# Patient Record
Sex: Female | Born: 1985 | Race: White | Hispanic: No | Marital: Married | Smoking: Former smoker
Health system: Southern US, Community
[De-identification: ages and names within clinical notes are randomized; demographics above are authoritative.]

## PROBLEM LIST (undated history)

## (undated) DIAGNOSIS — Z332 Encounter for elective termination of pregnancy: Secondary | ICD-10-CM

## (undated) DIAGNOSIS — D649 Anemia, unspecified: Secondary | ICD-10-CM

## (undated) DIAGNOSIS — R7303 Prediabetes: Secondary | ICD-10-CM

## (undated) DIAGNOSIS — Z9889 Other specified postprocedural states: Secondary | ICD-10-CM

## (undated) DIAGNOSIS — D6852 Prothrombin gene mutation: Secondary | ICD-10-CM

## (undated) DIAGNOSIS — K589 Irritable bowel syndrome without diarrhea: Secondary | ICD-10-CM

## (undated) DIAGNOSIS — I8290 Acute embolism and thrombosis of unspecified vein: Secondary | ICD-10-CM

## (undated) DIAGNOSIS — R112 Nausea with vomiting, unspecified: Secondary | ICD-10-CM

## (undated) DIAGNOSIS — K760 Fatty (change of) liver, not elsewhere classified: Secondary | ICD-10-CM

## (undated) HISTORY — PX: TONSILLECTOMY AND ADENOIDECTOMY: SUR1326

## (undated) HISTORY — PX: UPPER GI ENDOSCOPY: SHX6162

## (undated) HISTORY — PX: OTHER SURGICAL HISTORY: SHX169

## (undated) HISTORY — PX: COLONOSCOPY: SHX174

## (undated) HISTORY — PX: KNEE ARTHROSCOPY: SUR90

## (undated) HISTORY — PX: HAND SURGERY: SHX662

---

## 2015-03-07 ENCOUNTER — Other Ambulatory Visit: Payer: Self-pay | Admitting: Family Medicine

## 2015-03-07 DIAGNOSIS — R7989 Other specified abnormal findings of blood chemistry: Secondary | ICD-10-CM

## 2015-03-07 DIAGNOSIS — R945 Abnormal results of liver function studies: Principal | ICD-10-CM

## 2015-03-14 ENCOUNTER — Ambulatory Visit
Admission: RE | Admit: 2015-03-14 | Discharge: 2015-03-14 | Disposition: A | Discharge status: Home / Self Care | Payer: 59 | Source: Ambulatory Visit | Attending: Family Medicine | Admitting: Family Medicine

## 2015-03-14 DIAGNOSIS — R7989 Other specified abnormal findings of blood chemistry: Secondary | ICD-10-CM

## 2015-03-14 DIAGNOSIS — R945 Abnormal results of liver function studies: Principal | ICD-10-CM

## 2016-07-02 ENCOUNTER — Other Ambulatory Visit: Payer: Self-pay | Admitting: Family Medicine

## 2016-07-02 DIAGNOSIS — Z8742 Personal history of other diseases of the female genital tract: Secondary | ICD-10-CM

## 2016-07-10 ENCOUNTER — Ambulatory Visit
Admission: RE | Admit: 2016-07-10 | Discharge: 2016-07-10 | Disposition: A | Discharge status: Home / Self Care | Payer: 59 | Source: Ambulatory Visit | Attending: Family Medicine | Admitting: Family Medicine

## 2016-07-10 DIAGNOSIS — Z8742 Personal history of other diseases of the female genital tract: Secondary | ICD-10-CM

## 2016-07-16 ENCOUNTER — Encounter: Payer: Self-pay | Admitting: Hematology

## 2016-07-16 ENCOUNTER — Telehealth: Payer: Self-pay | Admitting: Hematology

## 2016-07-16 NOTE — Telephone Encounter (Signed)
Pt confirmed appt, verified demo and insurance, mailed pt letter, faxed referring provider appt date/time. °

## 2016-07-17 ENCOUNTER — Other Ambulatory Visit: Payer: Self-pay | Admitting: Family Medicine

## 2016-07-17 DIAGNOSIS — N83201 Unspecified ovarian cyst, right side: Secondary | ICD-10-CM

## 2016-08-21 ENCOUNTER — Ambulatory Visit (HOSPITAL_BASED_OUTPATIENT_CLINIC_OR_DEPARTMENT_OTHER): Payer: 59 | Admitting: Hematology

## 2016-08-21 ENCOUNTER — Telehealth: Payer: Self-pay | Admitting: Hematology

## 2016-08-21 ENCOUNTER — Ambulatory Visit (HOSPITAL_BASED_OUTPATIENT_CLINIC_OR_DEPARTMENT_OTHER): Payer: 59

## 2016-08-21 VITALS — BP 136/69 | HR 76 | Temp 98.4°F | Resp 18 | Ht 69.0 in | Wt 290.9 lb

## 2016-08-21 DIAGNOSIS — N920 Excessive and frequent menstruation with regular cycle: Secondary | ICD-10-CM | POA: Diagnosis not present

## 2016-08-21 DIAGNOSIS — D500 Iron deficiency anemia secondary to blood loss (chronic): Secondary | ICD-10-CM | POA: Diagnosis not present

## 2016-08-21 DIAGNOSIS — R58 Hemorrhage, not elsewhere classified: Secondary | ICD-10-CM | POA: Insufficient documentation

## 2016-08-21 DIAGNOSIS — Z862 Personal history of diseases of the blood and blood-forming organs and certain disorders involving the immune mechanism: Secondary | ICD-10-CM | POA: Diagnosis not present

## 2016-08-21 DIAGNOSIS — R5383 Other fatigue: Secondary | ICD-10-CM

## 2016-08-21 DIAGNOSIS — D6852 Prothrombin gene mutation: Secondary | ICD-10-CM

## 2016-08-21 LAB — COMPREHENSIVE METABOLIC PANEL
ALBUMIN: 3.6 g/dL (ref 3.5–5.0)
ALK PHOS: 70 U/L (ref 40–150)
ALT: 87 U/L — AB (ref 0–55)
ANION GAP: 9 meq/L (ref 3–11)
AST: 60 U/L — ABNORMAL HIGH (ref 5–34)
BILIRUBIN TOTAL: 0.54 mg/dL (ref 0.20–1.20)
BUN: 11.7 mg/dL (ref 7.0–26.0)
CALCIUM: 8.7 mg/dL (ref 8.4–10.4)
CO2: 23 meq/L (ref 22–29)
CREATININE: 0.7 mg/dL (ref 0.6–1.1)
Chloride: 108 mEq/L (ref 98–109)
Glucose: 92 mg/dl (ref 70–140)
Potassium: 4 mEq/L (ref 3.5–5.1)
Sodium: 140 mEq/L (ref 136–145)
TOTAL PROTEIN: 6.7 g/dL (ref 6.4–8.3)

## 2016-08-21 LAB — IRON AND TIBC
%SAT: 3 % — ABNORMAL LOW (ref 21–57)
Iron: 14 ug/dL — ABNORMAL LOW (ref 41–142)
TIBC: 459 ug/dL — AB (ref 236–444)
UIBC: 444 ug/dL — AB (ref 120–384)

## 2016-08-21 LAB — CBC & DIFF AND RETIC
BASO%: 0.9 % (ref 0.0–2.0)
Basophils Absolute: 0.1 10*3/uL (ref 0.0–0.1)
EOS ABS: 0.3 10*3/uL (ref 0.0–0.5)
EOS%: 3.7 % (ref 0.0–7.0)
HEMATOCRIT: 22.5 % — AB (ref 34.8–46.6)
HEMOGLOBIN: 7 g/dL — AB (ref 11.6–15.9)
IMMATURE RETIC FRACT: 19.7 % — AB (ref 1.60–10.00)
LYMPH%: 34.9 % (ref 14.0–49.7)
MCH: 23.3 pg — ABNORMAL LOW (ref 25.1–34.0)
MCHC: 31.1 g/dL — ABNORMAL LOW (ref 31.5–36.0)
MCV: 74.8 fL — AB (ref 79.5–101.0)
MONO#: 0.6 10*3/uL (ref 0.1–0.9)
MONO%: 7 % (ref 0.0–14.0)
NEUT%: 53.5 % (ref 38.4–76.8)
NEUTROS ABS: 4.6 10*3/uL (ref 1.5–6.5)
PLATELETS: 285 10*3/uL (ref 145–400)
RBC: 3.01 10*6/uL — ABNORMAL LOW (ref 3.70–5.45)
RDW: 14.2 % (ref 11.2–14.5)
RETIC CT ABS: 85.18 10*3/uL (ref 33.70–90.70)
Retic %: 2.83 % — ABNORMAL HIGH (ref 0.70–2.10)
WBC: 8.6 10*3/uL (ref 3.9–10.3)
lymph#: 3 10*3/uL (ref 0.9–3.3)
nRBC: 0 % (ref 0–0)

## 2016-08-21 LAB — FERRITIN: FERRITIN: 10 ng/mL (ref 9–269)

## 2016-08-21 LAB — TSH: TSH: 1.067 m(IU)/L (ref 0.308–3.960)

## 2016-08-21 NOTE — Telephone Encounter (Signed)
Appointments scheduled per 12/7 LOS. Patient given AVS report and calendars with future scheduled appointments.  °

## 2016-08-22 LAB — VON WILLEBRAND PANEL
FACTOR VIII ACTIVITY: 196 % — AB (ref 57–163)
vWF Activity: 85 % (ref 50–200)
von Willebrand Factor (vWF) Ag: 108 % (ref 50–200)

## 2016-08-22 LAB — APTT: aPTT: 25 s (ref 24–33)

## 2016-08-22 LAB — PROTHROMBIN TIME (PT)
INR: 1.1 (ref 0.8–1.2)
PROTHROMBIN TIME: 12.1 s — AB (ref 9.1–12.0)

## 2016-08-25 ENCOUNTER — Other Ambulatory Visit: Payer: Self-pay | Admitting: *Deleted

## 2016-08-25 DIAGNOSIS — D500 Iron deficiency anemia secondary to blood loss (chronic): Secondary | ICD-10-CM | POA: Insufficient documentation

## 2016-08-25 DIAGNOSIS — D6852 Prothrombin gene mutation: Secondary | ICD-10-CM | POA: Insufficient documentation
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
## 2016-08-29 ENCOUNTER — Ambulatory Visit (HOSPITAL_BASED_OUTPATIENT_CLINIC_OR_DEPARTMENT_OTHER): Payer: 59

## 2016-08-29 VITALS — BP 121/61 | HR 88 | Temp 98.6°F | Resp 18

## 2016-08-29 DIAGNOSIS — D500 Iron deficiency anemia secondary to blood loss (chronic): Secondary | ICD-10-CM

## 2016-08-29 DIAGNOSIS — N920 Excessive and frequent menstruation with regular cycle: Secondary | ICD-10-CM | POA: Diagnosis not present

## 2016-08-29 MED ORDER — SODIUM CHLORIDE 0.9 % IV SOLN
750.0000 mg | Freq: Once | INTRAVENOUS | Status: AC
  Administered 2016-08-29: 750 mg via INTRAVENOUS
  Filled 2016-08-29: qty 15

## 2016-08-29 MED ORDER — SODIUM CHLORIDE 0.9 % IV SOLN
Freq: Once | INTRAVENOUS | Status: AC
  Administered 2016-08-29: 09:00:00 via INTRAVENOUS

## 2016-08-29 NOTE — Patient Instructions (Signed)
Ferric carboxymaltose injection What is this medicine? FERRIC CARBOXYMALTOSE (ferr-ik car-box-ee-mol-toes) is an iron complex. Iron is used to make healthy red blood cells, which carry oxygen and nutrients throughout the body. This medicine is used to treat anemia in people with chronic kidney disease or people who cannot take iron by mouth. COMMON BRAND NAME(S): Injectafer What should I tell my health care provider before I take this medicine? They need to know if you have any of these conditions: -anemia not caused by low iron levels -high levels of iron in the blood -liver disease -an unusual or allergic reaction to iron, other medicines, foods, dyes, or preservatives -pregnant or trying to get pregnant -breast-feeding How should I use this medicine? This medicine is for infusion into a vein. It is given by a health care professional in a hospital or clinic setting. Talk to your pediatrician regarding the use of this medicine in children. Special care may be needed. What if I miss a dose? It is important not to miss your dose. Call your doctor or health care professional if you are unable to keep an appointment. What may interact with this medicine? Do not take this medicine with any of the following medications: -deferoxamine -dimercaprol -other iron products This medicine may also interact with the following medications: -chloramphenicol -deferasirox What should I watch for while using this medicine? Visit your doctor or health care professional regularly. Tell your doctor if your symptoms do not start to get better or if they get worse. You may need blood work done while you are taking this medicine. You may need to follow a special diet. Talk to your doctor. Foods that contain iron include: whole grains/cereals, dried fruits, beans, or peas, leafy green vegetables, and organ meats (liver, kidney). What side effects may I notice from receiving this medicine? Side effects that you  should report to your doctor or health care professional as soon as possible: -allergic reactions like skin rash, itching or hives, swelling of the face, lips, or tongue -breathing problems -changes in blood pressure -feeling faint or lightheaded, falls -flushing, sweating, or hot feelings Side effects that usually do not require medical attention (report to your doctor or health care professional if they continue or are bothersome): -changes in taste -constipation -dizziness -headache -nausea -pain, redness, or irritation at site where injected -vomiting Where should I keep my medicine? This drug is given in a hospital or clinic and will not be stored at home.  2017 Elsevier/Gold Standard (2015-10-04 11:20:47)  

## 2016-08-29 NOTE — Progress Notes (Signed)
Pt tolerated infusion well. Pt monitored for 30 minutes. Pt and VS stable at discharge.

## 2016-09-05 ENCOUNTER — Ambulatory Visit (HOSPITAL_BASED_OUTPATIENT_CLINIC_OR_DEPARTMENT_OTHER): Payer: 59

## 2016-09-05 VITALS — BP 123/64 | HR 79 | Temp 98.9°F | Resp 18

## 2016-09-05 DIAGNOSIS — N920 Excessive and frequent menstruation with regular cycle: Secondary | ICD-10-CM | POA: Diagnosis not present

## 2016-09-05 DIAGNOSIS — D500 Iron deficiency anemia secondary to blood loss (chronic): Secondary | ICD-10-CM

## 2016-09-05 MED ORDER — SODIUM CHLORIDE 0.9 % IV SOLN
Freq: Once | INTRAVENOUS | Status: AC
  Administered 2016-09-05: 09:00:00 via INTRAVENOUS

## 2016-09-05 MED ORDER — SODIUM CHLORIDE 0.9 % IV SOLN
750.0000 mg | Freq: Once | INTRAVENOUS | Status: AC
  Administered 2016-09-05: 750 mg via INTRAVENOUS
  Filled 2016-09-05: qty 15

## 2016-09-05 NOTE — Patient Instructions (Signed)
Ferric carboxymaltose injection What is this medicine? FERRIC CARBOXYMALTOSE (ferr-ik car-box-ee-mol-toes) is an iron complex. Iron is used to make healthy red blood cells, which carry oxygen and nutrients throughout the body. This medicine is used to treat anemia in people with chronic kidney disease or people who cannot take iron by mouth. COMMON BRAND NAME(S): Injectafer What should I tell my health care provider before I take this medicine? They need to know if you have any of these conditions: -anemia not caused by low iron levels -high levels of iron in the blood -liver disease -an unusual or allergic reaction to iron, other medicines, foods, dyes, or preservatives -pregnant or trying to get pregnant -breast-feeding How should I use this medicine? This medicine is for infusion into a vein. It is given by a health care professional in a hospital or clinic setting. Talk to your pediatrician regarding the use of this medicine in children. Special care may be needed. What if I miss a dose? It is important not to miss your dose. Call your doctor or health care professional if you are unable to keep an appointment. What may interact with this medicine? Do not take this medicine with any of the following medications: -deferoxamine -dimercaprol -other iron products This medicine may also interact with the following medications: -chloramphenicol -deferasirox What should I watch for while using this medicine? Visit your doctor or health care professional regularly. Tell your doctor if your symptoms do not start to get better or if they get worse. You may need blood work done while you are taking this medicine. You may need to follow a special diet. Talk to your doctor. Foods that contain iron include: whole grains/cereals, dried fruits, beans, or peas, leafy green vegetables, and organ meats (liver, kidney). What side effects may I notice from receiving this medicine? Side effects that you  should report to your doctor or health care professional as soon as possible: -allergic reactions like skin rash, itching or hives, swelling of the face, lips, or tongue -breathing problems -changes in blood pressure -feeling faint or lightheaded, falls -flushing, sweating, or hot feelings Side effects that usually do not require medical attention (report to your doctor or health care professional if they continue or are bothersome): -changes in taste -constipation -dizziness -headache -nausea -pain, redness, or irritation at site where injected -vomiting Where should I keep my medicine? This drug is given in a hospital or clinic and will not be stored at home.  2017 Elsevier/Gold Standard (2015-10-04 11:20:47)  

## 2016-09-05 NOTE — Progress Notes (Signed)
Pt tolerated infusion well. Pt monitored 30 minutes post infusion. Pt and VS stable at discharge.  

## 2016-09-18 ENCOUNTER — Other Ambulatory Visit: Payer: 59

## 2016-09-23 ENCOUNTER — Other Ambulatory Visit: Payer: 59

## 2016-09-23 ENCOUNTER — Ambulatory Visit: Payer: 59 | Admitting: Hematology

## 2016-11-24 ENCOUNTER — Other Ambulatory Visit: Payer: 59

## 2016-11-26 ENCOUNTER — Other Ambulatory Visit: Payer: 59

## 2016-11-27 ENCOUNTER — Ambulatory Visit
Admission: RE | Admit: 2016-11-27 | Discharge: 2016-11-27 | Disposition: A | Discharge status: Home / Self Care | Payer: Managed Care, Other (non HMO) | Source: Ambulatory Visit | Attending: Family Medicine | Admitting: Family Medicine

## 2016-11-27 DIAGNOSIS — N83201 Unspecified ovarian cyst, right side: Secondary | ICD-10-CM

## 2017-07-16 ENCOUNTER — Encounter (HOSPITAL_COMMUNITY): Payer: Self-pay | Admitting: *Deleted

## 2017-07-20 NOTE — H&P (Addendum)
Leslie Branch is an 31 y.o. female. Here with a MAB (fetus prior had FHT not absent). She has a h/o Prothrombin gene mutation and has been on lovenox. Lovenox was discontinued >5 days ago.   Pertinent Gynecological History: Menses: flow is moderate Bleeding: n/a Contraception: none DES exposure: denies Blood transfusions: none Sexually transmitted diseases: no past history Previous GYN Procedures: n/A  Last pap: normal Date: 2016 OB History: G4, P0   Menstrual History: Menarche age: n/a Patient's last menstrual period was 05/18/2017 (approximate).    Past Medical History:  Diagnosis Date  . Anemia   . Blood clot in vein    right hand - thumb  . IBS (irritable bowel syndrome)   . 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 Termination of pregnancy (fetus)    x 3 in clinics    Past Surgical History:  Procedure Laterality Date  . COLONOSCOPY    . HAND SURGERY Left    pinky - reconstructive  . KNEE ARTHROSCOPY Left   . TONSILLECTOMY AND ADENOIDECTOMY     as a child  . tubes in ears     as a child  . UPPER GI ENDOSCOPY      History reviewed. No pertinent family history.  Social History:  reports that she quit smoking about 6 years ago. Her smoking use included cigarettes. She has a 10.00 pack-year smoking history. she has never used smokeless tobacco. She reports that she does not drink alcohol or use drugs.  Allergies:  Allergies  Allergen Reactions  . Chloraseptic Sore Throat [Acetaminophen] Anaphylaxis    Closed throat when used spray    No medications prior to admission.    ROS  Height 5\' 9"  (1.753 m), weight 122.5 kg (270 lb), last menstrual period 05/18/2017. Physical Exam (from office) Gen: well appearing, NAD CV: Reg rate Pulm: NWOB Abd: soft, nondistended, nontender, no mass GYN: uterus 9 week size, no adnexa ttp/CMT Ext: No edema b/l  No results found for this or any previous visit (from the past 24 hour(s)).  No results  found.  Assessment/Plan: 31 yo G4P0 presenting for MAB @ ~7.2wga. She has a h/o Prothrombin gene mutation with DVT in the past. Had been on lovenox this pregnancy but that was discontinued on 07/16/17. She is RH+. All options were discussed for MAB and pt opted D&E with genetics. Risks discussed including infection, bleeding, damage to surrounding structures, need for additional procedures, and failure of procedure. All questions answered. Consent signed. Proceed with above surgery. Doxycycline 100mg  BID x 3 days. EL   Belva AgeeElise Tahlor Berenguer MD   Madelaine EtienneElise Jennifer Nohelani Benning 07/20/2017, 9:56 AM   No updates to above H&P. Patient arrived NPO and was consented in PACU. Risks discussed including infection, bleeding, damage to surrounding structures, need for additional procedures, and failure of procedure. All questions answered. Consent signed. Proceed with above surgery.   Belva AgeeElise Aubriana Ravelo MD

## 2017-07-22 ENCOUNTER — Ambulatory Visit (HOSPITAL_COMMUNITY): Payer: Managed Care, Other (non HMO) | Admitting: Anesthesiology

## 2017-07-22 ENCOUNTER — Ambulatory Visit (HOSPITAL_COMMUNITY)
Admission: RE | Admit: 2017-07-22 | Discharge: 2017-07-22 | Disposition: A | Discharge status: Home / Self Care | Payer: Managed Care, Other (non HMO) | Source: Ambulatory Visit | Attending: Obstetrics and Gynecology | Admitting: Obstetrics and Gynecology

## 2017-07-22 ENCOUNTER — Encounter (HOSPITAL_COMMUNITY)
Admission: RE | Disposition: A | Discharge status: Home / Self Care | Payer: Self-pay | Source: Ambulatory Visit | Attending: Obstetrics and Gynecology

## 2017-07-22 ENCOUNTER — Encounter (HOSPITAL_COMMUNITY): Payer: Self-pay | Admitting: Emergency Medicine

## 2017-07-22 DIAGNOSIS — O021 Missed abortion: Secondary | ICD-10-CM | POA: Insufficient documentation

## 2017-07-22 DIAGNOSIS — Z7901 Long term (current) use of anticoagulants: Secondary | ICD-10-CM | POA: Diagnosis not present

## 2017-07-22 DIAGNOSIS — Z86718 Personal history of other venous thrombosis and embolism: Secondary | ICD-10-CM | POA: Insufficient documentation

## 2017-07-22 DIAGNOSIS — Z87891 Personal history of nicotine dependence: Secondary | ICD-10-CM | POA: Insufficient documentation

## 2017-07-22 DIAGNOSIS — K589 Irritable bowel syndrome without diarrhea: Secondary | ICD-10-CM | POA: Insufficient documentation

## 2017-07-22 HISTORY — PX: DILATION AND EVACUATION: SHX1459

## 2017-07-22 HISTORY — DX: Encounter for elective termination of pregnancy: Z33.2

## 2017-07-22 HISTORY — DX: Acute embolism and thrombosis of unspecified vein: I82.90

## 2017-07-22 HISTORY — DX: Prothrombin gene mutation: D68.52

## 2017-07-22 HISTORY — DX: Irritable bowel syndrome, unspecified: K58.9

## 2017-07-22 HISTORY — DX: Anemia, unspecified: D64.9

## 2017-07-22 LAB — CBC
HCT: 39.3 % (ref 36.0–46.0)
HEMOGLOBIN: 13.5 g/dL (ref 12.0–15.0)
MCH: 29.3 pg (ref 26.0–34.0)
MCHC: 34.4 g/dL (ref 30.0–36.0)
MCV: 85.4 fL (ref 78.0–100.0)
Platelets: 307 10*3/uL (ref 150–400)
RBC: 4.6 MIL/uL (ref 3.87–5.11)
RDW: 13.2 % (ref 11.5–15.5)
WBC: 9.6 10*3/uL (ref 4.0–10.5)

## 2017-07-22 LAB — TYPE AND SCREEN
ABO/RH(D): O POS
Antibody Screen: NEGATIVE

## 2017-07-22 LAB — ABO/RH: ABO/RH(D): O POS

## 2017-07-22 SURGERY — DILATION AND EVACUATION, UTERUS
Anesthesia: Monitor Anesthesia Care | Site: Vagina

## 2017-07-22 MED ORDER — PROPOFOL 500 MG/50ML IV EMUL
INTRAVENOUS | Status: DC | PRN
  Administered 2017-07-22: 150 ug/kg/min via INTRAVENOUS

## 2017-07-22 MED ORDER — MIDAZOLAM HCL 2 MG/2ML IJ SOLN
INTRAMUSCULAR | Status: AC
  Filled 2017-07-22: qty 2

## 2017-07-22 MED ORDER — MIDAZOLAM HCL 2 MG/2ML IJ SOLN
INTRAMUSCULAR | Status: DC | PRN
  Administered 2017-07-22: 2 mg via INTRAVENOUS

## 2017-07-22 MED ORDER — FENTANYL CITRATE (PF) 100 MCG/2ML IJ SOLN
INTRAMUSCULAR | Status: AC
  Filled 2017-07-22: qty 2

## 2017-07-22 MED ORDER — DOXYCYCLINE HYCLATE 100 MG PO CAPS: 100.0000 mg | ORAL_CAPSULE | Freq: Two times a day (BID) | ORAL | 0 refills | Status: DC

## 2017-07-22 MED ORDER — SCOPOLAMINE 1 MG/3DAYS TD PT72
MEDICATED_PATCH | TRANSDERMAL | Status: AC
  Administered 2017-07-22: 1.5 mg via TRANSDERMAL
  Filled 2017-07-22: qty 1

## 2017-07-22 MED ORDER — SCOPOLAMINE 1 MG/3DAYS TD PT72
1.0000 | MEDICATED_PATCH | Freq: Once | TRANSDERMAL | Status: DC
  Administered 2017-07-22: 1.5 mg via TRANSDERMAL

## 2017-07-22 MED ORDER — PROPOFOL 10 MG/ML IV BOLUS
INTRAVENOUS | Status: AC
  Filled 2017-07-22: qty 40

## 2017-07-22 MED ORDER — ONDANSETRON HCL 4 MG/2ML IJ SOLN
INTRAMUSCULAR | Status: DC | PRN
  Administered 2017-07-22: 4 mg via INTRAVENOUS

## 2017-07-22 MED ORDER — CHLOROPROCAINE HCL 1 % IJ SOLN
INTRAMUSCULAR | Status: AC
  Filled 2017-07-22: qty 30

## 2017-07-22 MED ORDER — LIDOCAINE HCL (CARDIAC) 20 MG/ML IV SOLN
INTRAVENOUS | Status: AC
  Filled 2017-07-22: qty 5

## 2017-07-22 MED ORDER — LIDOCAINE HCL (CARDIAC) 20 MG/ML IV SOLN
INTRAVENOUS | Status: DC | PRN
  Administered 2017-07-22: 60 mg via INTRAVENOUS

## 2017-07-22 MED ORDER — FENTANYL CITRATE (PF) 100 MCG/2ML IJ SOLN
INTRAMUSCULAR | Status: DC | PRN
  Administered 2017-07-22: 100 ug via INTRAVENOUS

## 2017-07-22 MED ORDER — CHLOROPROCAINE HCL 1 % IJ SOLN
INTRAMUSCULAR | Status: DC | PRN
  Administered 2017-07-22: 10 mL

## 2017-07-22 MED ORDER — LACTATED RINGERS IV SOLN
INTRAVENOUS | Status: DC
  Administered 2017-07-22: 07:00:00 via INTRAVENOUS

## 2017-07-22 MED ORDER — DEXAMETHASONE SODIUM PHOSPHATE 4 MG/ML IJ SOLN
INTRAMUSCULAR | Status: AC
  Filled 2017-07-22: qty 1

## 2017-07-22 MED ORDER — OXYCODONE HCL 5 MG PO TABS: 5.0000 mg | ORAL_TABLET | Freq: Four times a day (QID) | ORAL | 0 refills | Status: DC | PRN

## 2017-07-22 MED ORDER — ONDANSETRON HCL 4 MG/2ML IJ SOLN
INTRAMUSCULAR | Status: AC
  Filled 2017-07-22: qty 2

## 2017-07-22 MED ORDER — SCOPOLAMINE 1 MG/3DAYS TD PT72
MEDICATED_PATCH | TRANSDERMAL | Status: AC
  Filled 2017-07-22: qty 1

## 2017-07-22 MED ORDER — DEXAMETHASONE SODIUM PHOSPHATE 10 MG/ML IJ SOLN
INTRAMUSCULAR | Status: DC | PRN
  Administered 2017-07-22: 4 mg via INTRAVENOUS

## 2017-07-22 SURGICAL SUPPLY — 18 items
CATH ROBINSON RED A/P 16FR (CATHETERS) ×3 IMPLANT
DECANTER SPIKE VIAL GLASS SM (MISCELLANEOUS) ×3 IMPLANT
GLOVE BIO SURGEON STRL SZ 6.5 (GLOVE) ×2 IMPLANT
GLOVE BIO SURGEONS STRL SZ 6.5 (GLOVE) ×1
GLOVE BIOGEL PI IND STRL 7.0 (GLOVE) ×2 IMPLANT
GLOVE BIOGEL PI INDICATOR 7.0 (GLOVE) ×4
GOWN STRL REUS W/TWL LRG LVL3 (GOWN DISPOSABLE) ×6 IMPLANT
KIT BERKELEY 1ST TRIMESTER 3/8 (MISCELLANEOUS) ×3 IMPLANT
NS IRRIG 1000ML POUR BTL (IV SOLUTION) ×3 IMPLANT
PACK VAGINAL MINOR WOMEN LF (CUSTOM PROCEDURE TRAY) ×3 IMPLANT
PAD OB MATERNITY 4.3X12.25 (PERSONAL CARE ITEMS) ×3 IMPLANT
PAD PREP 24X48 CUFFED NSTRL (MISCELLANEOUS) ×3 IMPLANT
SET BERKELEY SUCTION TUBING (SUCTIONS) ×3 IMPLANT
TOWEL OR 17X24 6PK STRL BLUE (TOWEL DISPOSABLE) ×6 IMPLANT
VACURETTE 10 RIGID CVD (CANNULA) IMPLANT
VACURETTE 7MM CVD STRL WRAP (CANNULA) ×3 IMPLANT
VACURETTE 8 RIGID CVD (CANNULA) IMPLANT
VACURETTE 9 RIGID CVD (CANNULA) IMPLANT

## 2017-07-22 NOTE — Anesthesia Postprocedure Evaluation (Signed)
Anesthesia Post Note  Patient: Leslie Branch  Procedure(s) Performed: DILATATION AND EVACUATION WITH GENETICS (N/A Vagina )     Patient location during evaluation: PACU Anesthesia Type: MAC Level of consciousness: awake and alert Pain management: pain level controlled Vital Signs Assessment: post-procedure vital signs reviewed and stable Respiratory status: spontaneous breathing, nonlabored ventilation and respiratory function stable Cardiovascular status: stable and blood pressure returned to baseline Postop Assessment: no apparent nausea or vomiting Anesthetic complications: no    Last Vitals:  Vitals:   07/22/17 0820 07/22/17 0900  BP: 139/70 (!) 147/92  Pulse: 86 81  Resp: 19 18  Temp:  36.7 C  SpO2: 97% 100%    Last Pain:  Vitals:   07/22/17 0820  TempSrc:   PainSc: 2    Pain Goal: Patients Stated Pain Goal: 3 (07/22/17 0644)               Catalina Gravel

## 2017-07-22 NOTE — Transfer of Care (Signed)
Immediate Anesthesia Transfer of Care Note  Patient: Leslie Branch  Procedure(s) Performed: DILATATION AND EVACUATION WITH GENETICS (N/A Vagina )  Patient Location: PACU  Anesthesia Type:MAC  Level of Consciousness: awake, alert  and oriented  Airway & Oxygen Therapy: Patient Spontanous Breathing  Post-op Assessment: Report given to RN and Post -op Vital signs reviewed and stable  Post vital signs: Reviewed and stable  Last Vitals:  Vitals:   07/22/17 0644  BP: 137/79  Pulse: 81  Resp: 16  Temp: 36.7 C  SpO2: 100%    Last Pain:  Vitals:   07/22/17 0644  TempSrc: Oral      Patients Stated Pain Goal: 3 (09/81/19 1478)  Complications: No apparent anesthesia complications

## 2017-07-22 NOTE — Op Note (Signed)
PREOPERATIVE DIAGNOSES: 1. Missed Abortion  POSTOPERATIVE DIAGNOSES: Same  PROCEDURE PERFORMED: Dilation, suction, sharp curretage  SURGEON: Dr. Belva AgeeElise Anastacio Bua  ANESTHESIA: Paracervical block and IV sedation  ESTIMATED BLOOD LOSS: 20cc.  COMPLICATIONS: None  TUBES: None.  DRAINS: None  PATHOLOGY: Endometrial curettings for chromosomal analysis  FINDINGS: On exam, under anesthesia, normal appearing vulva and vagina, 8 week sized uterus  Operative findings demonstrated plethora of POCs  Procedure: The patient was taken to the operating room where she was properly prepped and draped in sterile manner under general anesthesia. After bimanual examination, the cervix was exposed with a speculum and the anterior lip of the cervix grasped with a tenaculum. Paracervical block performed. The endocervical canal was then progressively dilated to 7mm. Suction catheter was introduced into the uterus and to the uterine fundus. The uterus was evacuated and good tissue return was noted. A sharp curettage was then performed until gritty texture noted. All instruments were removed from vagina. The sponge and lap counts were correct times 2 at this time. The patient's procedure was terminated. We then awakened her. She was sent to the Recovery Room in good condition.    Belva AgeeElise Blasa Raisch MD

## 2017-07-22 NOTE — Brief Op Note (Signed)
07/22/2017  7:56 AM  PATIENT:  Leslie Branch  31 y.o. female  PRE-OPERATIVE DIAGNOSIS:  MISSED AB  POST-OPERATIVE DIAGNOSIS:  Missed AB  PROCEDURE:  Procedure(s): DILATATION AND EVACUATION WITH GENETICS (N/A)  SURGEON:  Surgeon(s) and Role:    * Ranae PilaLeger, Keiyana Stehr Jennifer, MD - Primary  PHYSICIAN ASSISTANT:   ASSISTANTS: none   ANESTHESIA:   IV sedation and paracervical block  EBL:  30 mL   BLOOD ADMINISTERED:none  DRAINS: none   LOCAL MEDICATIONS USED:  LIDOCAINE  and Amount: 10 ml  SPECIMEN:  Source of Specimen:  POC  DISPOSITION OF SPECIMEN:  PATHOLOGY  COUNTS:  YES  TOURNIQUET:  * No tourniquets in log *  DICTATION: .Note written in EPIC  PLAN OF CARE: Discharge to home after PACU  PATIENT DISPOSITION:  PACU - hemodynamically stable.   Delay start of Pharmacological VTE agent (>24hrs) due to surgical blood loss or risk of bleeding: not applicable

## 2017-07-22 NOTE — Discharge Instructions (Signed)

## 2017-07-22 NOTE — Anesthesia Preprocedure Evaluation (Signed)
Anesthesia Evaluation  Patient identified by MRN, date of birth, ID band Patient awake    Reviewed: Allergy & Precautions, NPO status , Patient's Chart, lab work & pertinent test results  Airway Mallampati: II  TM Distance: >3 FB Neck ROM: Full    Dental  (+) Teeth Intact, Dental Advisory Given   Pulmonary former smoker,    Pulmonary exam normal breath sounds clear to auscultation       Cardiovascular Normal cardiovascular exam Rhythm:Regular Rate:Normal     Neuro/Psych negative neurological ROS  negative psych ROS   GI/Hepatic negative GI ROS, Neg liver ROS,   Endo/Other  Obesity   Renal/GU negative Renal ROS     Musculoskeletal Blood clot in vein right thumb   Abdominal   Peds  Hematology Prothrombin gene mutation   Anesthesia Other Findings Day of surgery medications reviewed with the patient.  Reproductive/Obstetrics 31 yo G4P0 presenting for MAB @ ~7.2wga                             Anesthesia Physical Anesthesia Plan  ASA: II  Anesthesia Plan: MAC   Post-op Pain Management:    Induction: Intravenous  PONV Risk Score and Plan: 2  Airway Management Planned: Simple Face Mask  Additional Equipment:   Intra-op Plan:   Post-operative Plan:   Informed Consent: I have reviewed the patients History and Physical, chart, labs and discussed the procedure including the risks, benefits and alternatives for the proposed anesthesia with the patient or authorized representative who has indicated his/her understanding and acceptance.   Dental advisory given  Plan Discussed with: CRNA  Anesthesia Plan Comments: (Discussed risks/benefits/alternatives to MAC sedation including need for ventilatory support, hypotension, need for conversion to general anesthesia.  All patient questions answered.  Patient/guardian wishes to proceed.)        Anesthesia Quick Evaluation

## 2017-07-23 ENCOUNTER — Encounter (HOSPITAL_COMMUNITY): Payer: Self-pay | Admitting: Obstetrics and Gynecology

## 2017-08-04 LAB — CHROMOSOME STD, POC(TISSUE)-NCBH

## 2017-12-02 ENCOUNTER — Ambulatory Visit: Payer: Self-pay

## 2017-12-02 ENCOUNTER — Ambulatory Visit (INDEPENDENT_AMBULATORY_CARE_PROVIDER_SITE_OTHER): Payer: Managed Care, Other (non HMO) | Admitting: Sports Medicine

## 2017-12-02 ENCOUNTER — Encounter: Payer: Self-pay | Admitting: Sports Medicine

## 2017-12-02 VITALS — BP 120/84 | HR 80 | Ht 69.0 in | Wt 288.4 lb

## 2017-12-02 DIAGNOSIS — M722 Plantar fascial fibromatosis: Secondary | ICD-10-CM | POA: Insufficient documentation

## 2017-12-02 DIAGNOSIS — M79672 Pain in left foot: Secondary | ICD-10-CM

## 2017-12-02 MED ORDER — IBUPROFEN-FAMOTIDINE 800-26.6 MG PO TABS: 1.0000 | ORAL_TABLET | Freq: Three times a day (TID) | ORAL | 2 refills | Status: DC | PRN

## 2017-12-02 MED ORDER — IBUPROFEN-FAMOTIDINE 800-26.6 MG PO TABS: 1.0000 | ORAL_TABLET | Freq: Three times a day (TID) | ORAL | 0 refills | Status: AC | PRN

## 2017-12-02 NOTE — Progress Notes (Signed)
  Leslie ShySarah Holsonback - 32 y.o. female MRN [id number]  Date of birth: 12/20/1985  Scribe for today's visit: Stevenson ClinchBrandy Coleman, CMA     SUBJECTIVE:  Leslie Branch is here for No chief complaint on file.  Her L heel pain symptoms INITIALLY: Began a couple of weeks ago and started after a long run.  Described as moderate (6-7/10) stabbing, feels like a bruise, nonradiating Worsened with weightbearing. Initially the pain worse only after a long run, then is started hurting after any run, and now the pain is always there.  Improved with rest, as long as nothing is touching her heel.  Additional associated symptoms include: pain is causing her to limp. She did notice some pain at the L great toe. She does notice that her shoes are tighter after a run.     At this time symptoms are worsening compared to onset, increased frequency and severity.  She has been taking IBU with some relief. She has tried icing the heel with some relief. She does have pain with icing if she puts too much pressure on the heel.    ROS Reports night time disturbances. Denies fevers, chills, or night sweats. Denies unexplained weight loss. Denies personal history of cancer. Denies changes in bowel or bladder habits. Denies recent unreported falls. Denies new or worsening dyspnea or wheezing. Denies headaches or dizziness.  Denies numbness, tingling or weakness  In the extremities.  Denies dizziness or presyncopal episodes Reports lower extremity edema      Please see additional documentation for Objective, Assessment and Plan sections. Pertinent additional documentation may be included in corresponding procedure notes, imaging studies, problem based documentation and patient instructions. Please see these sections of the encounter for additional information regarding this visit.  CMA/ATC served as Neurosurgeonscribe during this visit. History, Physical, and Plan performed by medical provider. Documentation and orders reviewed and  attested to.      Andrena MewsMichael D Brysin Towery, DO    [place] [facility] Physician

## 2017-12-02 NOTE — Progress Notes (Signed)
Leslie Branch. [facility] at [facility] [phone]  Leslie Branch - 32 y.o. female MRN [id number]  Date of birth: 1986/02/11  Visit Date:   PCP: Lewis Moccasin, MD   Referred by: Lewis Moccasin, MD  Please see additional documentation for HPI, review of systems.  HISTORY & PERTINENT PRIOR DATA:  Prior History reviewed and updated per electronic medical record.  Significant history, findings, studies and interim changes include:  reports that she quit smoking about 7 years ago. Her smoking use included cigarettes. She has a 10.00 pack-year smoking history. she has never used smokeless tobacco. No results for input(s): HGBA1C, LABURIC, CREATINE in the last 8760 hours. No specialty comments available. Problem  Pain of Left Heel    OBJECTIVE:  VS:  HT:5\' 9"  (175.3 cm)   WT:288 lb 6.4 oz (130.8 kg)  BMI:42.57    BP:120/84  HR:80bpm  TEMP: ( )  RESP:98 %   PHYSICAL EXAM: WDWN, Non-toxic appearing. Psychiatric: Alert & appropriately interactive.  Not depressed or anxious appearing. Respiratory: No increased work of breathing.  Trachea Midline Eyes: Pupils are equal.  EOM intact without nystagmus.  No scleral icterus  NEUROVASCULAR exam: No clubbing or cyanosis appreciated No significant venous stasis changes normal, less than 2 seconds  SPECIALITY TESTING:  Pain with calcaneal squeeze and over the plantar surface of the calcaneous  equinus contracture to 85degrees calf is otherwise supple Ankle has good intrinsic plantarflexion, dorsiflexion, inversion and eversion strength. DP PT pulses are 2+/4.     ASSESSMENT & PLAN:   1. Pain of left heel    Orders & Meds:  Orders Placed This Encounter  Procedures  . [place] MSK POCT ULTRASOUND     Meds ordered this encounter  Medications  . DISCONTD: Ibuprofen-Famotidine (DUEXIS) 800-26.6 MG TABS    Sig: Take 1 tablet by mouth 3 (three) times daily as needed. 1 tab po  tid X 14 days then 1 tab po tid as needed    Dispense:  90 tablet    Refill:  2    Home Phone      [phone] Mobile          [phone]   . Ibuprofen-Famotidine (DUEXIS) 800-26.6 MG TABS    Sig: Take 1 tablet by mouth 3 (three) times daily as needed for up to 1 day.    Dispense:  9 tablet    Refill:  0  . Ibuprofen-Famotidine (DUEXIS) 800-26.6 MG TABS    Sig: Take 1 tablet by mouth 3 (three) times daily as needed. 1 tab po tid X 14 days then 1 tab po tid as needed    Dispense:  90 tablet    Refill:  2    Home Phone      [phone] Mobile          [phone]     PLAN: I am concerned for potential calcaneal stress reaction but doubt regressed into a fracture at this point given the moderate nature of the pain however we did discuss that continued running at the level that she is likely to significantly worsen the pain and potentially result in a full fracture versus displaced fracture.  If any lack of improvement with 5 days of rest and slow return to activity further diagnostic evaluation with MRI will plan to obtain x-rays at follow-up if any persistent pain especially with calcaneal squeeze test.   Therapeutic exercises including Alfredson's per AVS.   No  problem-specific Assessment & Plan notes found for this encounter.   Follow-up: Return in about 2 weeks (around 12/16/2017).

## 2017-12-02 NOTE — Procedures (Signed)
LIMITED MSK ULTRASOUND OF Left foot Images were obtained and interpreted by myself, Gaspar BiddingMichael Rigby, DO  Images have been saved and stored to PACS system. Images obtained on: GE S7 Ultrasound machine  FINDINGS:   minimal edema and swelling of the insertion of the Achilles tendon.  Achilles tendon itself pathic.  Plantar fascia has a small amount of increased swelling in the long arch with no true plantar fasciitis.  Longitudinal and transverse scans of the calcaneus were performed that did not show any obvious periosteal reaction or increased neovascularity  IMPRESSION:  1. possible cortical irregularity of calcaneous without obvious signs of stress fracture but cannot fully rule out

## 2017-12-16 ENCOUNTER — Ambulatory Visit (INDEPENDENT_AMBULATORY_CARE_PROVIDER_SITE_OTHER): Payer: Managed Care, Other (non HMO)

## 2017-12-16 ENCOUNTER — Ambulatory Visit (INDEPENDENT_AMBULATORY_CARE_PROVIDER_SITE_OTHER): Payer: Managed Care, Other (non HMO) | Admitting: Sports Medicine

## 2017-12-16 ENCOUNTER — Encounter: Payer: Self-pay | Admitting: Sports Medicine

## 2017-12-16 VITALS — BP 140/92 | HR 72 | Ht 69.0 in | Wt 290.6 lb

## 2017-12-16 DIAGNOSIS — M79672 Pain in left foot: Secondary | ICD-10-CM

## 2017-12-16 NOTE — Procedures (Signed)
[facility] Interpreted by myself [name](Pablo Mathurin D Afomia Blackley, DO) during office visit.  Results were reviewed with the patient at the time of the visit.   2 VIEW X-RAY of: Left calcaneus  FINDINGS:  Overall well aligned hindfoot with normal-appearing cortical margins.  No significant degenerative changes of the ankle or hindfoot appreciated on these limited views. IMPRESSION:  Nondiagnostic calcaneal x-rays

## 2017-12-16 NOTE — Progress Notes (Signed)
Leslie FellsMichael D. Delorise [name], DO  [place] [facility] [phone]  Leslie Branch Leslie Branch - 32 y.o. female MRN [id number]  Date of birth: 1985-12-17  Visit Date: 12/16/2017  PCP: Lewis Moccasinewey, Elizabeth R, MD   Referred by: Lewis Moccasinewey, Elizabeth R, MD  Scribe for today's visit: Fabio PierceMolly A Weber PT, LAT, ATC  SUBJECTIVE:  Leslie Branch [place] is here for Follow-up (L heel pain)  Notes from initial visit on 12/02/17: Her L heel pain symptoms INITIALLY: Began a couple of weeks ago and started after a long run.  Described as moderate (6-7/10) stabbing, feels like a bruise, nonradiating Worsened with weightbearing. Initially the pain worse only after a long run, then is started hurting after any run, and now the pain is always there.  Improved with rest, as long as nothing is touching her heel.  Additional associated symptoms include: pain is causing her to limp. She did notice some pain at the L great toe. She does notice that her shoes are tighter after a run.     At this time symptoms are worsening compared to onset, increased frequency and severity.  She has been taking IBU with some relief. She has tried icing the heel with some relief. She does have pain with icing if she puts too much pressure on the heel.   12/16/2017: Compared to the last office visit on 12/02/17, her previously described L heel pain symptoms are improving w/ slightly less pain.  She states that she has stopped running since her last visit and is walking now for exercise. Current symptoms are moderate & are nonradiating She has been taking Duexis and icing.  ROS Denies night time disturbances. Denies fevers, chills, or night sweats. Denies unexplained weight loss. Denies personal history of cancer. Denies changes in bowel or bladder habits. Denies recent unreported falls. Denies new or worsening dyspnea or wheezing. Denies headaches or dizziness.  Denies numbness, tingling or weakness  In the  extremities.  Denies dizziness or presyncopal episodes Denies lower extremity edema    HISTORY & PERTINENT PRIOR DATA:  Prior History reviewed and updated per electronic medical record.  Significant/pertinent history, findings, studies include:  reports that she quit smoking about 7 years ago. Her smoking use included cigarettes. She has a 10.00 pack-year smoking history. She has never used smokeless tobacco. No results for input(s): HGBA1C, LABURIC, CREATINE in the last 8760 hours. No specialty comments available. No problems updated.  OBJECTIVE:  VS:  HT:5\' 9"  (175.3 cm)   WT:290 lb 9.6 oz (131.8 kg)  BMI:42.89    BP:(Abnormal) 140/92  HR:72bpm  TEMP: ( )  RESP:97 %   PHYSICAL EXAM: Constitutional: WDWN, Non-toxic appearing. Psychiatric: Alert & appropriately interactive.  Not depressed or anxious appearing. Respiratory: No increased work of breathing.  Trachea Midline Eyes: Pupils are equal.  EOM intact without nystagmus.  No scleral icterus  Vascular Exam: warm to touch no edema  lower extremity neuro exam: unremarkable  MSK Exam: Normal-appearing foot and ankle with significant pain with calcaneal squeeze.  No significant pain over the Achilles tendon itself but most focally with weightbearing and with calcaneal squeeze.  Mild pain over the plantar fascia.  Mild pain over the navicular.   ASSESSMENT & PLAN:   1. Pain of left heel     PLAN:  She is continue to have persistent pain within the left heel including after relative activity modification.  X-rays obtained today do not show any obvious stress fracture but given the persistent symptoms  further diagnostic evaluation with MRI indicated.  Boot immobilization discussed.   Follow-up: Return for MRI results review.     Please see additional documentation for Objective, Assessment and Plan sections. Pertinent additional documentation may be included in corresponding procedure notes, imaging studies, problem based  documentation and patient instructions. Please see these sections of the encounter for additional information regarding this visit.  CMA/ATC served as Neurosurgeon during this visit. History, Physical, and Plan performed by medical provider. Documentation and orders reviewed and attested to.      Leslie Mews, DO    [place] [facility] Physician

## 2017-12-18 ENCOUNTER — Other Ambulatory Visit: Payer: Self-pay | Admitting: Sports Medicine

## 2017-12-19 ENCOUNTER — Other Ambulatory Visit: Payer: Managed Care, Other (non HMO)

## 2017-12-20 ENCOUNTER — Inpatient Hospital Stay: Admission: RE | Admit: 2017-12-20 | Payer: Managed Care, Other (non HMO) | Source: Ambulatory Visit

## 2017-12-22 ENCOUNTER — Telehealth: Payer: Self-pay | Admitting: Sports Medicine

## 2017-12-22 NOTE — Telephone Encounter (Signed)
Copied from CRM [phone]. Topic: Quick Communication - See Telephone Encounter >> Dec 22, 2017 11:27 AM Diana EvesHoyt, Maryann B wrote: CRM for notification. See Telephone encounter for: 12/22/17. Pt was unable to have MRI done on Sunday due to not being Pre Approved. [place] [facility] is needing the doctors office to contact them to get the MRI approved so pt can be rescheduled.

## 2017-12-22 NOTE — Telephone Encounter (Signed)
Spoke with patient and advised that MRI is under clinical review and we should know something by the end of the business day.

## 2017-12-23 NOTE — Telephone Encounter (Signed)
MRI coverage has been denied by insurance. Please advise of next steps.

## 2017-12-25 ENCOUNTER — Ambulatory Visit: Payer: Managed Care, Other (non HMO) | Admitting: Sports Medicine

## 2017-12-28 NOTE — Telephone Encounter (Signed)
Pt is following up on what she should do next. (since MRI denied) Pt is wearing the boot, and does not know when to return or what the next steps for her will be, or how long she should wear this boot.

## 2018-01-06 NOTE — Telephone Encounter (Signed)
Called pt and left VM to call the office.  

## 2018-01-06 NOTE — Telephone Encounter (Signed)
Should continue boot until I can see her back next week

## 2018-01-06 NOTE — Telephone Encounter (Signed)
Talked to pt and scheduled her for a f/u w/ Dr. Berline Choughigby next week.

## 2018-01-15 ENCOUNTER — Ambulatory Visit: Payer: Managed Care, Other (non HMO) | Admitting: Sports Medicine

## 2018-01-18 ENCOUNTER — Ambulatory Visit: Payer: Managed Care, Other (non HMO) | Admitting: Sports Medicine

## 2018-01-18 ENCOUNTER — Encounter: Payer: Self-pay | Admitting: Family Medicine

## 2018-01-18 ENCOUNTER — Ambulatory Visit (INDEPENDENT_AMBULATORY_CARE_PROVIDER_SITE_OTHER): Payer: Managed Care, Other (non HMO) | Admitting: Family Medicine

## 2018-01-18 ENCOUNTER — Ambulatory Visit: Payer: Self-pay

## 2018-01-18 VITALS — BP 124/66 | HR 86 | Ht 69.0 in | Wt 280.0 lb

## 2018-01-18 DIAGNOSIS — M79672 Pain in left foot: Secondary | ICD-10-CM | POA: Diagnosis not present

## 2018-01-18 NOTE — Progress Notes (Signed)
Leslie Branch - 32 y.o. female MRN [id number]  Date of birth: 1986-08-12  SUBJECTIVE:  Including CC & ROS.  Chief Complaint  Patient presents with  . Left heel pain    Leslie Branch is a 32 y.o. female that is presenting with left heel pain. Pain started in January when she was increasing her mileage with running. She noticed the pain when she runs.  She had been running 10 miles a week training for a half marathon. She has been in an CAM walker for one month, she was diagnosed with achilles tendonitis.  She wears the CAM walker daily other than taking it off to exercise. Pain is located on the plantar heel and medial midfoot. Pain has improved since wearing the CAM walker. Pain is constant, more severe when she taking a step. She has been keeping it elevated and applying ice. Denies surgery.  Review of xrays of her left heel from 4/3 show no fracture    Review of Systems  Constitutional: Negative for fever.  HENT: Negative for congestion.   Respiratory: Negative for cough.   Cardiovascular: Negative for chest pain.  Gastrointestinal: Negative for abdominal pain.  Musculoskeletal: Positive for gait problem.  Skin: Negative for color change.  Neurological: Negative for weakness.  Hematological: Negative for adenopathy.  Psychiatric/Behavioral: Negative for agitation.    HISTORY: Past Medical, Surgical, Social, and Family History Reviewed & Updated per EMR.   Pertinent Historical Findings include:  Past Medical History:  Diagnosis Date  . Anemia   . Blood clot in vein    right hand - thumb  . IBS (irritable bowel syndrome)   . 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 Termination of pregnancy (fetus)    x 3 in clinics    Past Surgical History:  Procedure Laterality Date  . COLONOSCOPY    . DILATION AND EVACUATION N/A 07/22/2017   Procedure: DILATATION AND EVACUATION WITH GENETICS;  Surgeon: Ranae Pila, MD;  Location: [facility];  Service: Gynecology;   Laterality: N/A;  . HAND SURGERY Left    pinky - reconstructive  . KNEE ARTHROSCOPY Left   . TONSILLECTOMY AND ADENOIDECTOMY     as a child  . tubes in ears     as a child  . UPPER GI ENDOSCOPY      Allergies  Allergen Reactions  . Chloraseptic Sore Throat [Acetaminophen] Anaphylaxis    Closed throat when used spray    No family history on file.   Social History   Socioeconomic History  . Marital status: Married    Spouse name: Not on file  . Number of children: Not on file  . Years of education: Not on file  . Highest education level: Not on file  Occupational History  . Not on file  Social Needs  . Financial resource strain: Not on file  . Food insecurity:    Worry: Not on file    Inability: Not on file  . Transportation needs:    Medical: Not on file    Non-medical: Not on file  Tobacco Use  . Smoking status: Former Smoker    Packs/day: 1.00    Years: 10.00    Pack years: 10.00    Types: Cigarettes    Last attempt to quit: 09/15/2010    Years since quitting: 7.3  . Smokeless tobacco: Never Used  Substance and Sexual Activity  . Alcohol use: No  . Drug use: No  . Sexual activity: Yes  Birth control/protection: None    Comment: approx [redacted] wks gestation per patient   Lifestyle  . Physical activity:    Days per week: Not on file    Minutes per session: Not on file  . Stress: Not on file  Relationships  . Social connections:    Talks on phone: Not on file    Gets together: Not on file    Attends religious service: Not on file    Active member of club or organization: Not on file    Attends meetings of clubs or organizations: Not on file    Relationship status: Not on file  . Intimate partner violence:    Fear of current or ex partner: Not on file    Emotionally abused: Not on file    Physically abused: Not on file    Forced sexual activity: Not on file  Other Topics Concern  . Not on file  Social History Narrative  . Not on file     PHYSICAL  EXAM:  VS: BP 124/66 (BP Location: Left Arm, Patient Position: Sitting)   Pulse 86   Ht  (1.753 m)   Wt 280 lb (127 kg)   SpO2 98%   BMI 41.35 kg/m  Physical Exam Gen: NAD, alert, cooperative with exam, well-appearing ENT: normal lips, normal nasal mucosa,  Eye: normal EOM, normal conjunctiva and lids CV:  no edema, +2 pedal pulses   Resp: no accessory muscle use, non-labored,  GI: no masses or tenderness, no hernia  Skin: no rashes, no areas of induration  Neuro: normal tone, normal sensation to touch Psych:  normal insight, alert and oriented MSK:  Left foot:  No abnormal callus or ulcer  TTP of the plantar calcaneus  Normal ankle ROM  Normal strength to resistance  Mild TTP at the navicular  Neurovascularly intact   Limited ultrasound: left foot:  Mild effusion of distal posterior tib just prior to insertion at navicular  Normal appearing achilles  Normal appearing plantar fascia   Summary: mild posterior tib tenonitis otherwise normal   Ultrasound and interpretation by Clare Gandy, MD      ASSESSMENT & PLAN:   Pain of left heel Less likely for plantar fasciitis as there is no swelling ultrasound.  Posterior tib with mild irritation.  Pain still occurring at the calcaneus despite being in a cam walker for 4 weeks. Suggest a stress fracture with ongoing pain  - MRI left foot  - try to wean out of the boot. Consoled on continued use  - counseled on supportive care

## 2018-01-18 NOTE — Patient Instructions (Signed)
Nice to meet you.  We will schedule the MRI  Try to come out of the boot. You can try running as long as three isn't excessive pain  Please use and elevation.

## 2018-01-18 NOTE — Assessment & Plan Note (Signed)
Less likely for plantar fasciitis as there is no swelling ultrasound.  Posterior tib with mild irritation.  Pain still occurring at the calcaneus despite being in a cam walker for 4 weeks. Suggest a stress fracture with ongoing pain  - MRI left foot  - try to wean out of the boot. Consoled on continued use  - counseled on supportive care

## 2018-01-21 ENCOUNTER — Ambulatory Visit: Payer: Managed Care, Other (non HMO) | Admitting: Sports Medicine

## 2018-01-21 ENCOUNTER — Encounter: Payer: Self-pay | Admitting: Sports Medicine

## 2018-02-06 ENCOUNTER — Ambulatory Visit
Admission: RE | Admit: 2018-02-06 | Discharge: 2018-02-06 | Disposition: A | Discharge status: Home / Self Care | Payer: Managed Care, Other (non HMO) | Source: Ambulatory Visit | Attending: Family Medicine | Admitting: Family Medicine

## 2018-02-06 DIAGNOSIS — M79672 Pain in left foot: Secondary | ICD-10-CM

## 2018-02-09 ENCOUNTER — Telehealth: Payer: Self-pay | Admitting: Family Medicine

## 2018-02-09 NOTE — Telephone Encounter (Signed)
Left VM for patient. 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 We could try some physical therapy to help with this. Could try nitro patches. May benefit from custom orthotics if wanting to continue to run. The [facility] can report results to patient.   If any questions then please take the best time and phone number to call and I will try to call her back.   Myra Rude, MD [facility] 02/09/2018, 8:07 AM

## 2018-02-23 NOTE — Progress Notes (Signed)
Leslie Branch - 32 y.o. female MRN [id number]  Date of birth: 1986/04/30  SUBJECTIVE:  Including CC & ROS.  Chief Complaint  Patient presents with  . Foot Orthotics    Leslie Branch is a 32 y.o. female that is here today follow up of her left heel pain.  She was treated with a cam walker for at least 4 weeks.  There was concern that she had a stress fracture.  Having pain in the plantar aspect of her heel.  Pain is worse after running.  The pain is localized to the bottom of her foot.  The pain is moderate to severe in nature.  She has not tried any medications thus far.  Denies any injury.  She is still planning on running a half marathon this fall.  She feels the pain is throbbing in nature.  Is planning on getting a new pair shoes.  Has tried over-the-counter orthotics with no improvement..  Independent review of the left foot MRI from 5/25 shows thickening of the plantar fascia to suggest a partial tear.    Review of Systems  Constitutional: Negative for fever.  HENT: Negative for congestion.   Respiratory: Negative for cough.   Cardiovascular: Negative for chest pain.  Gastrointestinal: Negative for abdominal pain.  Musculoskeletal: Negative for joint swelling.  Skin: Negative for color change.  Allergic/Immunologic: Negative for immunocompromised state.  Neurological: Negative for weakness.  Hematological: Negative for adenopathy.    HISTORY: Past Medical, Surgical, Social, and Family History Reviewed & Updated per EMR.   Pertinent Historical Findings include:  Past Medical History:  Diagnosis Date  . Anemia   . Blood clot in vein    right hand - thumb  . IBS (irritable bowel syndrome)   . 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 Termination of pregnancy (fetus)    x 3 in clinics    Past Surgical History:  Procedure Laterality Date  . COLONOSCOPY    . DILATION AND EVACUATION N/A 07/22/2017   Procedure: DILATATION AND EVACUATION WITH GENETICS;  Surgeon:  Ranae Pila, MD;  Location: [facility];  Service: Gynecology;  Laterality: N/A;  . HAND SURGERY Left    pinky - reconstructive  . KNEE ARTHROSCOPY Left   . TONSILLECTOMY AND ADENOIDECTOMY     as a child  . tubes in ears     as a child  . UPPER GI ENDOSCOPY      Allergies  Allergen Reactions  . Chloraseptic Sore Throat [Acetaminophen] Anaphylaxis    Closed throat when used spray    No family history on file.   Social History   Socioeconomic History  . Marital status: Married    Spouse name: Not on file  . Number of children: Not on file  . Years of education: Not on file  . Highest education level: Not on file  Occupational History  . Not on file  Social Needs  . Financial resource strain: Not on file  . Food insecurity:    Worry: Not on file    Inability: Not on file  . Transportation needs:    Medical: Not on file    Non-medical: Not on file  Tobacco Use  . Smoking status: Former Smoker    Packs/day: 1.00    Years: 10.00    Pack years: 10.00    Types: Cigarettes    Last attempt to quit: 09/15/2010    Years since quitting: 7.4  . Smokeless tobacco: Never Used  Substance and Sexual Activity  . Alcohol use: No  . Drug use: No  . Sexual activity: Yes    Birth control/protection: None    Comment: approx [redacted] wks gestation per patient   Lifestyle  . Physical activity:    Days per week: Not on file    Minutes per session: Not on file  . Stress: Not on file  Relationships  . Social connections:    Talks on phone: Not on file    Gets together: Not on file    Attends religious service: Not on file    Active member of club or organization: Not on file    Attends meetings of clubs or organizations: Not on file    Relationship status: Not on file  . Intimate partner violence:    Fear of current or ex partner: Not on file    Emotionally abused: Not on file    Physically abused: Not on file    Forced sexual activity: Not on file  Other Topics Concern  . Not  on file  Social History Narrative  . Not on file     PHYSICAL EXAM:  VS: There were no vitals taken for this visit. Physical Exam Gen: NAD, alert, cooperative with exam, well-appearing ENT: normal lips, normal nasal mucosa,  Eye: normal EOM, normal conjunctiva and lids CV:  no edema, +2 pedal pulses   Resp: no accessory muscle use, non-labored,  GI: no masses or tenderness, no hernia  Skin: no rashes, no areas of induration  Neuro: normal tone, normal sensation to touch Psych:  normal insight, alert and oriented MSK:  Left foot:  TTP of the plantar aspect of the left heel  Normal Ankle AROM  Normal to inspection  Mild stiffness of subtalar joint  Neurovascularly intact   Limited ultrasound: Left foot:  Normal-appearing posterior tibialis at the tarsal tunnel Significant thickening of the plantar fascia to suggest a partial tear versus plantar fasciitis.  Summary: Plantar fasciitis  Ultrasound and interpretation by Clare GandyJeremy Schmitz, MD     Aspiration/Injection Procedure Note Effie ShySarah Zieske 11-18-1985  Procedure: Injection Indications: Left foot pain  Procedure Details Consent: Risks of procedure as well as the alternatives and risks of each were explained to the (patient/caregiver).  Consent for procedure obtained. Time Out: Verified patient identification, verified procedure, site/side was marked, verified correct patient position, special equipment/implants available, medications/allergies/relevent history reviewed, required imaging and test results available.  Performed.  The area was cleaned with iodine and alcohol swabs.    The left plantar fascia was injected using 1 cc's of 40 mg Depomedrol and 4 cc's of 1% lidocaine with a 25 1 1/2" needle.  Ultrasound was used. Images were obtained in Transverse and Long views showing the injection.    A sterile dressing was applied.  Patient did tolerate procedure well.     ASSESSMENT & PLAN:   Plantar fasciitis of left  foot Still trying to complete a half this fall. Thickening or a partial tear of PF seen on MRI and US.  - injection today  - advised to obtain midfoot arch strap  - counseled on HEP  - would still make custom orthotics if she is still trying to run her race.

## 2018-02-24 ENCOUNTER — Ambulatory Visit (INDEPENDENT_AMBULATORY_CARE_PROVIDER_SITE_OTHER): Payer: Managed Care, Other (non HMO)

## 2018-02-24 ENCOUNTER — Ambulatory Visit (INDEPENDENT_AMBULATORY_CARE_PROVIDER_SITE_OTHER): Payer: Managed Care, Other (non HMO) | Admitting: Family Medicine

## 2018-02-24 DIAGNOSIS — M79672 Pain in left foot: Secondary | ICD-10-CM | POA: Diagnosis not present

## 2018-02-24 DIAGNOSIS — M722 Plantar fascial fibromatosis: Secondary | ICD-10-CM

## 2018-02-24 NOTE — Patient Instructions (Signed)
Good to see you  Please try the exercises  Please bring your running shoes so we can make the orthotics later.

## 2018-02-24 NOTE — Assessment & Plan Note (Signed)
Still trying to complete a half this fall. Thickening or a partial tear of PF seen on MRI and US.  - injection today  - advised to obtain midfoot arch strap  - counseled on HEP  - would still make custom orthotics if she is still trying to run her race.

## 2018-03-01 ENCOUNTER — Ambulatory Visit: Payer: Managed Care, Other (non HMO) | Admitting: Family Medicine

## 2018-03-08 ENCOUNTER — Ambulatory Visit (INDEPENDENT_AMBULATORY_CARE_PROVIDER_SITE_OTHER): Payer: Managed Care, Other (non HMO) | Admitting: Family Medicine

## 2018-03-08 DIAGNOSIS — M722 Plantar fascial fibromatosis: Secondary | ICD-10-CM

## 2018-03-08 NOTE — Assessment & Plan Note (Signed)
Thickened plantar fascia on MRI.   Patient was fitted for a standard, cushioned, semi-rigid orthotic. The orthotic was heated and afterward the patient stood on the orthotic blank positioned on the orthotic stand. The patient was positioned in subtalar neutral position and 10 degrees of ankle dorsiflexion in a weight bearing stance. After completion of molding, a stable base was applied to the orthotic blank. The blank was ground to a stable position for weight bearing. Size: 10 Base: Blue EVA Additional Posting and Padding: None The patient ambulated these, and they were very comfortable.

## 2018-03-08 NOTE — Progress Notes (Signed)
Leslie ShySarah Galas - 32 y.o. female MRN [id number]  Date of birth: 1986-05-22  SUBJECTIVE:  Including CC & ROS.  No chief complaint on file.   Leslie Branch is a 32 y.o. female that is here for custom orthotics. Has been getting treated for plantar fasciitis on the left foot.   Review of Systems  HISTORY: Past Medical, Surgical, Social, and Family History Reviewed & Updated per EMR.   Pertinent Historical Findings include:  Past Medical History:  Diagnosis Date  . Anemia   . Blood clot in vein    right hand - thumb  . IBS (irritable bowel syndrome)   . 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 Termination of pregnancy (fetus)    x 3 in clinics    Past Surgical History:  Procedure Laterality Date  . COLONOSCOPY    . DILATION AND EVACUATION N/A 07/22/2017   Procedure: DILATATION AND EVACUATION WITH GENETICS;  Surgeon: Ranae PilaLeger, Elise Jennifer, MD;  Location: [facility];  Service: Gynecology;  Laterality: N/A;  . HAND SURGERY Left    pinky - reconstructive  . KNEE ARTHROSCOPY Left   . TONSILLECTOMY AND ADENOIDECTOMY     as a child  . tubes in ears     as a child  . UPPER GI ENDOSCOPY      Allergies  Allergen Reactions  . Chloraseptic Sore Throat [Acetaminophen] Anaphylaxis    Closed throat when used spray    No family history on file.   Social History   Socioeconomic History  . Marital status: Married    Spouse name: Not on file  . Number of children: Not on file  . Years of education: Not on file  . Highest education level: Not on file  Occupational History  . Not on file  Social Needs  . Financial resource strain: Not on file  . Food insecurity:    Worry: Not on file    Inability: Not on file  . Transportation needs:    Medical: Not on file    Non-medical: Not on file  Tobacco Use  . Smoking status: Former Smoker    Packs/day: 1.00    Years: 10.00    Pack years: 10.00    Types: Cigarettes    Last attempt to quit: 09/15/2010    Years since quitting:  7.4  . Smokeless tobacco: Never Used  Substance and Sexual Activity  . Alcohol use: No  . Drug use: No  . Sexual activity: Yes    Birth control/protection: None    Comment: approx [redacted] wks gestation per patient   Lifestyle  . Physical activity:    Days per week: Not on file    Minutes per session: Not on file  . Stress: Not on file  Relationships  . Social connections:    Talks on phone: Not on file    Gets together: Not on file    Attends religious service: Not on file    Active member of club or organization: Not on file    Attends meetings of clubs or organizations: Not on file    Relationship status: Not on file  . Intimate partner violence:    Fear of current or ex partner: Not on file    Emotionally abused: Not on file    Physically abused: Not on file    Forced sexual activity: Not on file  Other Topics Concern  . Not on file  Social History Narrative  . Not on file  PHYSICAL EXAM:  VS: There were no vitals taken for this visit. Physical Exam Gen: NAD, alert, cooperative with exam, well-appearing       ASSESSMENT & PLAN:   Plantar fasciitis of left foot Thickened plantar fascia on MRI.   Patient was fitted for a standard, cushioned, semi-rigid orthotic. The orthotic was heated and afterward the patient stood on the orthotic blank positioned on the orthotic stand. The patient was positioned in subtalar neutral position and 10 degrees of ankle dorsiflexion in a weight bearing stance. After completion of molding, a stable base was applied to the orthotic blank. The blank was ground to a stable position for weight bearing. Size: 10 Base: Blue EVA Additional Posting and Padding: None The patient ambulated these, and they were very comfortable.

## 2018-05-05 LAB — OB RESULTS CONSOLE HEPATITIS B SURFACE ANTIGEN: HEP B S AG: NEGATIVE

## 2018-05-05 LAB — OB RESULTS CONSOLE RPR: RPR: NONREACTIVE

## 2018-05-05 LAB — OB RESULTS CONSOLE RUBELLA ANTIBODY, IGM: RUBELLA: NON-IMMUNE/NOT IMMUNE

## 2018-05-05 LAB — OB RESULTS CONSOLE HIV ANTIBODY (ROUTINE TESTING): HIV: NONREACTIVE

## 2018-05-10 ENCOUNTER — Telehealth: Payer: Self-pay | Admitting: Hematology

## 2018-05-10 NOTE — Telephone Encounter (Signed)
Scheduled appt per 8/23 sch message - left message for pt with appt date and time.

## 2018-05-12 LAB — OB RESULTS CONSOLE GC/CHLAMYDIA
Chlamydia: NEGATIVE
Gonorrhea: NEGATIVE

## 2018-05-27 ENCOUNTER — Inpatient Hospital Stay: Payer: Managed Care, Other (non HMO)

## 2018-05-27 ENCOUNTER — Telehealth: Payer: Self-pay | Admitting: Hematology

## 2018-05-27 ENCOUNTER — Inpatient Hospital Stay: Payer: Managed Care, Other (non HMO) | Attending: Hematology | Admitting: Hematology

## 2018-05-27 VITALS — BP 143/55 | HR 86 | Temp 98.2°F | Resp 18 | Ht 69.0 in | Wt 288.6 lb

## 2018-05-27 DIAGNOSIS — Z86718 Personal history of other venous thrombosis and embolism: Secondary | ICD-10-CM | POA: Diagnosis not present

## 2018-05-27 DIAGNOSIS — N96 Recurrent pregnancy loss: Secondary | ICD-10-CM | POA: Diagnosis not present

## 2018-05-27 DIAGNOSIS — Z87891 Personal history of nicotine dependence: Secondary | ICD-10-CM | POA: Insufficient documentation

## 2018-05-27 DIAGNOSIS — D500 Iron deficiency anemia secondary to blood loss (chronic): Secondary | ICD-10-CM

## 2018-05-27 DIAGNOSIS — D6852 Prothrombin gene mutation: Secondary | ICD-10-CM | POA: Diagnosis present

## 2018-05-27 LAB — IRON AND TIBC
IRON: 64 ug/dL (ref 41–142)
Saturation Ratios: 17 % — ABNORMAL LOW (ref 21–57)
TIBC: 380 ug/dL (ref 236–444)
UIBC: 316 ug/dL

## 2018-05-27 LAB — CBC WITH DIFFERENTIAL/PLATELET
BASOS ABS: 0 10*3/uL (ref 0.0–0.1)
BASOS PCT: 0 %
EOS PCT: 3 %
Eosinophils Absolute: 0.3 10*3/uL (ref 0.0–0.5)
HCT: 37.1 % (ref 34.8–46.6)
Hemoglobin: 12.7 g/dL (ref 11.6–15.9)
Lymphocytes Relative: 27 %
Lymphs Abs: 3 10*3/uL (ref 0.9–3.3)
MCH: 29.5 pg (ref 25.1–34.0)
MCHC: 34.2 g/dL (ref 31.5–36.0)
MCV: 86.1 fL (ref 79.5–101.0)
MONO ABS: 0.7 10*3/uL (ref 0.1–0.9)
Monocytes Relative: 6 %
NEUTROS ABS: 6.9 10*3/uL — AB (ref 1.5–6.5)
Neutrophils Relative %: 64 %
Platelets: 285 10*3/uL (ref 145–400)
RBC: 4.31 MIL/uL (ref 3.70–5.45)
RDW: 13.2 % (ref 11.2–14.5)
WBC: 11 10*3/uL — ABNORMAL HIGH (ref 3.9–10.3)

## 2018-05-27 LAB — CMP (CANCER CENTER ONLY)
ALBUMIN: 3.5 g/dL (ref 3.5–5.0)
ALT: 18 U/L (ref 0–44)
AST: 13 U/L — AB (ref 15–41)
Alkaline Phosphatase: 61 U/L (ref 38–126)
Anion gap: 10 (ref 5–15)
BUN: 11 mg/dL (ref 6–20)
CO2: 22 mmol/L (ref 22–32)
Calcium: 9.2 mg/dL (ref 8.9–10.3)
Chloride: 106 mmol/L (ref 98–111)
Creatinine: 0.67 mg/dL (ref 0.44–1.00)
GFR, Est AFR Am: >60 mL/min (ref >60)
GFR, Estimated: >60 mL/min (ref >60)
GLUCOSE: 78 mg/dL (ref 70–99)
POTASSIUM: 3.9 mmol/L (ref 3.5–5.1)
Sodium: 138 mmol/L (ref 135–145)
Total Bilirubin: 0.4 mg/dL (ref 0.3–1.2)
Total Protein: 7 g/dL (ref 6.5–8.1)

## 2018-05-27 LAB — FERRITIN: FERRITIN: 68 ng/mL (ref 11–307)

## 2018-05-27 MED ORDER — ENOXAPARIN SODIUM 60 MG/0.6ML ~~LOC~~ SOLN: 60.0000 mg | SUBCUTANEOUS | 3 refills | Status: DC

## 2018-05-27 NOTE — Telephone Encounter (Signed)
Scheduled appt per 9/12 los - pt aware of appts.

## 2018-05-27 NOTE — Patient Instructions (Signed)
Stop 81mg  aspirin when you begin 60mg  Lovenox
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
## 2018-06-03 ENCOUNTER — Encounter: Payer: Managed Care, Other (non HMO) | Admitting: Hematology

## 2018-06-23 ENCOUNTER — Other Ambulatory Visit: Payer: Self-pay

## 2018-06-30 ENCOUNTER — Other Ambulatory Visit (HOSPITAL_COMMUNITY): Payer: Self-pay | Admitting: Obstetrics & Gynecology

## 2018-06-30 DIAGNOSIS — O289 Unspecified abnormal findings on antenatal screening of mother: Secondary | ICD-10-CM

## 2018-07-01 ENCOUNTER — Other Ambulatory Visit: Payer: Self-pay

## 2018-07-02 ENCOUNTER — Other Ambulatory Visit (HOSPITAL_COMMUNITY): Payer: Self-pay | Admitting: *Deleted

## 2018-07-02 ENCOUNTER — Ambulatory Visit (HOSPITAL_COMMUNITY)
Admission: RE | Admit: 2018-07-02 | Discharge: 2018-07-02 | Disposition: A | Discharge status: Home / Self Care | Payer: Managed Care, Other (non HMO) | Source: Ambulatory Visit | Attending: Obstetrics & Gynecology | Admitting: Obstetrics & Gynecology

## 2018-07-02 ENCOUNTER — Other Ambulatory Visit (HOSPITAL_COMMUNITY): Payer: Self-pay | Admitting: Obstetrics & Gynecology

## 2018-07-02 ENCOUNTER — Encounter (HOSPITAL_COMMUNITY): Payer: Self-pay | Admitting: *Deleted

## 2018-07-02 DIAGNOSIS — Z362 Encounter for other antenatal screening follow-up: Secondary | ICD-10-CM

## 2018-07-02 DIAGNOSIS — O3510X0 Maternal care for (suspected) chromosomal abnormality in fetus, unspecified, not applicable or unspecified: Secondary | ICD-10-CM | POA: Diagnosis present

## 2018-07-02 DIAGNOSIS — O99112 Other diseases of the blood and blood-forming organs and certain disorders involving the immune mechanism complicating pregnancy, second trimester: Secondary | ICD-10-CM | POA: Diagnosis not present

## 2018-07-02 DIAGNOSIS — O289 Unspecified abnormal findings on antenatal screening of mother: Secondary | ICD-10-CM

## 2018-07-02 DIAGNOSIS — Z363 Encounter for antenatal screening for malformations: Secondary | ICD-10-CM | POA: Insufficient documentation

## 2018-07-02 DIAGNOSIS — Z3A17 17 weeks gestation of pregnancy: Secondary | ICD-10-CM | POA: Diagnosis not present

## 2018-07-02 DIAGNOSIS — O351XX0 Maternal care for (suspected) chromosomal abnormality in fetus, not applicable or unspecified: Secondary | ICD-10-CM

## 2018-07-02 DIAGNOSIS — D6852 Prothrombin gene mutation: Secondary | ICD-10-CM | POA: Diagnosis not present

## 2018-07-02 DIAGNOSIS — O99212 Obesity complicating pregnancy, second trimester: Secondary | ICD-10-CM | POA: Diagnosis not present

## 2018-07-02 DIAGNOSIS — O281 Abnormal biochemical finding on antenatal screening of mother: Secondary | ICD-10-CM

## 2018-07-02 NOTE — Consult Note (Signed)
Leslie Branch is a32 yo Caucasian female, G 5 P 0040 LMP 03/04/18 EDC 12/09/18 now @ 17 1/7 weeks seen in consultation as requested secondary to:   1) Insufficient fetal fraction   PREVIOUS OBSTETRICAL HISTORY:  1) Age 89 - First trimester ETOP without complications 2) Age 23 - - First trimester ETOP without complications 3) Age 85 - - First trimester ETOP without complications 4) 2018 - First trimester spontaneous abortion treated with D&C without complications     PREVIOUS GYN HISTORY:   Abnormal PAP - neg   GC - neg   Chlamydia - neg    Syphilis - neg   CAT - 12 x 28-30 x 7-9   Contraception - none    PREVIOUS MEDICAL HISTORY:  DM - neg   HTN - neg   Asthma - neg   Thyroid - neg   Rheumatic Fever - neg    Heart - neg   Lung - neg   Liver - neg   Kidney - neg   Epilepsy - neg    TB - neg   Herpes - neg   UTI - neg   2012 -Prothrombin gene mutation 2012 - DVT    PREVIOUS SURGICAL HISTORY:  1) As above 2) Age 48 - T&A and myringotomy tube placement  3) 2012 - Arthroscopic knee repair without complications 4) 2016 - Hand surgery without complications    MEDICATIONS:  Prenatal Vitamins, Lovenox 60 mg SQ QD, K+, Magnesium       ALLERGIES/REACTIONS:  Chloraseptic spray - anaphylaxis      HABITS:  Smoking - neg   Drinking - neg   Drugs - neg    PSYCHOSOCIAL:  Married              PROFESSION:  Accountant    FAMILY HISTORY:  DM - F, S   HTN - M   Twins - Patient   Stillborns -neg    Birth Defects - neg   Mental Retardation - neg   Blood Dyscrasias - neg   Anesthesia Complications - neg    Genetic - neg          General counseling was then performed for reduced fetal fraction found while attempting NIPT.  Although this has been associated with maternal obesity, it has also been associated with chromosomal abnormalities as high as 8-9%.  The risks and benefits of amniocentesis including a 1/350 risk of pregnancy loss per amnio as well as management options were  explained.   Results based on maternal serum screening are gestationally age dependent for accurate risk assessment.  80 % of fetuses with Trisomy 21 will have some marker for Trisomy 21 on U/S but 20 % will not.  Amniocentesis desired.   IMPRESSIONS:  1) Insufficient fetal fraction 2) Maternal Prothrombin mutation 3) Morbid obesity (not discussed) - BMI - 44.07    RECOMMENDATIONS:  1) Amniocentesis 2) F/U U./S in 4 weeks to complete fetal anatomy 3) Serial U/S every 4 weeks for fetal growth 4) Weekly BPP beginning @ 36 weeks              40 minutes spent in face-to-face consultation with greater than 50% of the time spent in counseling.    Thank you for utilizing our ultrasound and consultative services.  If I may be of any further service, please do not hesitate to contact me.  Sincerely,   Patsi Sears, MD Maternal-Fetal Medicine   Copy of report sent to practitioner/clinic.

## 2018-07-09 ENCOUNTER — Other Ambulatory Visit (HOSPITAL_COMMUNITY): Payer: Self-pay | Admitting: Obstetrics and Gynecology

## 2018-07-09 ENCOUNTER — Ambulatory Visit (HOSPITAL_COMMUNITY)
Admission: RE | Admit: 2018-07-09 | Discharge: 2018-07-09 | Disposition: A | Discharge status: Home / Self Care | Payer: Managed Care, Other (non HMO) | Source: Ambulatory Visit | Attending: Obstetrics & Gynecology | Admitting: Obstetrics & Gynecology

## 2018-07-09 ENCOUNTER — Encounter (HOSPITAL_COMMUNITY): Payer: Self-pay

## 2018-07-09 DIAGNOSIS — O289 Unspecified abnormal findings on antenatal screening of mother: Secondary | ICD-10-CM | POA: Diagnosis present

## 2018-07-09 DIAGNOSIS — O99212 Obesity complicating pregnancy, second trimester: Secondary | ICD-10-CM | POA: Diagnosis not present

## 2018-07-09 DIAGNOSIS — Z362 Encounter for other antenatal screening follow-up: Secondary | ICD-10-CM | POA: Diagnosis not present

## 2018-07-09 DIAGNOSIS — Z3A18 18 weeks gestation of pregnancy: Secondary | ICD-10-CM

## 2018-07-09 DIAGNOSIS — O2692 Pregnancy related conditions, unspecified, second trimester: Secondary | ICD-10-CM

## 2018-07-12 ENCOUNTER — Telehealth (HOSPITAL_COMMUNITY): Payer: Self-pay | Admitting: *Deleted

## 2018-07-12 NOTE — Telephone Encounter (Signed)
Name and DOB verified, results from amnio given.  Normal female by [facility].  Pt glad to hear.  Chromosome microarray pending.  Pt voiced understanding.

## 2018-07-14 ENCOUNTER — Other Ambulatory Visit (HOSPITAL_COMMUNITY): Payer: Self-pay

## 2018-07-21 ENCOUNTER — Other Ambulatory Visit (HOSPITAL_COMMUNITY): Payer: Self-pay

## 2018-07-23 ENCOUNTER — Ambulatory Visit: Payer: Managed Care, Other (non HMO) | Admitting: Hematology

## 2018-07-23 ENCOUNTER — Other Ambulatory Visit: Payer: Managed Care, Other (non HMO)

## 2018-07-28 ENCOUNTER — Other Ambulatory Visit (HOSPITAL_COMMUNITY): Payer: Self-pay | Admitting: Obstetrics and Gynecology

## 2018-07-30 ENCOUNTER — Other Ambulatory Visit (HOSPITAL_COMMUNITY): Payer: Self-pay | Admitting: *Deleted

## 2018-07-30 ENCOUNTER — Ambulatory Visit (HOSPITAL_COMMUNITY)
Admission: RE | Admit: 2018-07-30 | Discharge: 2018-07-30 | Disposition: A | Discharge status: Home / Self Care | Payer: Managed Care, Other (non HMO) | Source: Ambulatory Visit | Attending: Obstetrics & Gynecology | Admitting: Obstetrics & Gynecology

## 2018-07-30 ENCOUNTER — Encounter (HOSPITAL_COMMUNITY): Payer: Self-pay

## 2018-07-30 DIAGNOSIS — Z362 Encounter for other antenatal screening follow-up: Secondary | ICD-10-CM | POA: Insufficient documentation

## 2018-07-30 DIAGNOSIS — Z3A21 21 weeks gestation of pregnancy: Secondary | ICD-10-CM | POA: Diagnosis not present

## 2018-07-30 DIAGNOSIS — O99212 Obesity complicating pregnancy, second trimester: Secondary | ICD-10-CM

## 2018-07-30 DIAGNOSIS — Z86718 Personal history of other venous thrombosis and embolism: Secondary | ICD-10-CM | POA: Diagnosis not present

## 2018-07-30 DIAGNOSIS — O281 Abnormal biochemical finding on antenatal screening of mother: Secondary | ICD-10-CM | POA: Diagnosis not present

## 2018-08-09 ENCOUNTER — Encounter: Payer: Self-pay | Admitting: Hematology

## 2018-08-09 ENCOUNTER — Inpatient Hospital Stay: Payer: Managed Care, Other (non HMO) | Attending: Hematology

## 2018-08-09 ENCOUNTER — Telehealth: Payer: Self-pay | Admitting: *Deleted

## 2018-08-09 ENCOUNTER — Inpatient Hospital Stay (HOSPITAL_BASED_OUTPATIENT_CLINIC_OR_DEPARTMENT_OTHER): Payer: Managed Care, Other (non HMO) | Admitting: Hematology

## 2018-08-09 ENCOUNTER — Telehealth: Payer: Self-pay

## 2018-08-09 ENCOUNTER — Other Ambulatory Visit: Payer: Self-pay | Admitting: Hematology

## 2018-08-09 VITALS — BP 140/81 | HR 88 | Temp 98.3°F | Resp 20 | Ht 69.0 in | Wt 294.1 lb

## 2018-08-09 DIAGNOSIS — D500 Iron deficiency anemia secondary to blood loss (chronic): Secondary | ICD-10-CM | POA: Diagnosis present

## 2018-08-09 DIAGNOSIS — N96 Recurrent pregnancy loss: Secondary | ICD-10-CM

## 2018-08-09 DIAGNOSIS — Z87891 Personal history of nicotine dependence: Secondary | ICD-10-CM | POA: Insufficient documentation

## 2018-08-09 DIAGNOSIS — Z79899 Other long term (current) drug therapy: Secondary | ICD-10-CM | POA: Insufficient documentation

## 2018-08-09 DIAGNOSIS — N920 Excessive and frequent menstruation with regular cycle: Secondary | ICD-10-CM | POA: Insufficient documentation

## 2018-08-09 DIAGNOSIS — D6852 Prothrombin gene mutation: Secondary | ICD-10-CM | POA: Insufficient documentation

## 2018-08-09 DIAGNOSIS — Z7901 Long term (current) use of anticoagulants: Secondary | ICD-10-CM | POA: Diagnosis not present

## 2018-08-09 DIAGNOSIS — Z8759 Personal history of other complications of pregnancy, childbirth and the puerperium: Secondary | ICD-10-CM

## 2018-08-09 LAB — CMP (CANCER CENTER ONLY)
ALK PHOS: 54 U/L (ref 38–126)
ALT: 12 U/L (ref 0–44)
ANION GAP: 11 (ref 5–15)
AST: 10 U/L — ABNORMAL LOW (ref 15–41)
Albumin: 3.1 g/dL — ABNORMAL LOW (ref 3.5–5.0)
BUN: 7 mg/dL (ref 6–20)
CALCIUM: 9 mg/dL (ref 8.9–10.3)
CO2: 22 mmol/L (ref 22–32)
CREATININE: 0.67 mg/dL (ref 0.44–1.00)
Chloride: 106 mmol/L (ref 98–111)
Glucose, Bld: 118 mg/dL — ABNORMAL HIGH (ref 70–99)
Potassium: 3.9 mmol/L (ref 3.5–5.1)
Sodium: 139 mmol/L (ref 135–145)
TOTAL PROTEIN: 6.5 g/dL (ref 6.5–8.1)
Total Bilirubin: 0.3 mg/dL (ref 0.3–1.2)

## 2018-08-09 LAB — IRON AND TIBC
IRON: 72 ug/dL (ref 41–142)
Saturation Ratios: 17 % — ABNORMAL LOW (ref 21–57)
TIBC: 428 ug/dL (ref 236–444)
UIBC: 355 ug/dL (ref 120–384)

## 2018-08-09 LAB — CBC WITH DIFFERENTIAL/PLATELET
ABS IMMATURE GRANULOCYTES: 0.06 10*3/uL (ref 0.00–0.07)
BASOS PCT: 0 %
Basophils Absolute: 0.1 10*3/uL (ref 0.0–0.1)
EOS PCT: 3 %
Eosinophils Absolute: 0.4 10*3/uL (ref 0.0–0.5)
HCT: 35.5 % — ABNORMAL LOW (ref 36.0–46.0)
HEMOGLOBIN: 12.3 g/dL (ref 12.0–15.0)
Immature Granulocytes: 0 %
LYMPHS PCT: 21 %
Lymphs Abs: 2.8 10*3/uL (ref 0.7–4.0)
MCH: 29.7 pg (ref 26.0–34.0)
MCHC: 34.6 g/dL (ref 30.0–36.0)
MCV: 85.7 fL (ref 80.0–100.0)
MONO ABS: 0.8 10*3/uL (ref 0.1–1.0)
MONOS PCT: 6 %
NEUTROS ABS: 9.3 10*3/uL — AB (ref 1.7–7.7)
Neutrophils Relative %: 70 %
Platelets: 255 10*3/uL (ref 150–400)
RBC: 4.14 MIL/uL (ref 3.87–5.11)
RDW: 13.2 % (ref 11.5–15.5)
WBC: 13.4 10*3/uL — AB (ref 4.0–10.5)
nRBC: 0 % (ref 0.0–0.2)

## 2018-08-09 LAB — FERRITIN: FERRITIN: 30 ng/mL (ref 11–307)

## 2018-08-09 MED ORDER — ENOXAPARIN SODIUM 60 MG/0.6ML ~~LOC~~ SOLN: 60.0000 mg | SUBCUTANEOUS | 3 refills | Status: DC

## 2018-08-09 NOTE — Telephone Encounter (Signed)
Printed avs and calender of upcoming appointment. Per 11/25 los 

## 2018-08-09 NOTE — Telephone Encounter (Signed)
[facility] called - 18 ml of Enoxaparen ordered, use 0.6 per day - Should patient be on this for 30 days?

## 2018-08-09 NOTE — Patient Instructions (Signed)
Thank you for choosing [facility] to provide your oncology and hematology care.  To afford each patient quality time with our providers, please arrive 30 minutes before your scheduled appointment time.  If you arrive late for your appointment, you may be asked to reschedule.  We strive to give you quality time with our providers, and arriving late affects you and other patients whose appointments are after yours.    If you are a no show for multiple scheduled visits, you may be dismissed from the clinic at the providers discretion.     Again, thank you for choosing [facility], our hope is that these requests will decrease the amount of time that you wait before being seen by our physicians.  ______________________________________________________________________   Should you have questions after your visit to the [facility], please contact our office at [phone] between the hours of 8:30 and 4:30 p.m.    Voicemails left after 4:30p.m will not be returned until the following business day.     For prescription refill requests, please have your pharmacy contact us directly.  Please also try to allow 48 hours for prescription requests.     Please contact the scheduling department for questions regarding scheduling.  For scheduling of procedures such as PET scans, CT scans, MRI, Ultrasound, etc please contact central scheduling at [phone].     Resources For Cancer Patients and Caregivers:    Oncolink.org:  A wonderful resource for patients and healthcare providers for information regarding your disease, ways to tract your treatment, what to expect, etc.      American Cancer Society:  800-227-2345  Can help patients locate various types of support and financial assistance   Cancer Care: 1-800-813-HOPE (4673) Provides financial assistance, online support groups, medication/co-pay assistance.     [place] DSS:  336-641-3447 Where to apply  for food stamps, Medicaid, and utility assistance   Medicare Rights Center: 800-333-4114 Helps people with Medicare understand their rights and benefits, navigate the Medicare system, and secure the quality healthcare they deserve   SCAT: 336-333-6589 [place] [employer]'s shared-ride transportation service for eligible riders who have a disability that prevents them from riding the fixed route bus.     For additional information on assistance programs please contact our social worker:   Abigail Elmore:  [phone]  
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
## 2018-08-30 ENCOUNTER — Encounter (HOSPITAL_COMMUNITY): Payer: Self-pay

## 2018-08-30 ENCOUNTER — Ambulatory Visit (HOSPITAL_COMMUNITY)
Admission: RE | Admit: 2018-08-30 | Discharge: 2018-08-30 | Disposition: A | Discharge status: Home / Self Care | Payer: Managed Care, Other (non HMO) | Source: Ambulatory Visit | Attending: Obstetrics & Gynecology | Admitting: Obstetrics & Gynecology

## 2018-08-30 DIAGNOSIS — Z3A25 25 weeks gestation of pregnancy: Secondary | ICD-10-CM | POA: Diagnosis not present

## 2018-08-30 DIAGNOSIS — O289 Unspecified abnormal findings on antenatal screening of mother: Secondary | ICD-10-CM | POA: Insufficient documentation

## 2018-08-30 DIAGNOSIS — Z362 Encounter for other antenatal screening follow-up: Secondary | ICD-10-CM | POA: Diagnosis present

## 2018-08-30 DIAGNOSIS — O2692 Pregnancy related conditions, unspecified, second trimester: Secondary | ICD-10-CM | POA: Diagnosis not present

## 2018-08-30 DIAGNOSIS — O99212 Obesity complicating pregnancy, second trimester: Secondary | ICD-10-CM | POA: Diagnosis not present

## 2018-09-15 NOTE — L&D Delivery Note (Signed)
Delivery Note At 8:31 PM a viable female was delivered via Vaginal, Spontaneous (Presentation: LOA).  APGAR: 9, 9; weight pending.   Placenta status: delivered spontaneously and completely.  Cord: three vessels with the following complications: n/a. NICU MD present at birth for thick meconium and Dr. Mora Appl present for late and variable decels. Dr. Mora Appl suctioned baby with bulb syringe prior to his first cry.   Anesthesia:   Episiotomy: None Lacerations: 2nd degree Suture Repair: 3.0 vicryl Est. Blood Loss (mL): 237  Mom to postpartum.  Baby to Couplet care / Skin to Skin.  Janeece Riggers 12/02/2018, 9:23 PM
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
## 2018-11-01 ENCOUNTER — Inpatient Hospital Stay: Payer: Managed Care, Other (non HMO)

## 2018-11-01 ENCOUNTER — Telehealth: Payer: Self-pay | Admitting: Hematology

## 2018-11-01 ENCOUNTER — Inpatient Hospital Stay: Payer: Managed Care, Other (non HMO) | Attending: Hematology | Admitting: Hematology

## 2018-11-01 VITALS — BP 117/73 | HR 82 | Temp 97.8°F | Resp 18 | Ht 69.0 in | Wt 310.0 lb

## 2018-11-01 DIAGNOSIS — D500 Iron deficiency anemia secondary to blood loss (chronic): Secondary | ICD-10-CM

## 2018-11-01 DIAGNOSIS — O99013 Anemia complicating pregnancy, third trimester: Secondary | ICD-10-CM | POA: Insufficient documentation

## 2018-11-01 DIAGNOSIS — Z86718 Personal history of other venous thrombosis and embolism: Secondary | ICD-10-CM

## 2018-11-01 DIAGNOSIS — Z87891 Personal history of nicotine dependence: Secondary | ICD-10-CM

## 2018-11-01 DIAGNOSIS — D6852 Prothrombin gene mutation: Secondary | ICD-10-CM | POA: Diagnosis not present

## 2018-11-01 DIAGNOSIS — N96 Recurrent pregnancy loss: Secondary | ICD-10-CM

## 2018-11-01 DIAGNOSIS — N920 Excessive and frequent menstruation with regular cycle: Secondary | ICD-10-CM | POA: Diagnosis not present

## 2018-11-01 DIAGNOSIS — Z7901 Long term (current) use of anticoagulants: Secondary | ICD-10-CM

## 2018-11-01 DIAGNOSIS — D508 Other iron deficiency anemias: Secondary | ICD-10-CM | POA: Insufficient documentation

## 2018-11-01 DIAGNOSIS — Z8759 Personal history of other complications of pregnancy, childbirth and the puerperium: Secondary | ICD-10-CM

## 2018-11-01 LAB — CBC WITH DIFFERENTIAL/PLATELET
Abs Immature Granulocytes: 0.08 10*3/uL — ABNORMAL HIGH (ref 0.00–0.07)
BASOS ABS: 0.1 10*3/uL (ref 0.0–0.1)
Basophils Relative: 1 %
EOS ABS: 0.4 10*3/uL (ref 0.0–0.5)
EOS PCT: 3 %
HCT: 32.8 % — ABNORMAL LOW (ref 36.0–46.0)
Hemoglobin: 11 g/dL — ABNORMAL LOW (ref 12.0–15.0)
IMMATURE GRANULOCYTES: 1 %
Lymphocytes Relative: 20 %
Lymphs Abs: 2.5 10*3/uL (ref 0.7–4.0)
MCH: 28.2 pg (ref 26.0–34.0)
MCHC: 33.5 g/dL (ref 30.0–36.0)
MCV: 84.1 fL (ref 80.0–100.0)
Monocytes Absolute: 1 10*3/uL (ref 0.1–1.0)
Monocytes Relative: 8 %
NEUTROS PCT: 67 %
NRBC: 0 % (ref 0.0–0.2)
Neutro Abs: 8.7 10*3/uL — ABNORMAL HIGH (ref 1.7–7.7)
PLATELETS: 232 10*3/uL (ref 150–400)
RBC: 3.9 MIL/uL (ref 3.87–5.11)
RDW: 12.7 % (ref 11.5–15.5)
WBC: 12.7 10*3/uL — AB (ref 4.0–10.5)

## 2018-11-01 LAB — CMP (CANCER CENTER ONLY)
ALK PHOS: 75 U/L (ref 38–126)
ALT: 11 U/L (ref 0–44)
ANION GAP: 10 (ref 5–15)
AST: 10 U/L — ABNORMAL LOW (ref 15–41)
Albumin: 2.6 g/dL — ABNORMAL LOW (ref 3.5–5.0)
BUN: 8 mg/dL (ref 6–20)
CALCIUM: 8.5 mg/dL — AB (ref 8.9–10.3)
CHLORIDE: 106 mmol/L (ref 98–111)
CO2: 21 mmol/L — ABNORMAL LOW (ref 22–32)
CREATININE: 0.65 mg/dL (ref 0.44–1.00)
Glucose, Bld: 120 mg/dL — ABNORMAL HIGH (ref 70–99)
Potassium: 4.1 mmol/L (ref 3.5–5.1)
SODIUM: 137 mmol/L (ref 135–145)
Total Bilirubin: 0.2 mg/dL — ABNORMAL LOW (ref 0.3–1.2)
Total Protein: 6.2 g/dL — ABNORMAL LOW (ref 6.5–8.1)

## 2018-11-01 MED ORDER — POLYSACCHARIDE IRON COMPLEX 150 MG PO CAPS: 150.0000 mg | ORAL_CAPSULE | Freq: Every day | ORAL | 3 refills | Status: DC

## 2018-11-01 NOTE — Patient Instructions (Signed)
Thank you for choosing [facility] to provide your oncology and hematology care.  To afford each patient quality time with our providers, please arrive 30 minutes before your scheduled appointment time.  If you arrive late for your appointment, you may be asked to reschedule.  We strive to give you quality time with our providers, and arriving late affects you and other patients whose appointments are after yours.    If you are a no show for multiple scheduled visits, you may be dismissed from the clinic at the providers discretion.     Again, thank you for choosing [facility], our hope is that these requests will decrease the amount of time that you wait before being seen by our physicians.  ______________________________________________________________________   Should you have questions after your visit to the [facility], please contact our office at [phone] between the hours of 8:30 and 4:30 p.m.    Voicemails left after 4:30p.m will not be returned until the following business day.     For prescription refill requests, please have your pharmacy contact us directly.  Please also try to allow 48 hours for prescription requests.     Please contact the scheduling department for questions regarding scheduling.  For scheduling of procedures such as PET scans, CT scans, MRI, Ultrasound, etc please contact central scheduling at [phone].     Resources For Cancer Patients and Caregivers:    Oncolink.org:  A wonderful resource for patients and healthcare providers for information regarding your disease, ways to tract your treatment, what to expect, etc.      American Cancer Society:  800-227-2345  Can help patients locate various types of support and financial assistance   Cancer Care: 1-800-813-HOPE (4673) Provides financial assistance, online support groups, medication/co-pay assistance.     [place] DSS:  336-641-3447 Where to apply  for food stamps, Medicaid, and utility assistance   Medicare Rights Center: 800-333-4114 Helps people with Medicare understand their rights and benefits, navigate the Medicare system, and secure the quality healthcare they deserve   SCAT: 336-333-6589 [employer]'s shared-ride transportation service for eligible riders who have a disability that prevents them from riding the fixed route bus.     For additional information on assistance programs please contact our social worker:   Abigail Elmore:  [phone]  

## 2018-11-01 NOTE — Telephone Encounter (Signed)
Scheduled appt per 02/17 los.  Patient declined calendar and avs, because she is my-chart active.

## 2018-11-22 ENCOUNTER — Other Ambulatory Visit (HOSPITAL_COMMUNITY): Payer: Self-pay | Admitting: *Deleted

## 2018-11-22 ENCOUNTER — Telehealth (HOSPITAL_COMMUNITY): Payer: Self-pay | Admitting: *Deleted

## 2018-11-22 NOTE — Telephone Encounter (Signed)
Preadmission screen  

## 2018-11-24 LAB — OB RESULTS CONSOLE GBS: GBS: NEGATIVE

## 2018-11-29 ENCOUNTER — Encounter (HOSPITAL_COMMUNITY): Payer: Self-pay | Admitting: *Deleted

## 2018-11-29 NOTE — Telephone Encounter (Signed)
Preadmission screen  

## 2018-11-30 ENCOUNTER — Other Ambulatory Visit: Payer: Self-pay | Admitting: Obstetrics & Gynecology

## 2018-12-01 ENCOUNTER — Other Ambulatory Visit (HOSPITAL_COMMUNITY): Payer: Self-pay | Admitting: *Deleted

## 2018-12-02 ENCOUNTER — Inpatient Hospital Stay (HOSPITAL_COMMUNITY): Payer: Managed Care, Other (non HMO) | Admitting: Anesthesiology

## 2018-12-02 ENCOUNTER — Encounter (HOSPITAL_COMMUNITY): Payer: Self-pay | Admitting: Family Medicine

## 2018-12-02 ENCOUNTER — Inpatient Hospital Stay (HOSPITAL_COMMUNITY): Payer: Managed Care, Other (non HMO)

## 2018-12-02 ENCOUNTER — Inpatient Hospital Stay (HOSPITAL_COMMUNITY)
Admission: AD | Admit: 2018-12-02 | Discharge: 2018-12-04 | DRG: 806 | Disposition: A | Discharge status: Home / Self Care | Payer: Managed Care, Other (non HMO) | Attending: Obstetrics & Gynecology | Admitting: Obstetrics & Gynecology

## 2018-12-02 ENCOUNTER — Other Ambulatory Visit: Payer: Self-pay

## 2018-12-02 DIAGNOSIS — O134 Gestational [pregnancy-induced] hypertension without significant proteinuria, complicating childbirth: Secondary | ICD-10-CM | POA: Diagnosis present

## 2018-12-02 DIAGNOSIS — O9912 Other diseases of the blood and blood-forming organs and certain disorders involving the immune mechanism complicating childbirth: Secondary | ICD-10-CM | POA: Diagnosis present

## 2018-12-02 DIAGNOSIS — Z3A39 39 weeks gestation of pregnancy: Secondary | ICD-10-CM | POA: Diagnosis not present

## 2018-12-02 DIAGNOSIS — Z87891 Personal history of nicotine dependence: Secondary | ICD-10-CM | POA: Diagnosis not present

## 2018-12-02 DIAGNOSIS — D682 Hereditary deficiency of other clotting factors: Secondary | ICD-10-CM | POA: Diagnosis present

## 2018-12-02 DIAGNOSIS — D6859 Other primary thrombophilia: Secondary | ICD-10-CM | POA: Diagnosis present

## 2018-12-02 DIAGNOSIS — O99113 Other diseases of the blood and blood-forming organs and certain disorders involving the immune mechanism complicating pregnancy, third trimester: Secondary | ICD-10-CM

## 2018-12-02 DIAGNOSIS — O99214 Obesity complicating childbirth: Secondary | ICD-10-CM | POA: Diagnosis present

## 2018-12-02 DIAGNOSIS — O139 Gestational [pregnancy-induced] hypertension without significant proteinuria, unspecified trimester: Secondary | ICD-10-CM | POA: Diagnosis not present

## 2018-12-02 LAB — CBC
HCT: 33.8 % — ABNORMAL LOW (ref 36.0–46.0)
HCT: 33.1 % — ABNORMAL LOW (ref 36.0–46.0)
Hemoglobin: 11.3 g/dL — ABNORMAL LOW (ref 12.0–15.0)
Hemoglobin: 10.8 g/dL — ABNORMAL LOW (ref 12.0–15.0)
MCH: 27.3 pg (ref 26.0–34.0)
MCH: 26.7 pg (ref 26.0–34.0)
MCHC: 33.4 g/dL (ref 30.0–36.0)
MCHC: 32.6 g/dL (ref 30.0–36.0)
MCV: 81.6 fL (ref 80.0–100.0)
MCV: 81.9 fL (ref 80.0–100.0)
PLATELETS: 260 10*3/uL (ref 150–400)
Platelets: 233 10*3/uL (ref 150–400)
RBC: 4.14 MIL/uL (ref 3.87–5.11)
RBC: 4.04 MIL/uL (ref 3.87–5.11)
RDW: 13 % (ref 11.5–15.5)
RDW: 13.1 % (ref 11.5–15.5)
WBC: 17 10*3/uL — ABNORMAL HIGH (ref 4.0–10.5)
WBC: 23.5 10*3/uL — ABNORMAL HIGH (ref 4.0–10.5)
nRBC: 0 % (ref 0.0–0.2)
nRBC: 0 % (ref 0.0–0.2)

## 2018-12-02 LAB — TYPE AND SCREEN
ABO/RH(D): O POS
Antibody Screen: NEGATIVE

## 2018-12-02 LAB — ABO/RH: ABO/RH(D): O POS

## 2018-12-02 LAB — RPR: RPR Ser Ql: NONREACTIVE

## 2018-12-02 LAB — PLATELET COUNT: PLATELETS: 248 10*3/uL (ref 150–400)

## 2018-12-02 MED ORDER — EPHEDRINE 5 MG/ML INJ: 10.0000 mg | INTRAVENOUS | Status: DC | PRN

## 2018-12-02 MED ORDER — MISOPROSTOL 25 MCG QUARTER TABLET
25.0000 ug | ORAL_TABLET | ORAL | Status: DC | PRN
  Administered 2018-12-02: 25 ug via VAGINAL
  Filled 2018-12-02: qty 1

## 2018-12-02 MED ORDER — COCONUT OIL OIL: 1.0000 "application " | TOPICAL_OIL | Status: DC | PRN

## 2018-12-02 MED ORDER — FLEET ENEMA 7-19 GM/118ML RE ENEM: 1.0000 | ENEMA | RECTAL | Status: DC | PRN

## 2018-12-02 MED ORDER — WITCH HAZEL-GLYCERIN EX PADS: 1.0000 "application " | MEDICATED_PAD | CUTANEOUS | Status: DC | PRN

## 2018-12-02 MED ORDER — PRENATAL MULTIVITAMIN CH
1.0000 | ORAL_TABLET | Freq: Every day | ORAL | Status: DC
  Filled 2018-12-02 (×2): qty 1

## 2018-12-02 MED ORDER — LIDOCAINE HCL (PF) 1 % IJ SOLN
30.0000 mL | INTRAMUSCULAR | Status: AC | PRN
  Administered 2018-12-02: 30 mL via SUBCUTANEOUS
  Filled 2018-12-02: qty 30

## 2018-12-02 MED ORDER — OXYTOCIN 40 UNITS IN NORMAL SALINE INFUSION - SIMPLE MED: 2.5000 [IU]/h | INTRAVENOUS | Status: DC

## 2018-12-02 MED ORDER — PHENYLEPHRINE 40 MCG/ML (10ML) SYRINGE FOR IV PUSH (FOR BLOOD PRESSURE SUPPORT)
80.0000 ug | PREFILLED_SYRINGE | INTRAVENOUS | Status: DC | PRN
  Filled 2018-12-02: qty 10

## 2018-12-02 MED ORDER — DIBUCAINE 1 % RE OINT: 1.0000 "application " | TOPICAL_OINTMENT | RECTAL | Status: DC | PRN

## 2018-12-02 MED ORDER — TETANUS-DIPHTH-ACELL PERTUSSIS 5-2.5-18.5 LF-MCG/0.5 IM SUSP: 0.5000 mL | Freq: Once | INTRAMUSCULAR | Status: DC

## 2018-12-02 MED ORDER — TERBUTALINE SULFATE 1 MG/ML IJ SOLN: 0.2500 mg | Freq: Once | INTRAMUSCULAR | Status: DC | PRN

## 2018-12-02 MED ORDER — LACTATED RINGERS IV SOLN: 500.0000 mL | INTRAVENOUS | Status: DC | PRN

## 2018-12-02 MED ORDER — ENOXAPARIN SODIUM 80 MG/0.8ML ~~LOC~~ SOLN
70.0000 mg | SUBCUTANEOUS | Status: DC
  Administered 2018-12-03 – 2018-12-04 (×2): 70 mg via SUBCUTANEOUS
  Filled 2018-12-02 (×2): qty 0.8

## 2018-12-02 MED ORDER — SIMETHICONE 80 MG PO CHEW: 80.0000 mg | CHEWABLE_TABLET | ORAL | Status: DC | PRN

## 2018-12-02 MED ORDER — EPHEDRINE 5 MG/ML INJ
10.0000 mg | INTRAVENOUS | Status: DC | PRN
  Administered 2018-12-02: 10 mg via INTRAVENOUS
  Filled 2018-12-02: qty 10

## 2018-12-02 MED ORDER — ZOLPIDEM TARTRATE 5 MG PO TABS: 5.0000 mg | ORAL_TABLET | Freq: Every evening | ORAL | Status: DC | PRN

## 2018-12-02 MED ORDER — BENZOCAINE-MENTHOL 20-0.5 % EX AERO
1.0000 "application " | INHALATION_SPRAY | CUTANEOUS | Status: DC | PRN
  Administered 2018-12-02: 1 via TOPICAL
  Filled 2018-12-02: qty 56

## 2018-12-02 MED ORDER — ONDANSETRON HCL 4 MG PO TABS: 4.0000 mg | ORAL_TABLET | ORAL | Status: DC | PRN

## 2018-12-02 MED ORDER — OXYTOCIN 40 UNITS IN NORMAL SALINE INFUSION - SIMPLE MED
1.0000 m[IU]/min | INTRAVENOUS | Status: DC
  Administered 2018-12-02: 1 m[IU]/min via INTRAVENOUS
  Filled 2018-12-02: qty 1000

## 2018-12-02 MED ORDER — SODIUM CHLORIDE (PF) 0.9 % IJ SOLN
INTRAMUSCULAR | Status: DC | PRN
  Administered 2018-12-02: 12 mL/h via EPIDURAL

## 2018-12-02 MED ORDER — ONDANSETRON HCL 4 MG/2ML IJ SOLN: 4.0000 mg | INTRAMUSCULAR | Status: DC | PRN

## 2018-12-02 MED ORDER — DIPHENHYDRAMINE HCL 25 MG PO CAPS: 25.0000 mg | ORAL_CAPSULE | Freq: Four times a day (QID) | ORAL | Status: DC | PRN

## 2018-12-02 MED ORDER — FENTANYL CITRATE (PF) 100 MCG/2ML IJ SOLN
50.0000 ug | INTRAMUSCULAR | Status: DC | PRN
  Administered 2018-12-02 (×2): 100 ug via INTRAVENOUS
  Filled 2018-12-02 (×2): qty 2

## 2018-12-02 MED ORDER — ONDANSETRON HCL 4 MG/2ML IJ SOLN: 4.0000 mg | Freq: Four times a day (QID) | INTRAMUSCULAR | Status: DC | PRN

## 2018-12-02 MED ORDER — FENTANYL-BUPIVACAINE-NACL 0.5-0.125-0.9 MG/250ML-% EP SOLN
12.0000 mL/h | EPIDURAL | Status: DC | PRN
  Filled 2018-12-02: qty 250

## 2018-12-02 MED ORDER — LACTATED RINGERS IV SOLN
INTRAVENOUS | Status: DC
  Administered 2018-12-02: 01:00:00 via INTRAVENOUS

## 2018-12-02 MED ORDER — DIPHENHYDRAMINE HCL 50 MG/ML IJ SOLN: 12.5000 mg | INTRAMUSCULAR | Status: DC | PRN

## 2018-12-02 MED ORDER — LIDOCAINE-EPINEPHRINE (PF) 2 %-1:200000 IJ SOLN
INTRAMUSCULAR | Status: DC | PRN
  Administered 2018-12-02: 5 mL via EPIDURAL
  Administered 2018-12-02: 2 mL via EPIDURAL

## 2018-12-02 MED ORDER — LACTATED RINGERS IV SOLN: 500.0000 mL | Freq: Once | INTRAVENOUS | Status: DC

## 2018-12-02 MED ORDER — PHENYLEPHRINE 40 MCG/ML (10ML) SYRINGE FOR IV PUSH (FOR BLOOD PRESSURE SUPPORT)
80.0000 ug | PREFILLED_SYRINGE | INTRAVENOUS | Status: AC | PRN
  Administered 2018-12-02 (×3): 80 ug via INTRAVENOUS

## 2018-12-02 MED ORDER — SENNOSIDES-DOCUSATE SODIUM 8.6-50 MG PO TABS
2.0000 | ORAL_TABLET | ORAL | Status: DC
  Administered 2018-12-02 – 2018-12-03 (×2): 2 via ORAL
  Filled 2018-12-02 (×2): qty 2

## 2018-12-02 MED ORDER — OXYTOCIN BOLUS FROM INFUSION
500.0000 mL | Freq: Once | INTRAVENOUS | Status: AC
  Administered 2018-12-02: 500 mL via INTRAVENOUS

## 2018-12-02 MED ORDER — SOD CITRATE-CITRIC ACID 500-334 MG/5ML PO SOLN: 30.0000 mL | ORAL | Status: DC | PRN

## 2018-12-02 MED ORDER — IBUPROFEN 600 MG PO TABS
600.0000 mg | ORAL_TABLET | Freq: Four times a day (QID) | ORAL | Status: DC
  Administered 2018-12-02 – 2018-12-04 (×8): 600 mg via ORAL
  Filled 2018-12-02 (×8): qty 1

## 2018-12-02 NOTE — Anesthesia Pain Management Evaluation Note (Signed)
  [facility] Pain Management Visit Note  Patient: Leslie Branch, 33 y.o., female  "Hello I am a member of the anesthesia team at [facility]. We have an anesthesia team available at all times to provide care throughout the hospital, including epidural management and anesthesia for C-section. I don't know your plan for the delivery whether it a natural birth, water birth, IV sedation, nitrous supplementation, doula or epidural, but we want to meet your pain goals."   1.Was your pain managed to your expectations on prior hospitalizations?   No prior hospitalizations  2.What is your expectation for pain management during this hospitalization?     The patient has not made a decision regarding pain control.  3.How can we help you reach that goal? Education provided about epidural anesthesia.  Record the patient's initial score and the patient's pain goal.   Pain: 8  Pain Goal: 8 [facility] wants you to be able to say your pain was always managed very well.  Leslie Branch 12/02/2018

## 2018-12-02 NOTE — Progress Notes (Signed)
Labor Progress Note Leslie Branch is a 33 y.o. female, G5P0040 at 51 weeks, IOL for prothrombin complex deficiency.   Subjective:  Pt is breathing through contractions, rating them 9 out of 10, waiting for platelets to result to get an epidural.   Objective:  BP 137/63   Pulse 99   Temp 99.9 F (37.7 C) (Oral)   Resp 20   Ht 5\' 9"  (1.753 m)   Wt (!) 143.4 kg   LMP 03/04/2018 (Exact Date)   BMI 46.69 kg/m     FHT: Baseline 140. NICHD category 1, moderate variability, accels present, decels absent. UC:   Not tracing in R lateral positions, but q 2-3 minutes per pt response SVE:   Deferred (3.5/70/-1 at [phone]) MVUs n/a  Assessment/Plan:  Fetal Wellbeing: cat 1 Labor: progressing Preeclampsia:  normotensive, denies sx GBS:   neg Pain Control:  epidural now Anticipated MOD:  SVD  Jonetta Speak, CNM 12/02/2018, 8:13 AM

## 2018-12-02 NOTE — Progress Notes (Signed)
Labor Progress Note Leslie Hamptonis a 33 y.o.female, G5P0040at 39weeks, IOL for prothrombin complex deficiency.    Subjective:  Pt is very comfortable with epidural, rates contractions 2 out of 10 on pain scale. We discussed plan of care to restart pitocin and pt agreed given how contractions are spacing out.  Objective:  BP 136/70   Pulse 98   Temp 99.4 F (37.4 C) (Oral)   Resp 18   Ht 5\' 9"  (1.753 m)   Wt (!) 143.4 kg   LMP 03/04/2018 (Exact Date)   SpO2 98%   BMI 46.69 kg/m     FHT: Category 1, 145. Moderate variability with + accels, no decels UC:   regular, every 6 minutes SVE:   Deferred, last exam by Pinn at 1100, 5/90/-1 Pitocin: starting now MVUs: n/a  Assessment/Plan:  Fetal Wellbeing: cat 1 Labor: progressing Preeclampsia: normotensive, denies sx GBS: neg Pain Control: epidural now Anticipated MOD:SVD   Jonetta Speak, CNM 12/02/2018, 3:21 PM

## 2018-12-02 NOTE — Anesthesia Procedure Notes (Signed)
Epidural Patient location during procedure: OB Start time: 12/02/2018 8:28 AM End time: 12/02/2018 8:41 AM  Staffing Anesthesiologist: Lucretia Kern, MD Performed: anesthesiologist   Preanesthetic Checklist Completed: patient identified, pre-op evaluation, timeout performed, IV checked, risks and benefits discussed and monitors and equipment checked  Epidural Patient position: sitting Prep: DuraPrep Patient monitoring: heart rate, continuous pulse ox and blood pressure Approach: midline Location: L2-L3 Injection technique: LOR air  Needle:  Needle type: Tuohy  Needle gauge: 17 G Needle length: 9 cm Needle insertion depth: 6 cm Catheter type: closed end flexible Catheter size: 19 Gauge Catheter at skin depth: 11 cm Test dose: negative and 2% lidocaine with Epi 1:200 K  Assessment Events: blood not aspirated, injection not painful, no injection resistance, negative IV test and no paresthesia  Additional Notes Reason for block:procedure for pain

## 2018-12-02 NOTE — Progress Notes (Signed)
Labor Progress Note Leslie Hamptonis a 33 y.o.female, G5P0040at 39weeks,IOLfor prothrombin complex deficiency.   Subjective:  Pt is smiling and cooperative, not feeling painful contractions.  Objective:  BP 136/73   Pulse (!) 110   Temp 98.5 F (36.9 C) (Oral)   Resp 18   Ht 5\' 9"  (1.753 m)   Wt (!) 143.4 kg   LMP 03/04/2018 (Exact Date)   SpO2 98%   BMI 46.69 kg/m    Prolonged decel with SVE FHT: Category 2, moderate variability, accels present, variable and prolonged decels present, Dr. Mora Appl updated UC:   regular, every 2-3 minutes SVE:   Dilation: Lip/rim Effacement (%): 90 Station: Plus 1 Exam by:: Okey Regal CNM Pitocin at 2 mu/min MVUs 185  Assessment/Plan:  Fetal Wellbeing:  tracing currently back to cat 1 Labor: progressing very well Preeclampsia:normotensive, denies sx GBS:neg   Pain Control:  epidural Anticipated MOD:  SVD     Jonetta Speak, CNM 12/02/2018, 6:45 PM

## 2018-12-02 NOTE — Progress Notes (Signed)
Subjective: Breathing with contraction.  Using IV pain medication  Objective: BP 129/74   Pulse 95   Temp 99.9 F (37.7 C) (Oral)   Resp 18   Ht 5\' 9"  (1.753 m)   Wt (!) 143.4 kg   LMP 03/04/2018 (Exact Date)   BMI 46.69 kg/m  No intake/output data recorded. No intake/output data recorded.  FHT: Category 1 FHT 145 accels, variability present, occ variable UC:   regular, every 2-3 minutes SVE:   Dilation: 3.5 Effacement (%): 70 Station: -1 Exam by:: Harriett Sine P., CNM  SROM 0530 mec stained fluid  Assessment:  G5P0040 at 39 wks with prothrombin mutation induction of labor Mec stained fluid Cat 1 strip Maternal obesity  Rubella nonimmune  Plan: Possible IUPC placement if needed Pitocin augmentation in needed Anticipate SVD  Kenney Houseman CNM, MSN 12/02/2018, 7:08 AM

## 2018-12-02 NOTE — Progress Notes (Signed)
Leslie Branch is a 33 y.o. G5P0040 at [redacted]w[redacted]d by LMP admitted for induction of labor due to Prothrombin Complex deficiency and Morbid obesity.   Subjective: Patient feels more pressure  Objective: BP (!) 147/80   Pulse 99   Temp 99.2 F (37.3 C) (Oral)   Resp 18   Ht 5\' 9"  (1.753 m)   Wt (!) 143.4 kg   LMP 03/04/2018 (Exact Date)   SpO2 98%   BMI 46.69 kg/m  I/O last 3 completed shifts: In: -  Out: 1100 [Urine:1100] No intake/output data recorded.  FHT:  FHR: 150 bpm, variability: moderate,  accelerations:  Present,  decelerations:  Present variable decels with contractions nadir 120s lasting one minute with return to baseline UC:   regular, every 2-3 minutes SVE:   Dilation: 10 Effacement (%): 90 Station: Plus 3 Exam by:: [employer] CNM  Labs: Lab Results  Component Value Date   WBC 17.0 (H) 12/02/2018   HGB 11.3 (L) 12/02/2018   HCT 33.8 (L) 12/02/2018   MCV 81.6 12/02/2018   PLT 248 12/02/2018    Assessment / Plan: Induction of labor due to Prothrombin deficiency,  progressing well on pitocin Baby with several episodes of late decels during labor and now deep variables with  Contractions.  Discussed with mother possibility of operative vaginal delivery we discussed risks vs benefits.  Labor: Progressing normally Preeclampsia:  no signs or symptoms of toxicity Fetal Wellbeing:  Category II Pain Control:  Epidural I/D:  n/a Anticipated MOD:  VAVD  Leslie Branch Leslie Branch 12/02/2018, 8:12 PM

## 2018-12-02 NOTE — Progress Notes (Signed)
Attempted to reposition patient to left tilt. Fetal heart rate immediately responded with deceleration. Patient returned to previous position. Fetal heart rate returned to baseline.

## 2018-12-02 NOTE — Progress Notes (Signed)
LATE ENTRY   Labor Progress Note Deb Hamptonis a 33 y.o.female, G5P0040at 39weeks,IOLfor prothrombin complex deficiency.   I saw prolonged decels on fetal tracing ~0930 and went to room to assess. Pt had received epidural 0835. At 0930 she was still mildly hypotensive with a particularly low diastolic pressure. The RN and I were in the room together, anesthesia was called, resuscitative measures were performed. Internals placed. Phenylephrine and ephedrine and fluid boluses were administered. Dr. Mora Appl was made aware of continued  decels.  Dr. Mora Appl came to the hospital and assessed the patient in person and ordered pitocin when baby was stable. Baby remained very positional, only tolerating upright maternal position, but decels resolved.   Jonetta Speak, CNM 12/02/2018, 1030am

## 2018-12-02 NOTE — H&P (Signed)
Leslie Branch is a 33 y.o. female, G5P0040 at 2 weeks, presenting for induction of labor for prothrombin complex deficiency.  Prenatal hx remarkable for  PCD on Lovenox, rubella nonimmune and maternal obesity.  Per hematology patient should be on Lovenox 60 mg until 6 weeks postpartum and wear compression stockings. Placed on heparin at 36 weeks 10,000 units BID and restart lovenox after delivery.  Patient Active Problem List   Diagnosis Date Noted  . Obesity, morbid (HCC) 07/02/2018  . Abnormal biochemical finding on antenatal screening of mother 07/02/2018  . Maternal care for suspected chromosomal abnormality in fetus 07/02/2018  . Plantar fasciitis of left foot 12/02/2017  . Prothrombin gene mutation (HCC) 08/25/2016  . Iron deficiency anemia due to chronic blood loss 08/25/2016  . Excessive bleeding 08/21/2016    History of present pregnancy: Patient entered care at 8.6 weeks.   EDC of 12/09/2018 was established by LMP/US.   Anatomy scan: 20 weeks, with normal findings and an  Anterior placenta.   Additional [place] evaluations: 31 weeks AFI wnl, EFW 62% vtx  38 weeks 68 % with normal AFI Significant prenatal events: Had amino with normal female per [facility] Last evaluation:  Last week.  OB History    Gravida  5   Para      Term      Preterm      AB  4   Living  0     SAB  1   TAB  3   Ectopic      Multiple      Live Births             Past Medical History:  Diagnosis Date  . Anemia   . Blood clot in vein    right hand - thumb  . IBS (irritable bowel syndrome)   . 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 Termination of pregnancy (fetus)    x 3 in clinics   Past Surgical History:  Procedure Laterality Date  . COLONOSCOPY    . DILATION AND EVACUATION N/A 07/22/2017   Procedure: DILATATION AND EVACUATION WITH GENETICS;  Surgeon: Ranae Pila, MD;  Location: [facility];  Service: Gynecology;  Laterality: N/A;  . HAND SURGERY Left    pinky -  reconstructive  . KNEE ARTHROSCOPY Left   . TONSILLECTOMY AND ADENOIDECTOMY     as a child  . tubes in ears     as a child  . UPPER GI ENDOSCOPY     Family History: family history includes Diabetes in her father and sister; Hyperlipidemia in her mother; Hypertension in her mother. Social History:  reports that she quit smoking about 8 years ago. Her smoking use included cigarettes. She has a 10.00 pack-year smoking history. She has never used smokeless tobacco. She reports that she does not drink alcohol or use drugs.   Prenatal Transfer Tool  Maternal Diabetes: No Genetic Screening: Normal Maternal Ultrasounds/Referrals: Normal Fetal Ultrasounds or other Referrals:  None Maternal Substance Abuse:  No Significant Maternal Medications:  Meds include: Other: Lovenox  Significant Maternal Lab Results: None  TDAP UTD Flu UTD  RXY:VOPFYT of Systems  All other systems reviewed and are negative.   Allergies  Allergen Reactions  . Chloraseptic Sore Throat [Acetaminophen] Anaphylaxis    Closed throat when used spray       Last menstrual period 03/04/2018, unknown if currently breastfeeding.  Chest clear Heart RRR without murmur Abd gravid, NT, FH appropriate Pelvic:  2/70/-1 Ext: Neg  FHR: Category FHT 145 accels, no decels UCs: none  Prenatal labs: ABO, Rh:  O+ Antibody:   Neg Rubella:   Nonimmune RPR:   NR HBsAg:   Neg HIV:   NR GBS:  Neg GC: Neg Chlamydia: Neg Genetic screenings: wnl Glucola: 132 Other:   Hgb 13.2 at NOB, 11.0 at 28 weeks       Assessment/Plan: G5P0040 at 39 wks with prothrombin mutation induction of labor Cat 1 strip Maternal obesity  Rubella nonimmune  Plan: Admit to [employer] Suite Routine [facility] orders Cytotec induction Pain med/epidural prn   Henderson Newcomer ProtheroCNM, MSN 12/02/2018, 12:06 AM

## 2018-12-02 NOTE — Lactation Note (Signed)
This note was copied from a baby's chart. Lactation Consultation Note  Patient Name: Leslie Branch [id number] Date: 12/02/2018 Reason for consult: Initial assessment;1st time breastfeeding;Primapara;Other (Comment)(DAT (+))  2 hours old FT female who is being exclusively BF by his mother, she's a P1. Mom voiced she was not offered to put baby to the breast on Labor and delivery and that all they did was just putting baby STS, but no breastfeeding help was offered. Dad present and supportive, he voiced the same concerns. Mom knew about the importance of BF within the first hour after birth, she took BF classes at the [facility] office in the [facility].  Baby is DAT (+) and both parents are aware of his DAT status. LC showed mom how to hand express, and she was able to get a small droplet of colostrum. Mom has a DEBP at home.  Offered assistance with latch, and mom agreed to wake baby up to feed. LC took baby STS to mom's breast but he wasn't able to suck, he'd open his mouth and formed a sealed but no sucking reflex elicit unless there was some sort of stimulation and kep unlatching on and off. Baby won't suck on a finger either, noticed that the bone in his gums was visible. Mom kept him STS when LC exited the room; baby was very sleepy. Reviewed normal newborn behavior and feeding cues. LC also explained to mom the importance of start pumping early on when we have Cums (+) and offered to set up a DEBP, but mom politely declined, she said "she didn't bring her own" LC explained we can set her up with a hospital grade pump, but mom not ready to start pumping at this point.  Feeding plan:  1. Encouraged mom to feed baby STS 8-12 times/24 hours or sooner if feeding cues are present 2. Hand expression and spoon feeding was also encouraged 3. Mom will let her RN know whenever she's ready to start double pumping  BF brochure and feeding diary were reviewed. Parents reported all questions and concerns were answered,  they're both aware of LC services and will call PRN.  Maternal Data Formula Feeding for Exclusion: No Has patient been taught Hand Expression?: Yes Does the patient have breastfeeding experience prior to this delivery?: No  Feeding Feeding Type: Breast Fed  LATCH Score Latch: Repeated attempts needed to sustain latch, nipple held in mouth throughout feeding, stimulation needed to elicit sucking reflex.  Audible Swallowing: None  Type of Nipple: Everted at rest and after stimulation(short shafted, semi-flat)  Comfort (Breast/Nipple): Soft / non-tender  Hold (Positioning): Assistance needed to correctly position infant at breast and maintain latch.  LATCH Score: 6  Interventions Interventions: Breast feeding basics reviewed;Assisted with latch;Skin to skin;Breast massage;Hand express;Breast compression;Adjust position;Support pillows  Lactation Tools Discussed/Used WIC Program: Yes   Consult Status Consult Status: Follow-up Date: 12/03/18 Follow-up type: In-patient    Leslie Branch 12/02/2018, 11:21 PM

## 2018-12-02 NOTE — Anesthesia Preprocedure Evaluation (Addendum)
Anesthesia Evaluation  Patient identified by MRN, date of birth, ID band Patient awake    Reviewed: Allergy & Precautions, H&P , NPO status , Patient's Chart, lab work & pertinent test results  History of Anesthesia Complications Negative for: history of anesthetic complications  Airway Mallampati: II  TM Distance: >3 FB Neck ROM: full    Dental no notable dental hx.    Pulmonary neg pulmonary ROS, former smoker,    Pulmonary exam normal        Cardiovascular negative cardio ROS Normal cardiovascular exam Rhythm:regular Rate:Normal     Neuro/Psych negative neurological ROS  negative psych ROS   GI/Hepatic negative GI ROS, Neg liver ROS,   Endo/Other  Morbid obesity  Renal/GU negative Renal ROS  negative genitourinary   Musculoskeletal   Abdominal   Peds  Hematology  (+) Blood dyscrasia (heterozygous prothrombin gene mutation- home Lovenox/heparin), ,   Anesthesia Other Findings 33 yo F in labor, requesting epidural; PMH significant for heterozygous prothrombin gene mutation and BMI 46 Followed by hematology- has been on Lovenox which was recently transitioned to SQ heparin; had w/u for bleeding disorder which was negative  Reproductive/Obstetrics (+) Pregnancy                            Anesthesia Physical Anesthesia Plan  ASA: III  Anesthesia Plan: Epidural   Post-op Pain Management:    Induction:   PONV Risk Score and Plan:   Airway Management Planned:   Additional Equipment:   Intra-op Plan:   Post-operative Plan:   Informed Consent: I have reviewed the patients History and Physical, chart, labs and discussed the procedure including the risks, benefits and alternatives for the proposed anesthesia with the patient or authorized representative who has indicated his/her understanding and acceptance.       Plan Discussed with:   Anesthesia Plan Comments:         Anesthesia Quick Evaluation

## 2018-12-03 DIAGNOSIS — O139 Gestational [pregnancy-induced] hypertension without significant proteinuria, unspecified trimester: Secondary | ICD-10-CM | POA: Diagnosis not present

## 2018-12-03 LAB — CBC WITH DIFFERENTIAL/PLATELET
Abs Immature Granulocytes: 0.15 10*3/uL — ABNORMAL HIGH (ref 0.00–0.07)
BASOS PCT: 0 %
Basophils Absolute: 0.1 10*3/uL (ref 0.0–0.1)
Eosinophils Absolute: 0.2 10*3/uL (ref 0.0–0.5)
Eosinophils Relative: 1 %
HCT: 30.7 % — ABNORMAL LOW (ref 36.0–46.0)
Hemoglobin: 10.4 g/dL — ABNORMAL LOW (ref 12.0–15.0)
Immature Granulocytes: 1 %
Lymphocytes Relative: 14 %
Lymphs Abs: 2.7 10*3/uL (ref 0.7–4.0)
MCH: 27.8 pg (ref 26.0–34.0)
MCHC: 33.9 g/dL (ref 30.0–36.0)
MCV: 82.1 fL (ref 80.0–100.0)
Monocytes Absolute: 1.5 10*3/uL — ABNORMAL HIGH (ref 0.1–1.0)
Monocytes Relative: 8 %
NRBC: 0 % (ref 0.0–0.2)
Neutro Abs: 14.7 10*3/uL — ABNORMAL HIGH (ref 1.7–7.7)
Neutrophils Relative %: 76 %
PLATELETS: 248 10*3/uL (ref 150–400)
RBC: 3.74 MIL/uL — ABNORMAL LOW (ref 3.87–5.11)
RDW: 13.3 % (ref 11.5–15.5)
WBC: 19.4 10*3/uL — AB (ref 4.0–10.5)

## 2018-12-03 LAB — CBC
HCT: 32.9 % — ABNORMAL LOW (ref 36.0–46.0)
Hemoglobin: 11 g/dL — ABNORMAL LOW (ref 12.0–15.0)
MCH: 27.6 pg (ref 26.0–34.0)
MCHC: 33.4 g/dL (ref 30.0–36.0)
MCV: 82.5 fL (ref 80.0–100.0)
Platelets: 254 10*3/uL (ref 150–400)
RBC: 3.99 MIL/uL (ref 3.87–5.11)
RDW: 13.2 % (ref 11.5–15.5)
WBC: 23.6 10*3/uL — ABNORMAL HIGH (ref 4.0–10.5)
nRBC: 0 % (ref 0.0–0.2)

## 2018-12-03 LAB — COMPREHENSIVE METABOLIC PANEL
ALT: 26 U/L (ref 0–44)
ANION GAP: 11 (ref 5–15)
AST: 56 U/L — ABNORMAL HIGH (ref 15–41)
Albumin: 2.4 g/dL — ABNORMAL LOW (ref 3.5–5.0)
Alkaline Phosphatase: 79 U/L (ref 38–126)
BUN: 10 mg/dL (ref 6–20)
CO2: 22 mmol/L (ref 22–32)
Calcium: 8.5 mg/dL — ABNORMAL LOW (ref 8.9–10.3)
Chloride: 105 mmol/L (ref 98–111)
Creatinine, Ser: 0.93 mg/dL (ref 0.44–1.00)
GFR calc Af Amer: >60 mL/min (ref >60)
GFR calc non Af Amer: >60 mL/min (ref >60)
Glucose, Bld: 164 mg/dL — ABNORMAL HIGH (ref 70–99)
Potassium: 3.8 mmol/L (ref 3.5–5.1)
SODIUM: 138 mmol/L (ref 135–145)
Total Bilirubin: 0.5 mg/dL (ref 0.3–1.2)
Total Protein: 5.6 g/dL — ABNORMAL LOW (ref 6.5–8.1)

## 2018-12-03 LAB — LACTATE DEHYDROGENASE: LDH: 206 U/L — ABNORMAL HIGH (ref 98–192)

## 2018-12-03 LAB — URIC ACID: Uric Acid, Serum: 5.9 mg/dL (ref 2.5–7.1)

## 2018-12-03 MED ORDER — MEASLES, MUMPS & RUBELLA VAC IJ SOLR
0.5000 mL | Freq: Once | INTRAMUSCULAR | Status: AC
  Administered 2018-12-04: 0.5 mL via SUBCUTANEOUS
  Filled 2018-12-03: qty 0.5

## 2018-12-03 NOTE — Lactation Note (Signed)
This note was copied from a baby's chart. Lactation Consultation Note  Patient Name: Boy Katonia Juday [id number] Date: 12/03/2018   Visited with P1 Mom of term baby at 65 hrs old.  Mom holding baby STS on her chest following bath.  Mom states baby is latching well and denies any discomfort with latching.  Baby has 4 recorded feedings with latches of 6-8.    Mom reports little breast change with pregnancy, though nipples enlarged and darkened.  Assessed baby's gums due to previous report.  Baby's lower left gum ridge noted to be higher than the right side.  Baby noted to have a highly arched palate.  Will want to observe baby latched to breast.  Mom aware of importance of her calling out to desk for lactation at next feeding.    Baby has a + DAT and will have a serum bilirubin drawn at 12 noon.  Talked about adding double pumping to plan after latch assessment if needed.    Judee Clara 12/03/2018, 11:32 AM

## 2018-12-03 NOTE — Lactation Note (Signed)
This note was copied from a baby's chart. Lactation Consultation Note  Patient Name: Leslie Branch [id number] Date: 12/03/2018 Reason for consult: Follow-up assessment;Difficult latch;Term;Primapara;1st time breastfeeding  P1 mother whose infant is now 2 hours old.  Mother was attempting to latch baby when I arrived.  Offered to assist and she accepted.  Baby was not showing any feeding cues but mother was interested in trying to feed.  Upon my gloved finger I noted baby to have a high bubble palate.  He was not interested in sucking at all.  With cheek and jaw support he finally did a few short sucking bursts but showed no further interest.  His upper and lower gums are noted to have extra gum tissue.  His tongue extension is limited.  Since he was not interested in latching at all I suggested STS.  Mother had been holding him STS for an extended time period and preferred him to be swaddle at the present time.  I swaddled him and laid him in his bassinet.  Due to +DAT I suggested mother begin pumping with the DEBP and she was agreeable to this plan.  Pump parts, assembly, disassembly and cleaning reviewed.  #24 flange size is appropriate at this time.  Mother will continue to feed 8-12 times/24 hours or sooner if baby shows cues.  She will call for latch assistance as needed.  A few colostrum drops noted at the end of the pumping session.  Mother aware to finger feed any EBM she obtains to baby.  Colostrum container provided with instructions and mother will do hand expression before/after pumping to help with milk supply.  She will call for latch assistance as needed.  RN updated.   Maternal Data Formula Feeding for Exclusion: No Has patient been taught Hand Expression?: Yes Does the patient have breastfeeding experience prior to this delivery?: No  Feeding Feeding Type: Breast Fed  LATCH Score Latch: Too sleepy or reluctant, no latch achieved, no sucking elicited.  Audible Swallowing:  None  Type of Nipple: Everted at rest and after stimulation(short shafted bilaterally)  Comfort (Breast/Nipple): Soft / non-tender  Hold (Positioning): Assistance needed to correctly position infant at breast and maintain latch.  LATCH Score: 5  Interventions Interventions: Breast feeding basics reviewed;Assisted with latch;Skin to skin;Breast massage;Hand express;Breast compression;Hand pump;Position options;Support pillows;Adjust position;DEBP  Lactation Tools Discussed/Used Pump Review: Setup, frequency, and cleaning;Milk Storage Initiated by:: [name] Date initiated:: 12/03/18   Consult Status Consult Status: Follow-up Date: 12/04/18 Follow-up type: In-patient    Alaney Witter R Anaiz Qazi 12/03/2018, 1:50 PM

## 2018-12-03 NOTE — Anesthesia Postprocedure Evaluation (Signed)
Anesthesia Post Note  Patient: Leslie Branch  Procedure(s) Performed: AN AD HOC LABOR EPIDURAL     Patient location during evaluation: Mother Baby Anesthesia Type: Epidural Level of consciousness: awake and alert and oriented Pain management: satisfactory to patient Vital Signs Assessment: post-procedure vital signs reviewed and stable Respiratory status: spontaneous breathing and nonlabored ventilation Cardiovascular status: stable Postop Assessment: no headache, no backache, no signs of nausea or vomiting, adequate PO intake, patient able to bend at knees and able to ambulate (patient up walking) Anesthetic complications: no    Last Vitals:  Vitals:   12/03/18 0730 12/03/18 1209  BP: 135/71 (!) 155/93  Pulse: (!) 104 (!) 113  Resp: 20 18  Temp: 36.6 C 36.6 C  SpO2:      Last Pain:  Vitals:   12/03/18 1209  TempSrc: Oral  PainSc: 0-No pain   Pain Goal:                   Vane Yapp

## 2018-12-03 NOTE — Progress Notes (Addendum)
Subjective: Postpartum Day # 1 : S/P NSVD due to 33 y.o. female, G5P0040 at 44 weeks, presenting for induction of labor for prothrombin complex deficiency. Prenatal hx remarkable for  PCD on Lovenox, rubella nonimmune and maternal obesity.  Per hematology patient should be on Lovenox 60 mg until 6 weeks postpartum, but was changed to 70 mg daily byt pharmacy for weight and wear compression stockings. Placed on heparin at 36 weeks 10,000 units BID and restart lovenox after delivery. Patient up ad lib, denies syncope or dizziness. Reports consuming regular diet without issues and denies N/V. Patient reports 0 bowel movement + passing flatus.  Denies issues with urination and reports bleeding is "light."  Patient is breastfeeding and reports going well.  Desires condoms for postpartum contraception.  Pain is being appropriately managed with use of po meds. Baby female was circed by Dr Charlesetta Garibaldi today. Pt was talking on the phone denies denies HA, RUQ pain, or vision changes. I noticed elevated BP in 150-140s/100-70s   Currently 155/93, preE labs ordered and pending now.   2nd laceration Feeding:  breast Contraceptive plan:  condoms BB: Circ completed in pt today by Dr Charlesetta Garibaldi  Objective: Vital signs in last 24 hours: Patient Vitals for the past 24 hrs:  BP Temp Temp src Pulse Resp  12/03/18 1209 (!) 155/93 97.9 F (36.6 C) Oral (!) 113 18  12/03/18 0730 135/71 97.9 F (36.6 C) Oral (!) 104 20  12/03/18 0327 (!) 116/57 97.7 F (36.5 C) Oral 91 16  12/02/18 2230 (!) 142/84 98.2 F (36.8 C) Oral (!) 105 17  12/02/18 2213 - 98.7 F (37.1 C) Oral - -  12/02/18 2146 - - - - 16  12/02/18 2131 126/73 - - (!) 105 16  12/02/18 2115 123/60 99.2 F (37.3 C) Oral 99 16  12/02/18 2100 114/70 - - 98 17  12/02/18 2050 118/63 - - (!) 107 17  12/02/18 2047 95/75 - - (!) 112 17  12/02/18 2044 (!) 141/77 - - (!) 109 17  12/02/18 2000 (!) 147/80 - - 99 18  12/02/18 1931 (!) 146/81 99.2 F (37.3 C) Oral 98 17   12/02/18 1901 (!) 145/80 - - (!) 101 18  12/02/18 1801 136/73 - - (!) 110 18  12/02/18 1731 133/74 - - (!) 104 18  12/02/18 1701 (!) 143/63 - - 97 18  12/02/18 1630 134/68 98.5 F (36.9 C) Oral 99 18  12/02/18 1600 138/74 - - 96 18  12/02/18 1531 124/65 - - 92 18  12/02/18 1529 (!) 131/106 - - (!) 103 -  12/02/18 1501 136/70 - - 98 18  12/02/18 1431 131/64 - - (!) 101 18     Physical Exam:  General: alert, cooperative, appears stated age and no distress Mood/Affect: Happy Lungs: clear to auscultation, no wheezes, rales or rhonchi, symmetric air entry.  Heart: normal rate, regular rhythm, normal S1, S2, no murmurs, rubs, clicks or gallops. Breast: breasts appear normal, no suspicious masses, no skin or nipple changes or axillary nodes. Abdomen:  + bowel sounds, soft, non-tender GU: perineum 2nd, healing well. No signs of external hematomas.  Uterine Fundus: firm Lochia: appropriate Skin: Warm, Dry. DVT Evaluation: No evidence of DVT seen on physical exam. Negative Homan's sign. No cords or calf tenderness. No significant calf/ankle edema.  CBC Latest Ref Rng & Units 12/03/2018 12/02/2018 12/02/2018  WBC 4.0 - 10.5 K/uL 23.6(H) 23.5(H) -  Hemoglobin 12.0 - 15.0 g/dL 11.0(L) 10.8(L) -  Hematocrit 36.0 -  46.0 % 32.9(L) 33.1(L) -  Platelets 150 - 400 K/uL 254 233 248    Results for orders placed or performed during the hospital encounter of 12/02/18 (from the past 24 hour(s))  CBC     Status: Abnormal   Collection Time: 12/02/18  8:59 PM  Result Value Ref Range   WBC 23.5 (H) 4.0 - 10.5 K/uL   RBC 4.04 3.87 - 5.11 MIL/uL   Hemoglobin 10.8 (L) 12.0 - 15.0 g/dL   HCT 33.1 (L) 36.0 - 46.0 %   MCV 81.9 80.0 - 100.0 fL   MCH 26.7 26.0 - 34.0 pg   MCHC 32.6 30.0 - 36.0 g/dL   RDW 13.1 11.5 - 15.5 %   Platelets 233 150 - 400 K/uL   nRBC 0.0 0.0 - 0.2 %  CBC     Status: Abnormal   Collection Time: 12/03/18  5:11 AM  Result Value Ref Range   WBC 23.6 (H) 4.0 - 10.5 K/uL   RBC  3.99 3.87 - 5.11 MIL/uL   Hemoglobin 11.0 (L) 12.0 - 15.0 g/dL   HCT 32.9 (L) 36.0 - 46.0 %   MCV 82.5 80.0 - 100.0 fL   MCH 27.6 26.0 - 34.0 pg   MCHC 33.4 30.0 - 36.0 g/dL   RDW 13.2 11.5 - 15.5 %   Platelets 254 150 - 400 K/uL   nRBC 0.0 0.0 - 0.2 %     CBG (last 3)  No results for input(s): GLUCAP in the last 72 hours.   I/O last 3 completed shifts: In: -  Out: 1482 [Urine:1200; Blood:282]   Assessment Postpartum Day # 1 : S/P NSVD due to induction of labor for prothrombin complex deficiency. Prenatal hx remarkable for  PCD on Lovenox, rubella nonimmune and maternal obesity.  Per hematology patient should be on Lovenox 60 mg until 6 weeks postpartum, but was changed to 70 mg daily byt pharmacy for weight and wear compression stockings. Placed on heparin at 36 weeks 10,000 units BID and restart lovenox this morning. Pt stable. -1 involution. breastfeeding. Hemodynamically stable with HGB increase from 10.8-11. Pt stable no s/sx, dx with GHTN due to elevated BP yesterday and today, BP now 155/93, labs ordered to rule out preeclampsia.   Plan: Continue other mgmt as ordered VTE prophylactics: Early ambulated as tolerates.  Prothrombin complex def: continue daily lovenox 70mg.  Pain control: Motrin/Tylenol PRN Rubella Non-Immune: Pt to receive MMR. GHTN: Newly dx, based off elevated BP post admission, monitor, BP, pending baseline preeclampsia rule out labs.  Education given regarding options for contraception, including barrier methods, injectable contraception, IUD placement, oral contraceptives.  Plan for discharge tomorrow, Breastfeeding, Lactation consult and Contraception condoms   Dr. Dillard to be updated on patient status  Jade Montana NP-C, CNM 12/03/2018, 2:08 PM  

## 2018-12-04 LAB — PROTEIN / CREATININE RATIO, URINE
Creatinine, Urine: 129.33 mg/dL
Creatinine, Urine: 185.32 mg/dL
Protein Creatinine Ratio: 1.11 mg/mg{Cre} — ABNORMAL HIGH (ref 0.00–0.15)
Protein Creatinine Ratio: 0.05 mg/mg{Cre} (ref 0.00–0.15)
Total Protein, Urine: 144 mg/dL
Total Protein, Urine: 10 mg/dL

## 2018-12-04 LAB — CBC WITH DIFFERENTIAL/PLATELET
Abs Immature Granulocytes: 0.15 K/uL — ABNORMAL HIGH (ref 0.00–0.07)
Basophils Absolute: 0.1 K/uL (ref 0.0–0.1)
Basophils Relative: 0 %
Eosinophils Absolute: 0.3 K/uL (ref 0.0–0.5)
Eosinophils Relative: 2 %
HCT: 30.6 % — ABNORMAL LOW (ref 36.0–46.0)
Hemoglobin: 10 g/dL — ABNORMAL LOW (ref 12.0–15.0)
Immature Granulocytes: 1 %
Lymphocytes Relative: 17 %
Lymphs Abs: 2.9 K/uL (ref 0.7–4.0)
MCH: 27.2 pg (ref 26.0–34.0)
MCHC: 32.7 g/dL (ref 30.0–36.0)
MCV: 83.4 fL (ref 80.0–100.0)
Monocytes Absolute: 1 K/uL (ref 0.1–1.0)
Monocytes Relative: 6 %
Neutro Abs: 12.4 K/uL — ABNORMAL HIGH (ref 1.7–7.7)
Neutrophils Relative %: 74 %
Platelets: 241 K/uL (ref 150–400)
RBC: 3.67 MIL/uL — ABNORMAL LOW (ref 3.87–5.11)
RDW: 13.4 % (ref 11.5–15.5)
WBC: 16.8 K/uL — ABNORMAL HIGH (ref 4.0–10.5)
nRBC: 0 % (ref 0.0–0.2)

## 2018-12-04 LAB — COMPREHENSIVE METABOLIC PANEL
ALT: 29 U/L (ref 0–44)
AST: 49 U/L — ABNORMAL HIGH (ref 15–41)
Albumin: 2.4 g/dL — ABNORMAL LOW (ref 3.5–5.0)
Alkaline Phosphatase: 83 U/L (ref 38–126)
Anion gap: 10 (ref 5–15)
BUN: 10 mg/dL (ref 6–20)
CO2: 21 mmol/L — AB (ref 22–32)
Calcium: 8.5 mg/dL — ABNORMAL LOW (ref 8.9–10.3)
Chloride: 107 mmol/L (ref 98–111)
Creatinine, Ser: 0.68 mg/dL (ref 0.44–1.00)
GFR calc Af Amer: >60 mL/min (ref >60)
Glucose, Bld: 165 mg/dL — ABNORMAL HIGH (ref 70–99)
POTASSIUM: 3.8 mmol/L (ref 3.5–5.1)
Sodium: 138 mmol/L (ref 135–145)
Total Bilirubin: 0.4 mg/dL (ref 0.3–1.2)
Total Protein: 5.7 g/dL — ABNORMAL LOW (ref 6.5–8.1)

## 2018-12-04 MED ORDER — ENOXAPARIN SODIUM 80 MG/0.8ML ~~LOC~~ SOLN: 70.0000 mg | SUBCUTANEOUS | 0 refills | Status: DC

## 2018-12-04 MED ORDER — LABETALOL HCL 200 MG PO TABS: 200.0000 mg | ORAL_TABLET | Freq: Two times a day (BID) | ORAL | Status: DC

## 2018-12-04 MED ORDER — IBUPROFEN 600 MG PO TABS: 600.0000 mg | ORAL_TABLET | Freq: Four times a day (QID) | ORAL | 0 refills | Status: DC | PRN

## 2018-12-04 NOTE — Discharge Instructions (Signed)
Postpartum Care After Vaginal Delivery ° °The period of time right after you deliver your newborn is called the postpartum period. °What kind of medical care will I receive? °· You may continue to receive fluids and medicines through an IV tube inserted into one of your veins. °· If an incision was made near your vagina (episiotomy) or if you had some vaginal tearing during delivery, cold compresses may be placed on your episiotomy or your tear. This helps to reduce pain and swelling. °· You may be given a squirt bottle to use when you go to the bathroom. You may use this until you are comfortable wiping as usual. To use the squirt bottle, follow these steps: °? Before you urinate, fill the squirt bottle with warm water. Do not use hot water. °? After you urinate, while you are sitting on the toilet, use the squirt bottle to rinse the area around your urethra and vaginal opening. This rinses away any urine and blood. °? You may do this instead of wiping. As you start healing, you may use the squirt bottle before wiping yourself. Make sure to wipe gently. °? Fill the squirt bottle with clean water every time you use the bathroom. °· You will be given sanitary pads to wear. °How can I expect to feel? °· You may not feel the need to urinate for several hours after delivery. °· You will have some soreness and pain in your abdomen and vagina. °· If you are breastfeeding, you may have uterine contractions every time you breastfeed for up to several weeks postpartum. Uterine contractions help your uterus return to its normal size. °· It is normal to have vaginal bleeding (lochia) after delivery. The amount and appearance of lochia is often similar to a menstrual period in the first week after delivery. It will gradually decrease over the next few weeks to a dry, yellow-brown discharge. For most women, lochia stops completely by 6-8 weeks after delivery. Vaginal bleeding can vary from woman to woman. °· Within the first few  days after delivery, you may have breast engorgement. This is when your breasts feel heavy, full, and uncomfortable. Your breasts may also throb and feel hard, tightly stretched, warm, and tender. After this occurs, you may have milk leaking from your breasts. Your health care provider can help you relieve discomfort due to breast engorgement. Breast engorgement should go away within a few days. °· You may feel more sad or worried than normal due to hormonal changes after delivery. These feelings should not last more than a few days. If these feelings do not go away after several days, speak with your health care provider. °How should I care for myself? °· Tell your health care provider if you have pain or discomfort. °· Drink enough water to keep your urine clear or pale yellow. °· Wash your hands thoroughly with soap and water for at least 20 seconds after changing your sanitary pads, after using the toilet, and before holding or feeding your baby. °· If you are not breastfeeding, avoid touching your breasts a lot. Doing this can make your breasts produce more milk. °· If you become weak or lightheaded, or you feel like you might faint, ask for help before: °? Getting out of bed. °? Showering. °· Change your sanitary pads frequently. Watch for any changes in your flow, such as a sudden increase in volume, a change in color, the passing of large blood clots. If you pass a blood clot from your vagina,   save it to show to your health care provider. Do not flush blood clots down the toilet without having your health care provider look at them.  Make sure that all your vaccinations are up to date. This can help protect you and your baby from getting certain diseases. You may need to have immunizations done before you leave the hospital.  If desired, talk with your health care provider about methods of family planning or birth control (contraception). How can I start bonding with my baby? Spending as much time as  possible with your baby is very important. During this time, you and your baby can get to know each other and develop a bond. Having your baby stay with you in your room (rooming in) can give you time to get to know your baby. Rooming in can also help you become comfortable caring for your baby. Breastfeeding can also help you bond with your baby. How can I plan for returning home with my baby?  Make sure that you have a car seat installed in your vehicle. ? Your car seat should be checked by a certified car seat installer to make sure that it is installed safely. ? Make sure that your baby fits into the car seat safely.  Ask your health care provider any questions you have about caring for yourself or your baby. Make sure that you are able to contact your health care provider with any questions after leaving the hospital. This information is not intended to replace advice given to you by your health care provider. Make sure you discuss any questions you have with your health care provider. Document Released: 06/29/2007 Document Revised: 02/04/2016 Document Reviewed: 08/06/2015 Elsevier Interactive Patient Education  2018 [employer].   Postpartum Depression and Baby Blues The postpartum period begins right after the birth of a baby. During this time, there is often a great amount of joy and excitement. It is also a time of many changes in the life of the parents. Regardless of how many times a mother gives birth, each child brings new challenges and dynamics to the family. It is not unusual to have feelings of excitement along with confusing shifts in moods, emotions, and thoughts. All mothers are at risk of developing postpartum depression or the "baby blues." These mood changes can occur right after giving birth, or they may occur many months after giving birth. The baby blues or postpartum depression can be mild or severe. Additionally, postpartum depression can go away rather quickly, or it can  be a long-term condition. What are the causes? Raised hormone levels and the rapid drop in those levels are thought to be a main cause of postpartum depression and the baby blues. A number of hormones change during and after pregnancy. Estrogen and progesterone usually decrease right after the delivery of your baby. The levels of thyroid hormone and various cortisol steroids also rapidly drop. Other factors that play a role in these mood changes include major life events and genetics. What increases the risk? If you have any of the following risks for the baby blues or postpartum depression, know what symptoms to watch out for during the postpartum period. Risk factors that may increase the likelihood of getting the baby blues or postpartum depression include:  Having a personal or family history of depression.  Having depression while being pregnant.  Having premenstrual mood issues or mood issues related to oral contraceptives.  Having a lot of life stress.  Having marital conflict.  Lacking  a social support network. °· Having a baby with special needs. °· Having health problems, such as diabetes. ° °What are the signs or symptoms? °Symptoms of baby blues include: °· Brief changes in mood, such as going from extreme happiness to sadness. °· Decreased concentration. °· Difficulty sleeping. °· Crying spells, tearfulness. °· Irritability. °· Anxiety. ° °Symptoms of postpartum depression typically begin within the first month after giving birth. These symptoms include: °· Difficulty sleeping or excessive sleepiness. °· Marked weight loss. °· Agitation. °· Feelings of worthlessness. °· Lack of interest in activity or food. ° °Postpartum psychosis is a very serious condition and can be dangerous. Fortunately, it is rare. Displaying any of the following symptoms is cause for immediate medical attention. Symptoms of postpartum psychosis include: °· Hallucinations and delusions. °· Bizarre or disorganized  behavior. °· Confusion or disorientation. ° °How is this diagnosed? °A diagnosis is made by an evaluation of your symptoms. There are no medical or lab tests that lead to a diagnosis, but there are various questionnaires that a health care provider may use to identify those with the baby blues, postpartum depression, or psychosis. Often, a screening tool called the Edinburgh Postnatal Depression Scale is used to diagnose depression in the postpartum period. °How is this treated? °The baby blues usually goes away on its own in 1-2 weeks. Social support is often all that is needed. You will be encouraged to get adequate sleep and rest. Occasionally, you may be given medicines to help you sleep. °Postpartum depression requires treatment because it can last several months or longer if it is not treated. Treatment may include individual or group therapy, medicine, or both to address any social, physiological, and psychological factors that may play a role in the depression. Regular exercise, a healthy diet, rest, and social support may also be strongly recommended. °Postpartum psychosis is more serious and needs treatment right away. Hospitalization is often needed. °Follow these instructions at home: °· Get as much rest as you can. Nap when the baby sleeps. °· Exercise regularly. Some women find yoga and walking to be beneficial. °· Eat a balanced and nourishing diet. °· Do little things that you enjoy. Have a cup of tea, take a bubble bath, read your favorite magazine, or listen to your favorite music. °· Avoid alcohol. °· Ask for help with household chores, cooking, grocery shopping, or running errands as needed. Do not try to do everything. °· Talk to people close to you about how you are feeling. Get support from your partner, family members, friends, or other new moms. °· Try to stay positive in how you think. Think about the things you are grateful for. °· Do not spend a lot of time alone. °· Only take  over-the-counter or prescription medicine as directed by your health care provider. °· Keep all your postpartum appointments. °· Let your health care provider know if you have any concerns. °Contact a health care provider if: °You are having a reaction to or problems with your medicine. °Get help right away if: °· You have suicidal feelings. °· You think you may harm the baby or someone else. °This information is not intended to replace advice given to you by your health care provider. Make sure you discuss any questions you have with your health care provider. °Document Released: 06/05/2004 Document Revised: 02/07/2016 Document Reviewed: 06/13/2013 °Elsevier Interactive Patient Education © 2017 [employer] ° ° °Preeclampsia and Eclampsia ° °Preeclampsia is a serious condition that may develop during pregnancy. It   is also called toxemia of pregnancy. This condition causes high blood pressure along with other symptoms, such as swelling and headaches. These symptoms may develop as the condition gets worse. Preeclampsia may occur at 20 weeks of pregnancy or later. °Diagnosing and treating preeclampsia early is very important. If not treated early, it can cause serious problems for you and your baby. One problem it can lead to is eclampsia. Eclampsia is a condition that causes muscle jerking or shaking (convulsions or seizures) and other serious problems for the mother. During pregnancy, delivering your baby may be the best treatment for preeclampsia or eclampsia. For most women, preeclampsia and eclampsia symptoms go away after giving birth. °In rare cases, a woman may develop preeclampsia after giving birth (postpartum preeclampsia). This usually occurs within 48 hours after childbirth but may occur up to 6 weeks after giving birth. °What are the causes? °The cause of preeclampsia is not known. °What increases the risk? °The following risk factors make you more likely to develop preeclampsia: °· Being pregnant for  the first time. °· Having had preeclampsia during a past pregnancy. °· Having a family history of preeclampsia. °· Having high blood pressure. °· Being pregnant with more than one baby. °· Being 35 or older. °· Being African-American. °· Having kidney disease or diabetes. °· Having medical conditions such as lupus or blood diseases. °· Being very overweight (obese). °What are the signs or symptoms? °The earliest signs of preeclampsia are: °· High blood pressure. °· Increased protein in your urine. Your health care provider will check for this at every visit before you give birth (prenatal visit). °Other symptoms that may develop as the condition gets worse include: °· Severe headaches. °· Sudden weight gain. °· Swelling of the hands, face, legs, and feet. °· Nausea and vomiting. °· Vision problems, such as blurred or double vision. °· Numbness in the face, arms, legs, and feet. °· Urinating less than usual. °· Dizziness. °· Slurred speech. °· Abdominal pain, especially upper abdominal pain. °· Convulsions or seizures. °How is this diagnosed? °There are no screening tests for preeclampsia. Your health care provider will ask you about symptoms and check for signs of preeclampsia during your prenatal visits. You may also have tests that include: °· Urine tests. °· Blood tests. °· Checking your blood pressure. °· Monitoring your baby’s heart rate. °· Ultrasound. °How is this treated? °You and your health care provider will determine the treatment approach that is best for you. Treatment may include: °· Having more frequent prenatal exams to check for signs of preeclampsia, if you have an increased risk for preeclampsia. °· Medicine to lower your blood pressure. °· Staying in the hospital, if your condition is severe. There, treatment will focus on controlling your blood pressure and the amount of fluids in your body (fluid retention). °· Taking medicine (magnesium sulfate) to prevent seizures. This may be given as an  injection or through an IV. °· Taking a low-dose aspirin during your pregnancy. °· Delivering your baby early, if your condition gets worse. You may have your labor started with medicine (induced), or you may have a cesarean delivery. °Follow these instructions at home: °Eating and drinking ° °· Drink enough fluid to keep your urine pale yellow. °· Avoid caffeine. °Lifestyle °· Do not use any products that contain nicotine or tobacco, such as cigarettes and e-cigarettes. If you need help quitting, ask your health care provider. °· Do not use alcohol or drugs. °· Avoid stress as much as possible. Rest   and get plenty of sleep. General instructions  Take over-the-counter and prescription medicines only as told by your health care provider.  When lying down, lie on your left side. This keeps pressure off your major blood vessels.  When sitting or lying down, raise (elevate) your feet. Try putting some pillows underneath your lower legs.  Exercise regularly. Ask your health care provider what kinds of exercise are best for you.  Keep all follow-up and prenatal visits as told by your health care provider. This is important. How is this prevented? There is no known way of preventing preeclampsia or eclampsia from developing. However, to lower your risk of complications and detect problems early:  Get regular prenatal care. Your health care provider may be able to diagnose and treat the condition early.  Maintain a healthy weight. Ask your health care provider for help managing weight gain during pregnancy.  Work with your health care provider to manage any long-term (chronic) health conditions you have, such as diabetes or kidney problems.  You may have tests of your blood pressure and kidney function after giving birth.  Your health care provider may have you take low-dose aspirin during your next pregnancy. Contact a health care provider if:  You have symptoms that your health care provider told  you may require more treatment or monitoring, such as: ? Headaches. ? Nausea or vomiting. ? Abdominal pain. ? Dizziness. ? Light-headedness. Get help right away if:  You have severe: ? Abdominal pain. ? Headaches that do not get better. ? Dizziness. ? Vision problems. ? Confusion. ? Nausea or vomiting.  You have any of the following: ? A seizure. ? Sudden, rapid weight gain. ? Sudden swelling in your hands, ankles, or face. ? Trouble moving any part of your body. ? Numbness in any part of your body. ? Trouble speaking. ? Abnormal bleeding.  You faint. Summary  Preeclampsia is a serious condition that may develop during pregnancy. It is also called toxemia of pregnancy.  This condition causes high blood pressure along with other symptoms, such as swelling and headaches.  Diagnosing and treating preeclampsia early is very important. If not treated early, it can cause serious problems for you and your baby.  Get help right away if you have symptoms that your health care provider told you to watch for. This information is not intended to replace advice given to you by your health care provider. Make sure you discuss any questions you have with your health care provider. Document Released: 08/29/2000 Document Revised: 08/18/2017 Document Reviewed: 04/07/2016 Elsevier Interactive Patient Education  2019 [employer] Inc[employer]  Enoxaparin injection What is this medicine? ENOXAPARIN (ee nox a PA rin) is used after knee, hip, or abdominal surgeries to prevent blood clotting. It is also used to treat existing blood clots in the lungs or in the veins. This medicine may be used for other purposes; ask your health care provider or pharmacist if you have questions. COMMON BRAND NAME(S): Lovenox What should I tell my health care provider before I take this medicine? They need to know if you have any of these conditions: -bleeding disorders, hemorrhage, or hemophilia -infection of the heart or  heart valves -kidney or liver disease -previous stroke -prosthetic heart valve -recent surgery or delivery of a baby -ulcer in the stomach or intestine, diverticulitis, or other bowel disease -an unusual or allergic reaction to enoxaparin, heparin, pork or pork products, other medicines, foods, dyes, or preservatives -pregnant or trying to get pregnant -breast-feeding How should  I use this medicine? This medicine is for injection under the skin. It is usually given by a health-care professional. You or a family member may be trained on how to give the injections. If you are to give yourself injections, make sure you understand how to use the syringe, measure the dose if necessary, and give the injection. To avoid bruising, do not rub the site where this medicine has been injected. Do not take your medicine more often than directed. Do not stop taking except on the advice of your doctor or health care professional. Make sure you receive a puncture-resistant container to dispose of the needles and syringes once you have finished with them. Do not reuse these items. Return the container to your doctor or health care professional for proper disposal. Talk to your pediatrician regarding the use of this medicine in children. Special care may be needed. Overdosage: If you think you have taken too much of this medicine contact a poison control center or emergency room at once. NOTE: This medicine is only for you. Do not share this medicine with others. What if I miss a dose? If you miss a dose, take it as soon as you can. If it is almost time for your next dose, take only that dose. Do not take double or extra doses. What may interact with this medicine? -aspirin and aspirin-like medicines -certain medicines that treat or prevent blood clots -dipyridamole -NSAIDs, medicines for pain and inflammation, like ibuprofen or naproxen This list may not describe all possible interactions. Give your health care  provider a list of all the medicines, herbs, non-prescription drugs, or dietary supplements you use. Also tell them if you smoke, drink alcohol, or use illegal drugs. Some items may interact with your medicine. What should I watch for while using this medicine? Visit your healthcare professional for regular checks on your progress. You may need blood work done while you are taking this medicine. Your condition will be monitored carefully while you are receiving this medicine. It is important not to miss any appointments. If you are going to need surgery or other procedure, tell your healthcare professional that you are using this medicine. Using this medicine for a long time may weaken your bones and increase the risk of bone fractures. Avoid sports and activities that might cause injury while you are using this medicine. Severe falls or injuries can cause unseen bleeding. Be careful when using sharp tools or knives. Consider using an Neurosurgeon. Take special care brushing or flossing your teeth. Report any injuries, bruising, or red spots on the skin to your healthcare professional. Wear a medical ID bracelet or chain. Carry a card that describes your disease and details of your medicine and dosage times. What side effects may I notice from receiving this medicine? Side effects that you should report to your doctor or health care professional as soon as possible: -allergic reactions like skin rash, itching or hives, swelling of the face, lips, or tongue -bone pain -signs and symptoms of bleeding such as bloody or black, tarry stools; red or dark-brown urine; spitting up blood or brown material that looks like coffee grounds; red spots on the skin; unusual bruising or bleeding from the eye, gums, or nose -signs and symptoms of a blood clot such as chest pain; shortness of breath; pain, swelling, or warmth in the leg -signs and symptoms of a stroke such as changes in vision; confusion; trouble  speaking or understanding; severe headaches; sudden numbness or weakness  of the face, arm or leg; trouble walking; dizziness; loss of coordination Side effects that usually do not require medical attention (report to your doctor or health care professional if they continue or are bothersome): -hair loss -pain, redness, or irritation at site where injected This list may not describe all possible side effects. Call your doctor for medical advice about side effects. You may report side effects to FDA at 1-800-FDA-1088. Where should I keep my medicine? Keep out of the reach of children. Store at room temperature between 15 and 30 degrees C (59 and 86 degrees F). Do not freeze. If your injections have been specially prepared, you may need to store them in the refrigerator. Ask your pharmacist. Throw away any unused medicine after the expiration date. NOTE: This sheet is a summary. It may not cover all possible information. If you have questions about this medicine, talk to your doctor, pharmacist, or health care provider.  2019 Elsevier/[employer] Standard (2017-08-27 11:25:34)

## 2018-12-04 NOTE — Progress Notes (Signed)
Post Partum Day 2  Subjective: no complaints, up ad lib and voiding. Patient denies symptoms of preeclampsia. We repeated preeclampsia labs and CBC was unremarkable. AST was mildly elevated yesterday at 56 and is improved today at 49. PCR by void was elevated so repeat catheterized PCR ordered.   Objective: Vitals:   12/03/18 1209 12/03/18 2058 12/04/18 0514 12/04/18 1015  BP: (!) 155/93 135/77 (!) 141/78 121/73  Pulse: (!) 113 92 97 99  Resp: 18 16 18 20   Temp: 97.9 F (36.6 C) 98.6 F (37 C) 98 F (36.7 C) 98 F (36.7 C)  TempSrc: Oral Oral Oral Oral  SpO2:  98% 100% 100%  Weight:      Height:       Physical Exam:  General: alert and cooperative Lochia: appropriate Uterine Fundus: firm Incision: n/a DVT Evaluation: No evidence of DVT seen on physical exam. Negative Homan's sign. No cords or calf tenderness. No significant calf/ankle edema.  Recent Labs    12/03/18 1550 12/04/18 1015  HGB 10.4* 10.0*  HCT 30.7* 30.6*    Assessment/Plan: Breastfeeding  Patient possible candidate for discharge home today depending on pressures today and on PCR results which are pending.    LOS: 2 days   Janeece Riggers 12/04/2018, 12:28 PM

## 2018-12-04 NOTE — Discharge Summary (Signed)
OB Discharge Summary     Patient Name: Leslie Branch DOB: 12/13/1985 MRN: [id number]  Date of admission: 12/02/2018 Delivering MD: Janeece Riggers   Date of discharge: 12/04/2018  Admitting diagnosis: pregnancy Intrauterine pregnancy: [redacted]w[redacted]d     Secondary diagnosis:  Active Problems:   Thrombophilia affecting pregnancy in third trimester, antepartum [facility])   Gestational hypertension     Discharge diagnosis: Term Pregnancy Delivered and Gestational Hypertension                                                                                                Post partum procedures:n/a  Augmentation: Pitocin and Cytotec  Complications: None  Hospital course:  Induction of Labor With Vaginal Delivery   33 y.o. yo [id number] at [redacted]w[redacted]d was admitted to the hospital 12/02/2018 for induction of labor.  Indication for induction: prothrombin complex deficiency.  Patient had an uncomplicated labor course as follows: Membrane Rupture Time/Date: 5:30 AM ,12/02/2018   Intrapartum Procedures: Episiotomy: None [1]                                         Lacerations:  2nd degree [3]  Patient had delivery of a Viable infant.  Information for the patient's newborn:  Moria, Rantanen [[id number]]  Delivery Method: Vag-Spont   12/02/2018  Details of delivery can be found in separate delivery note.  Patient had a routine postpartum course. Patient is discharged home 12/04/18.  Physical exam  Vitals:   12/04/18 0514 12/04/18 1015 12/04/18 1255 12/04/18 1429  BP: (!) 141/78 121/73 128/79 128/71  Pulse: 97 99 96 (!) 101  Resp: 18 20  16   Temp: 98 F (36.7 C) 98 F (36.7 C)  97.9 F (36.6 C)  TempSrc: Oral Oral  Oral  SpO2: 100% 100%  99%  Weight:      Height:       General: alert, cooperative and no distress Lochia: appropriate Uterine Fundus: firm Incision: N/A DVT Evaluation: No evidence of DVT seen on physical exam. Negative Homan's sign. No cords or calf tenderness. No significant  calf/ankle edema. Gestational hypertension: Patient denies symptoms of preeclampsia and vitals and labs are both stable. Patient had elevated blood pressures postpartum but they appear to be related to the use of a too-small blood pressure cuff. With the appropriately sized cuff the patient is normotensive. Consulted Dr. Normand Sloop, patient is stable for discharge today without medication. Preeclampsia symptoms discussed and handout provided.  Prothrombin complex deficiency: Patient to continue taking Lovenox for a total of 6 weeks postpartum. Blood clot precautions reviewed with patient and Lovenox handout given.   Labs: Lab Results  Component Value Date   WBC 16.8 (H) 12/04/2018   HGB 10.0 (L) 12/04/2018   HCT 30.6 (L) 12/04/2018   MCV 83.4 12/04/2018   PLT 241 12/04/2018   CMP Latest Ref Rng & Units 12/04/2018  Glucose 70 - 99 mg/dL 045(T)  BUN 6 - 20 mg/dL 10  Creatinine 9.77 - 4.14 mg/dL 2.39  Sodium  135 - 145 mmol/L 138  Potassium 3.5 - 5.1 mmol/L 3.8  Chloride 98 - 111 mmol/L 107  CO2 22 - 32 mmol/L 21(L)  Calcium 8.9 - 10.3 mg/dL 7.6(H)  Total Protein 6.5 - 8.1 g/dL 2.0(N)  Total Bilirubin 0.3 - 1.2 mg/dL 0.4  Alkaline Phos 38 - 126 U/L 83  AST 15 - 41 U/L 49(H)  ALT 0 - 44 U/L 29    Discharge instruction: per After Visit Summary and "Baby and Me Booklet". Lovenox and preeclampsia handouts given.   After visit meds:  Allergies as of 12/04/2018      Reactions   Chloraseptic Sore Throat [acetaminophen] Anaphylaxis   Closed throat when used spray      Medication List    STOP taking these medications   heparin 47096 UNIT/ML injection   Ibuprofen-Famotidine 800-26.6 MG Tabs Commonly known as:  Duexis   iron polysaccharides 150 MG capsule Commonly known as:  NIFEREX   MAGNESIUM PO   POTASSIUM PO   Prenatal Gummies/DHA & FA 0.4-32.5 MG Chew     TAKE these medications   enoxaparin 80 MG/0.8ML injection Commonly known as:  LOVENOX Inject 0.7 mLs (70 mg total) into  the skin daily. Start taking on:  December 05, 2018 What changed:    medication strength  how much to take   ibuprofen 600 MG tablet Commonly known as:  ADVIL,MOTRIN Take 1 tablet (600 mg total) by mouth every 6 (six) hours as needed for moderate pain.       Diet: routine diet  Activity: Advance as tolerated. Pelvic rest for 6 weeks.   Outpatient follow GE:ZMOQ love RN to see patient for blood pressure checks next week, 6 weeks for postpartum visit Follow up Appt: Future Appointments  Date Time Provider Department Center  02/01/2019  8:45 AM [facility] ONLY [facility] None  02/01/2019  9:20 AM Johney Maine, MD [facility] None   Follow up Visit:No follow-ups on file.  Postpartum contraception: Condoms  Newborn Data: Live born female  Birth Weight: 7 lb 8.1 oz (3405 g) APGAR: 9, 9  Newborn Delivery   Birth date/time:  12/02/2018 20:31:00 Delivery type:  Vaginal, Spontaneous     Baby Feeding: Breast Disposition:rooming in   12/04/2018 Janeece Riggers, CNM

## 2018-12-04 NOTE — Lactation Note (Signed)
This note was copied from a baby's chart. Lactation Consultation Note  Patient Name: Leslie Branch [id number] Date: 12/04/2018 Reason for consult: Follow-up assessment;Term;Hyperbilirubinemia;Difficult latch  Visited with P1 Mom of term baby at 32 hrs old, baby at 4% weight loss.  Baby on double phototherapy, bilirubin stabilizing.  Baby may be discharged on home phototherapy.   Mom started giving formula by bottle this am, and is "fine with this".  DEBP set up at bedside, but Mom states she hasn't had time to pump as baby has been fussy.  She said she would pump at home.  Mom made aware of the importance of early and frequent pumping to support a full milk supply. Mom has a DEBP (from her insurance) at home.  Mom aware of pump rental available in [employer].   Engorgement prevention and treatment reviewed. Mom aware of OP lactation support available to her.  Encouraged to call prn.   Consult Status Consult Status: Complete Date: 12/04/18 Follow-up type: Call as needed    Judee Clara 12/04/2018, 11:02 AM

## 2019-01-29 IMAGING — US US MFM OB FOLLOW-UP
1 series · 13 of 28 positions shown · non-contrast
Comparison: none

[Series 1: us mfm ob follow-up · 74 acquisitions, 13 frames shown]
[im 3/74]
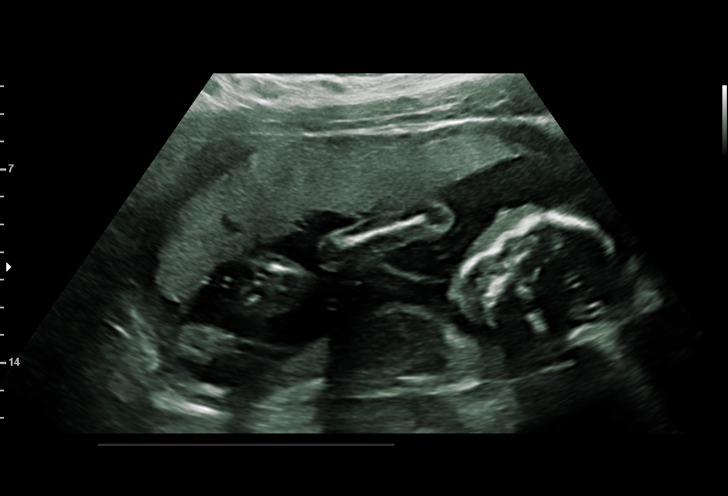
[im 9/74]
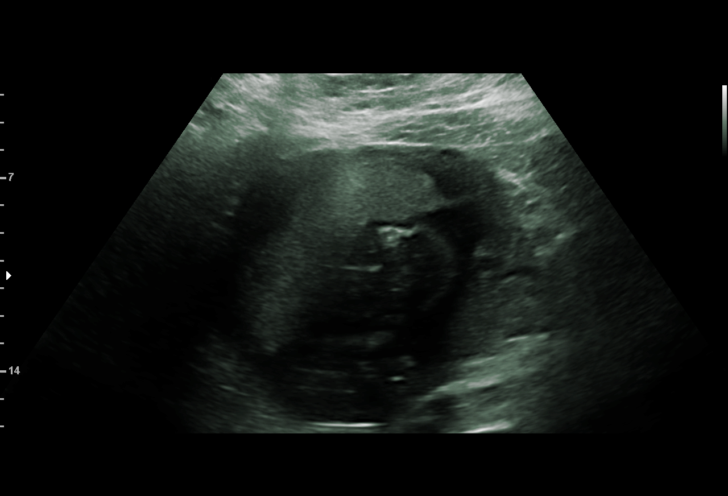
[im 14/74]
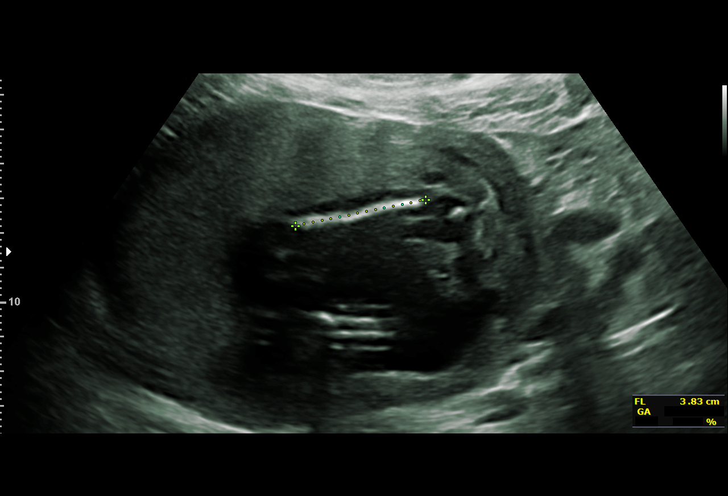
[im 19/74]
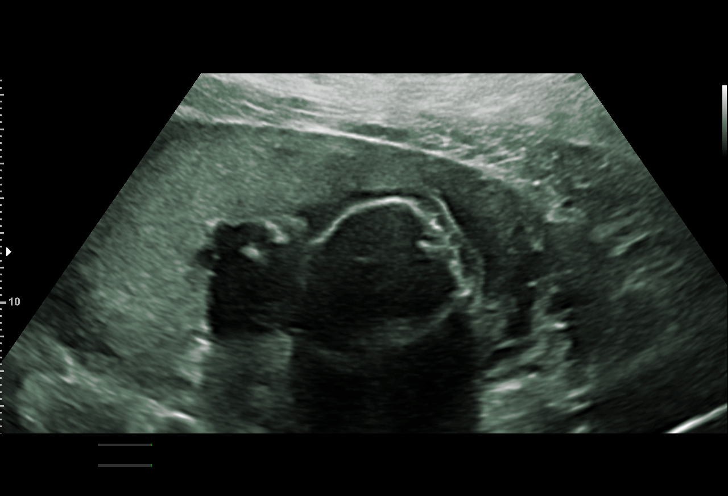
[im 25/74]
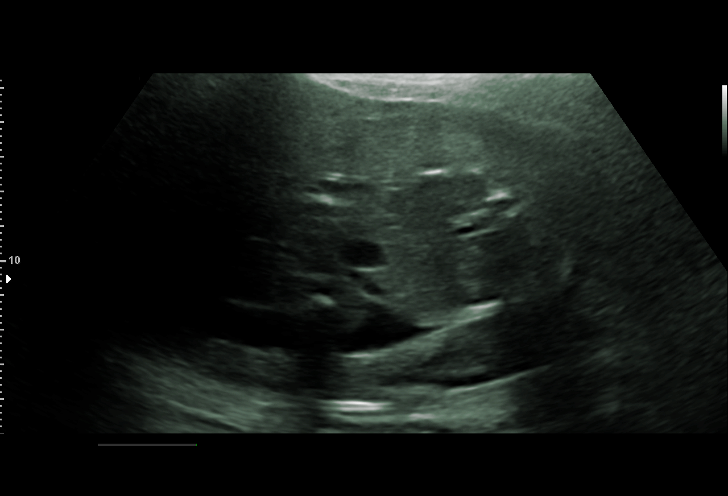
[im 30/74]
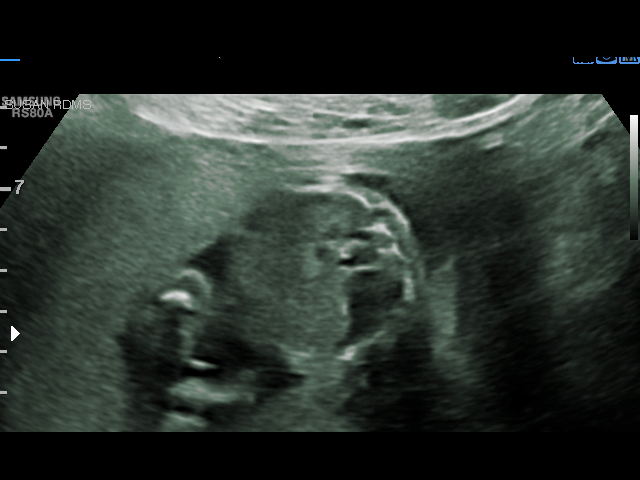
[im 38/74]
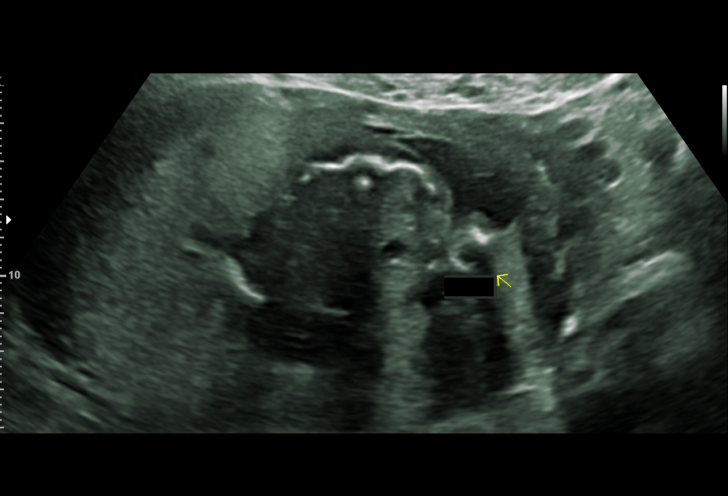
[im 44/74]
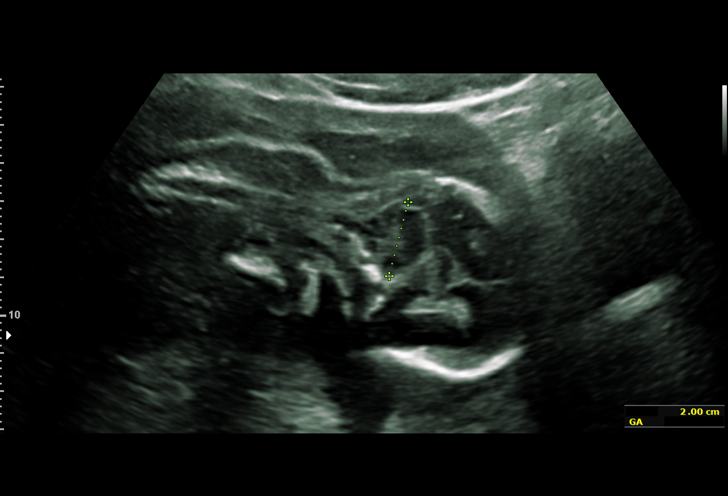
[im 49/74]
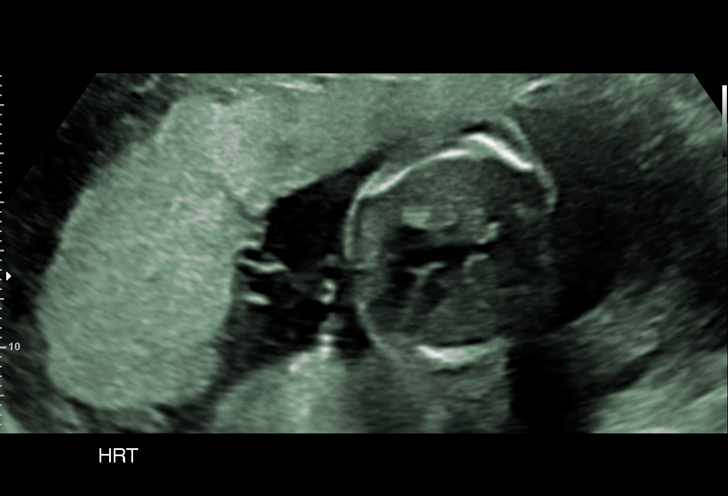
[im 55/74]
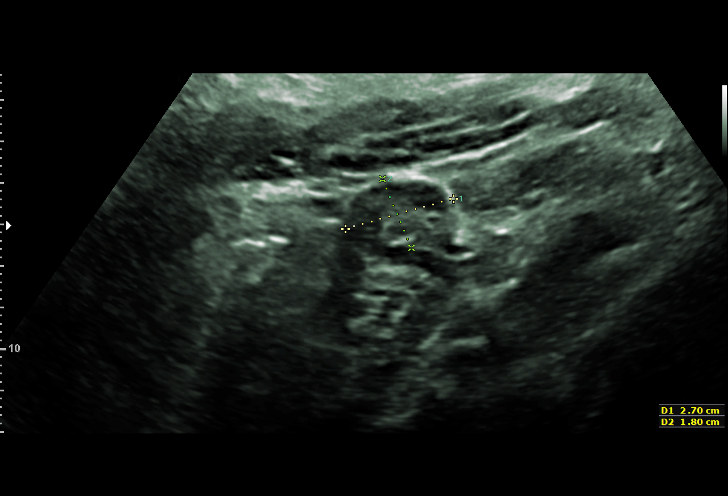
[im 60/74]
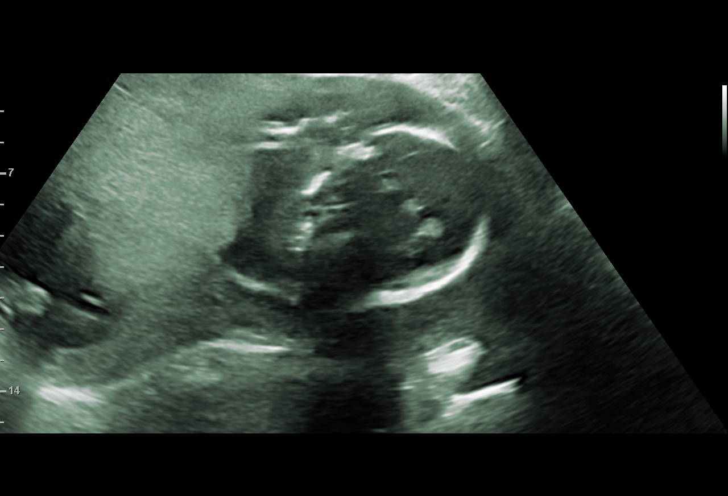
[im 65/74]
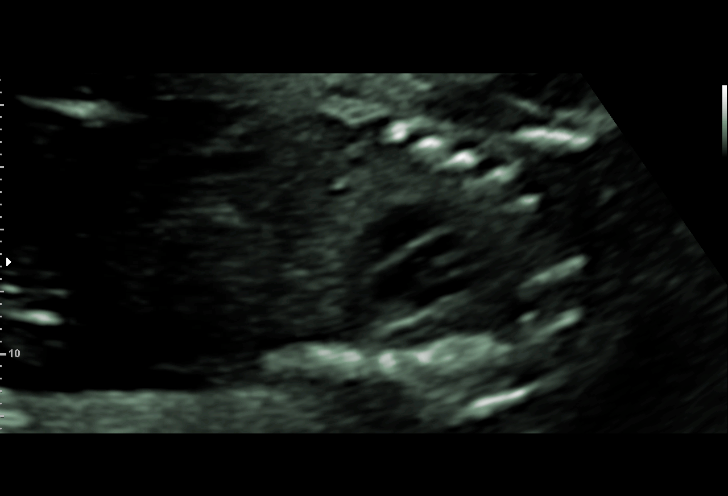
[im 71/74]
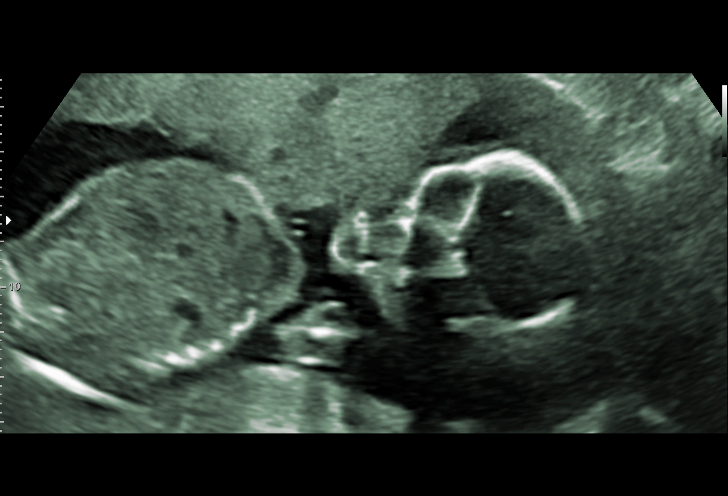

[13 of 28 positions shown; findings below may reference images not displayed]

#130

 ----------------------------------------------------------------------

 ----------------------------------------------------------------------
Indications

  Abnormal biochemical screen (NIPS low
  fetal fraction x2) ---- neg 1st trimester screen
  --- neg ONTD
  21 weeks gestation of pregnancy
  Medical complication of pregnancy
  (prothrombin gene mutation - hx bloodclot in
  thumb)
  Maternal morbid obesity (BMI 44)
 ----------------------------------------------------------------------
Vital Signs

 BMI:
Fetal Evaluation

 Num Of Fetuses:         1
 Fetal Heart Rate(bpm):  167
 Cardiac Activity:       Observed
 Presentation:           Cephalic
 Placenta:               Anterior

 Amniotic Fluid
 AFI FV:      Within normal limits

                             Largest Pocket(cm)

Biometry

 BPD:      49.8  mm     G. Age:  21w 0d         46  %    CI:        72.86   %    70 - 86
                                                         FL/HC:      19.4   %    15.9 -
 HC:      185.5  mm     G. Age:  20w 6d         30  %    HC/AC:      1.17        1.06 -
 AC:      158.4  mm     G. Age:  21w 0d         38  %    FL/BPD:     72.3   %
 FL:         36  mm     G. Age:  21w 3d         50  %    FL/AC:      22.7   %    20 - 24
 HUM:      32.9  mm     G. Age:  21w 0d         46  %
 CER:      19.8  mm     G. Age:  18w 6d        < 5  %

 Est. FW:     402  gm    0 lb 14 oz      44  %
OB History

 Gravidity:    5          SAB:   1
 TOP:          3        Living:  0
Gestational Age

 LMP:           21w 1d        Date:  03/04/18                 EDD:   12/09/18
 U/S Today:     21w 1d                                        EDD:   12/09/18
 Best:          21w 1d     Det. By:  LMP  (03/04/18)          EDD:   12/09/18
Anatomy

 Cranium:               Appears normal         Aortic Arch:            Not well visualized
 Cavum:                 Previously seen        Ductal Arch:            Previously seen
 Ventricles:            Appears normal         Diaphragm:              Appears normal
 Choroid Plexus:        Appears normal         Stomach:                Appears normal, left
                                                                       sided
 Cerebellum:            Appears normal         Abdomen:                Appears normal
 Posterior Fossa:       Appears normal         Abdominal Wall:         Appears nml (cord
                                                                       insert, abd wall)
 Nuchal Fold:           Previously seen        Cord Vessels:           Appears normal (3
                                                                       vessel cord)
 Face:                  Orbits nl; profile not Kidneys:                Appear normal
                        well visualized
 Lips:                  Appears normal         Bladder:                Appears normal
 Thoracic:              Appears normal         Spine:                  Appears normal
 Heart:                 Appears normal         Upper Extremities:      Previously seen
                        (4CH, axis, and situs
 RVOT:                  Appears normal         Lower Extremities:      Previously seen
 LVOT:                  Appears normal

 Other:  Technically difficult due to maternal habitus.
Cervix Uterus Adnexa

 Cervix
 Length:            2.9  cm.
 Normal appearance by transabdominal scan.

 Left Ovary
 Within normal limits.

 Right Ovary
 Not visualized.
Comments

 U/S images reviewed. Findings reviewed with patient.
 Appropriate fetal growth is noted.   No fetal abnormalities are
 seen but anatomy is limited by fetal position and maternal
 body habitus.
 Amniocentesis - 46 XY with normal microarray and normal
 AF-AFP.  Questions answered.
 15 minutes spent face to face with patient.
 Recommendations: 1) Serial U/S every 4 weeks for fetal
 growth 2) Weekly BPP beginning @ 36 weeks
Recommendations

  1) Serial U/S every 4 weeks for fetal growth 2) Weekly BPP
 beginning @ 36 weeks

              Billiot, Angi

## 2019-01-31 ENCOUNTER — Telehealth: Payer: Self-pay | Admitting: *Deleted

## 2019-01-31 NOTE — Telephone Encounter (Signed)
Patient has 21 month old baby and is trying to limit exposure to others. She wants to reschedule appt currently on 5/19. Per Dr. Candise Che, can have f/u in 1 month. Contacted patient. Patient verbalized understanding. Appts for 5/19 cancelled

## 2019-02-01 ENCOUNTER — Ambulatory Visit: Payer: Managed Care, Other (non HMO) | Admitting: Hematology

## 2019-02-01 ENCOUNTER — Inpatient Hospital Stay: Payer: Managed Care, Other (non HMO)

## 2019-02-01 NOTE — Telephone Encounter (Signed)
Schedule message sent to make appts for 3rd week of June.

## 2019-02-03 ENCOUNTER — Telehealth: Payer: Self-pay | Admitting: Hematology

## 2019-02-03 NOTE — Telephone Encounter (Signed)
Scheduled appt per sch msg. Called patient. No answer. Left msg.  °

## 2019-02-24 ENCOUNTER — Telehealth: Payer: Self-pay | Admitting: Hematology

## 2019-02-24 NOTE — Telephone Encounter (Signed)
Called pt per 6/11 sch message - unable to reach pt . Left message for patient to call back to reschedule.

## 2019-02-25 ENCOUNTER — Telehealth: Payer: Self-pay | Admitting: *Deleted

## 2019-02-25 NOTE — Telephone Encounter (Signed)
Notified by [facility] screener (COVID) that patient informed her she wanted to reschedule appts currently at 8:30AM/9AM for lab/Dr.Kale on Monday 6/15 to another day. Attempted to contact patient to confirm cancellation. Left voice mail asking for return call to cancel/reschedule.

## 2019-02-28 ENCOUNTER — Inpatient Hospital Stay: Payer: Managed Care, Other (non HMO)

## 2019-02-28 ENCOUNTER — Inpatient Hospital Stay: Payer: Managed Care, Other (non HMO) | Admitting: Hematology

## 2019-02-28 ENCOUNTER — Telehealth: Payer: Self-pay | Admitting: *Deleted

## 2019-02-28 NOTE — Telephone Encounter (Signed)
Attempted to contact patient this AM to confirm cancellation of today's appointment. LVM on named voice mail stating that as of 9AM, she had not come to the lab so per Friday's message, her appointments scheduled for today are cancelled.  Asked her to contact the office at her convenience to reschedule these appointments

## 2019-06-28 ENCOUNTER — Other Ambulatory Visit: Payer: Self-pay

## 2019-06-28 DIAGNOSIS — Z20822 Contact with and (suspected) exposure to covid-19: Secondary | ICD-10-CM

## 2019-06-30 LAB — NOVEL CORONAVIRUS, NAA: SARS-CoV-2, NAA: NOT DETECTED

## 2019-07-18 ENCOUNTER — Other Ambulatory Visit: Payer: Self-pay | Admitting: Obstetrics & Gynecology

## 2019-07-20 NOTE — Progress Notes (Signed)
[facility], [place] - [address] [facility] [place] [address] Phone: [phone] Fax: [phone]  [facility] [place] - [address] [facility] [address] [place] [address] Phone: [phone] Fax: [phone]      Your procedure is scheduled on November 11th.  Report to [facility] Main Entrance "A" at 9:00 A.M., and check in at the Admitting office.  Call this number if you have problems the morning of surgery:  [phone]  Call [phone] if you have any questions prior to your surgery date Monday-Friday 8am-4pm    Remember:  Do not eat or drink after midnight the night before your surgery     Take these medicines the morning of surgery with A SIP OF WATER  - NONE  7 days prior to surgery STOP taking any Aspirin (unless otherwise instructed by your surgeon), Aleve, Naproxen, Ibuprofen, Motrin, Advil, Goody's, BC's, all herbal medications, fish oil, and all vitamins.    The Morning of Surgery  Do not wear jewelry, make-up or nail polish.  Do not wear lotions, powders, or perfumes, or deodorant  Do not shave 48 hours prior to surgery.  Do not bring valuables to the hospital.  [facility] is not responsible for any belongings or valuables.  If you are a smoker, DO NOT Smoke 24 hours prior to surgery IF you wear a CPAP at night please bring your mask, tubing, and machine the morning of surgery   Remember that you must have someone to transport you home after your surgery, and remain with you for 24 hours if you are discharged the same day.   Contacts, glasses, hearing aids, dentures or bridgework may not be worn into surgery.    Leave your suitcase in the car.  After surgery it may be brought to your room.  For patients admitted to the hospital, discharge time will be determined by your treatment team.  Patients discharged the day of surgery will not be allowed to drive home.     Special instructions:   [facility]- Preparing For Surgery  Before surgery, you can play an important role. Because skin is not sterile, your skin needs to be as free of germs as possible. You can reduce the number of germs on your skin by washing with CHG (chlorahexidine gluconate) Soap before surgery.  CHG is an antiseptic cleaner which kills germs and bonds with the skin to continue killing germs even after washing.    Oral Hygiene is also important to reduce your risk of infection.  Remember - BRUSH YOUR TEETH THE MORNING OF SURGERY WITH YOUR REGULAR TOOTHPASTE  Please do not use if you have an allergy to CHG or antibacterial soaps. If your skin becomes reddened/irritated stop using the CHG.  Do not shave (including legs and underarms) for at least 48 hours prior to first CHG shower. It is OK to shave your face.  Please follow these instructions carefully.   1. Shower the NIGHT BEFORE SURGERY and the MORNING OF SURGERY with CHG Soap.   2. If you chose to wash your hair, wash your hair first as usual with your normal shampoo.  3. After you shampoo, rinse your hair and body thoroughly to remove the shampoo.  4. Use CHG as you would any other liquid soap. You can apply CHG directly to the skin and wash gently with a scrungie or a clean washcloth.   5. Apply the CHG Soap  to your body ONLY FROM THE NECK DOWN.  Do not use on open wounds or open sores. Avoid contact with your eyes, ears, mouth and genitals (private parts). Wash Face and genitals (private parts)  with your normal soap.   6. Wash thoroughly, paying special attention to the area where your surgery will be performed.  7. Thoroughly rinse your body with warm water from the neck down.  8. DO NOT shower/wash with your normal soap after using and rinsing off the CHG Soap.  9. Pat yourself dry with a CLEAN TOWEL.  10. Wear CLEAN PAJAMAS to bed the night before surgery, wear comfortable clothes the morning of  surgery  11. Place CLEAN SHEETS on your bed the night of your first shower and DO NOT SLEEP WITH PETS.    Day of Surgery:  Do not apply any deodorants/lotions. Please shower the morning of surgery with the CHG soap  Please wear clean clothes to the hospital/surgery center.   Remember to brush your teeth WITH YOUR REGULAR TOOTHPASTE.   Please read over the following fact sheets that you were given.

## 2019-07-21 ENCOUNTER — Encounter (HOSPITAL_COMMUNITY)
Admission: RE | Admit: 2019-07-21 | Discharge: 2019-07-21 | Disposition: A | Discharge status: Home / Self Care | Payer: Managed Care, Other (non HMO) | Source: Ambulatory Visit | Attending: Obstetrics & Gynecology | Admitting: Obstetrics & Gynecology

## 2019-07-21 ENCOUNTER — Encounter (HOSPITAL_COMMUNITY): Payer: Self-pay

## 2019-07-21 ENCOUNTER — Other Ambulatory Visit: Payer: Self-pay

## 2019-07-21 DIAGNOSIS — Z01818 Encounter for other preprocedural examination: Secondary | ICD-10-CM | POA: Insufficient documentation

## 2019-07-21 DIAGNOSIS — Z86718 Personal history of other venous thrombosis and embolism: Secondary | ICD-10-CM | POA: Diagnosis not present

## 2019-07-21 DIAGNOSIS — D649 Anemia, unspecified: Secondary | ICD-10-CM | POA: Insufficient documentation

## 2019-07-21 HISTORY — DX: Prediabetes: R73.03

## 2019-07-21 HISTORY — DX: Other specified postprocedural states: Z98.890

## 2019-07-21 HISTORY — DX: Fatty (change of) liver, not elsewhere classified: K76.0

## 2019-07-21 HISTORY — DX: Other specified postprocedural states: R11.2

## 2019-07-21 LAB — BASIC METABOLIC PANEL
Anion gap: 9 (ref 5–15)
BUN: 11 mg/dL (ref 6–20)
CO2: 20 mmol/L — ABNORMAL LOW (ref 22–32)
Calcium: 8.7 mg/dL — ABNORMAL LOW (ref 8.9–10.3)
Chloride: 110 mmol/L (ref 98–111)
Creatinine, Ser: 0.67 mg/dL (ref 0.44–1.00)
GFR calc Af Amer: >60 mL/min (ref >60)
GFR calc non Af Amer: >60 mL/min (ref >60)
Glucose, Bld: 113 mg/dL — ABNORMAL HIGH (ref 70–99)
Potassium: 4.1 mmol/L (ref 3.5–5.1)
Sodium: 139 mmol/L (ref 135–145)

## 2019-07-21 LAB — HEPATIC FUNCTION PANEL
ALT: 49 U/L — ABNORMAL HIGH (ref 0–44)
AST: 34 U/L (ref 15–41)
Albumin: 3.7 g/dL (ref 3.5–5.0)
Alkaline Phosphatase: 44 U/L (ref 38–126)
Bilirubin, Direct: <0.1 mg/dL (ref 0.0–0.2)
Total Bilirubin: 0.3 mg/dL (ref 0.3–1.2)
Total Protein: 6.6 g/dL (ref 6.5–8.1)

## 2019-07-21 LAB — GLUCOSE, CAPILLARY: Glucose-Capillary: 109 mg/dL — ABNORMAL HIGH (ref 70–99)

## 2019-07-21 LAB — CBC
HCT: 34.7 % — ABNORMAL LOW (ref 36.0–46.0)
Hemoglobin: 10.6 g/dL — ABNORMAL LOW (ref 12.0–15.0)
MCH: 24.3 pg — ABNORMAL LOW (ref 26.0–34.0)
MCHC: 30.5 g/dL (ref 30.0–36.0)
MCV: 79.4 fL — ABNORMAL LOW (ref 80.0–100.0)
Platelets: 376 10*3/uL (ref 150–400)
RBC: 4.37 MIL/uL (ref 3.87–5.11)
RDW: 13.2 % (ref 11.5–15.5)
WBC: 9.2 10*3/uL (ref 4.0–10.5)
nRBC: 0 % (ref 0.0–0.2)

## 2019-07-21 NOTE — Progress Notes (Signed)
PCP - Dr. Rachell Cipro Cardiologist - denies  PPM/ICD - denies Device Orders - N/A Rep Notified - N/A  Chest x-ray - N/A EKG - N/A Stress Test - N/A  ECHO - per patient, a TEE was done in 2013 - records requested Cardiac Cath - denies  Sleep Study - denies CPAP - N/A  Fasting Blood Sugar - per patient pre-diabetic, does not check blood sugar nor takes DM medications  CBG at PAT visit - 109  Blood Thinner Instructions: N/A Aspirin Instructions: N/A  ERAS Protcol - No PRE-SURGERY Ensure or G2- N/A  COVID TEST- Scheduled for 07/25/2019. Patient verbalized understanding of appointment time and place, and self-quarantine instructions.  Anesthesia review: YES, records requested, hx of prothrombin gene mutation  Patient denies shortness of breath, fever, cough and chest pain at PAT appointment  All instructions explained to the patient, with a verbal understanding of the material. Patient agrees to go over the instructions while at home for a better understanding. Patient also instructed to self quarantine after being tested for COVID-19. The opportunity to ask questions was provided.

## 2019-07-21 NOTE — Progress Notes (Signed)
[facility] [address] [facility], [place] [address] Phone: [phone] Fax: [phone]  [facility] [place] [place] [address] Phone: [phone] Fax: [phone]    Your procedure is scheduled on Thursday, November 12th.  Report to [facility] Main Entrance "A" at 7:00 A.M., and check in at the Admitting office.  Call this number if you have problems the morning of surgery:  [phone]  Call [phone] if you have any questions prior to your surgery date Monday-Friday 8am-4pm   Remember:  Do not eat or drink after midnight the night before your surgery    Take these medicines the morning of surgery with A SIP OF WATER  - NONE  As of today, STOP taking any Aspirin (unless otherwise instructed by your surgeon), Aleve, Naproxen, Ibuprofen, Motrin, Advil, Goody's, BC's, all herbal medications, fish oil, and all vitamins.   The Morning of Surgery  Do not wear jewelry, make-up or nail polish.  Do not wear lotions, powders, or perfumes, or deodorant  Do not shave 48 hours prior to surgery.  Do not bring valuables to the hospital.  [facility] is not responsible for any belongings or valuables.  If you are a smoker, DO NOT Smoke 24 hours prior to surgery IF you wear a CPAP at night please bring your mask, tubing, and machine the morning of surgery   Remember that you must have someone to transport you home after your surgery, and remain with you for 24 hours if you are discharged the same day.  Contacts, glasses, hearing aids, dentures or bridgework may not be worn into surgery.   Leave your suitcase in the car.  After surgery it may be brought to your room.  For patients admitted to the hospital, discharge time will be determined by your treatment team.  Patients discharged the day of surgery will not be allowed to drive home.   Special instructions:    [facility]- Preparing For Surgery  Before surgery, you can play an important role. Because skin is not sterile, your skin needs to be as free of germs as possible. You can reduce the number of germs on your skin by washing with CHG (chlorahexidine gluconate) Soap before surgery.  CHG is an antiseptic cleaner which kills germs and bonds with the skin to continue killing germs even after washing.    Oral Hygiene is also important to reduce your risk of infection.  Remember - BRUSH YOUR TEETH THE MORNING OF SURGERY WITH YOUR REGULAR TOOTHPASTE  Please do not use if you have an allergy to CHG or antibacterial soaps. If your skin becomes reddened/irritated stop using the CHG.  Do not shave (including legs and underarms) for at least 48 hours prior to first CHG shower. It is OK to shave your face.  Please follow these instructions carefully.   1. Shower the NIGHT BEFORE SURGERY and the MORNING OF SURGERY with CHG Soap.   2. If you chose to wash your hair, wash your hair first as usual with your normal shampoo.  3. After you shampoo, rinse your hair and body thoroughly to remove the shampoo.  4. Use CHG as you would any other liquid soap. You can apply CHG directly to the skin and wash gently with a scrungie or a clean washcloth.   5. Apply the CHG Soap to your body ONLY FROM THE NECK DOWN.  Do  not use on open wounds or open sores. Avoid contact with your eyes, ears, mouth and genitals (private parts). Wash Face and genitals (private parts)  with your normal soap.   6. Wash thoroughly, paying special attention to the area where your surgery will be performed.  7. Thoroughly rinse your body with warm water from the neck down.  8. DO NOT shower/wash with your normal soap after using and rinsing off the CHG Soap.  9. Pat yourself dry with a CLEAN TOWEL.  10. Wear CLEAN PAJAMAS to bed the night before surgery, wear comfortable clothes the morning of surgery  11. Place CLEAN SHEETS on your bed  the night of your first shower and DO NOT SLEEP WITH PETS.    Day of Surgery:  Do not apply any deodorants/lotions. Please shower the morning of surgery with the CHG soap  Please wear clean clothes to the hospital/surgery center.   Remember to brush your teeth WITH YOUR REGULAR TOOTHPASTE.   Please read over the following fact sheets that you were given.

## 2019-07-22 NOTE — Anesthesia Preprocedure Evaluation (Addendum)
Anesthesia Evaluation  Patient identified by MRN, date of birth, ID band Patient awake    Reviewed: Allergy & Precautions, NPO status , Patient's Chart, lab work & pertinent test results  History of Anesthesia Complications (+) PONV and history of anesthetic complications  Airway Mallampati: II  TM Distance: >3 FB Neck ROM: Full    Dental no notable dental hx.    Pulmonary former smoker,    Pulmonary exam normal        Cardiovascular negative cardio ROS Normal cardiovascular exam     Neuro/Psych negative neurological ROS  negative psych ROS   GI/Hepatic negative GI ROS, Neg liver ROS,   Endo/Other  Morbid obesity  Renal/GU negative Renal ROS  negative genitourinary   Musculoskeletal negative musculoskeletal ROS (+)   Abdominal   Peds  Hematology  (+) anemia , Hgb 10.6   Anesthesia Other Findings Day of surgery medications reviewed with patient.  Reproductive/Obstetrics negative OB ROS                           Anesthesia Physical Anesthesia Plan  ASA: III  Anesthesia Plan: General   Post-op Pain Management:    Induction: Intravenous  PONV Risk Score and Plan: 4 or greater and Treatment may vary due to age or medical condition, Ondansetron, Dexamethasone, Midazolam and TIVA  Airway Management Planned: LMA  Additional Equipment: None  Intra-op Plan:   Post-operative Plan: Extubation in OR  Informed Consent: I have reviewed the patients History and Physical, chart, labs and discussed the procedure including the risks, benefits and alternatives for the proposed anesthesia with the patient or authorized representative who has indicated his/her understanding and acceptance.     Dental advisory given  Plan Discussed with: CRNA  Anesthesia Plan Comments: (History of blood clot in her thumb, ? Venous or arterial in 2012 . Admitted to the hospital in [facility] and was  evaluated by hematology . Thought to be triggered by multiple factors including smoking , oral contraceptive pills , obesity , prothrombin gene mutation . Patient reports she was on IV heparin briefly while hospitalized and then was switched to aspirin which she took for several months . Has not been on any anticoagulation since then . She reports having had a detailed workup including a trans-esophageal ECHO, lower extremity venous ultrasounds, ultrasound of the abdomen, MRA which showed no other overt abnormalities as per her understanding. These records were requested but the facility where the patient thought the tests were done does not have record of the pt. Pt continues to follow with hematology, Dr. Irene Limbo.  Preop labs reviewed, mild anemia with Hgb 10.6. Mild ALT elevation at 49 consistent with pt history of fatty liver. )    Anesthesia Quick Evaluation

## 2019-07-25 ENCOUNTER — Other Ambulatory Visit (HOSPITAL_COMMUNITY)
Admission: RE | Admit: 2019-07-25 | Discharge: 2019-07-25 | Disposition: A | Discharge status: Home / Self Care | Payer: Managed Care, Other (non HMO) | Source: Ambulatory Visit | Attending: Obstetrics & Gynecology | Admitting: Obstetrics & Gynecology

## 2019-07-25 DIAGNOSIS — Z01812 Encounter for preprocedural laboratory examination: Secondary | ICD-10-CM | POA: Diagnosis present

## 2019-07-25 DIAGNOSIS — Z20828 Contact with and (suspected) exposure to other viral communicable diseases: Secondary | ICD-10-CM | POA: Diagnosis not present

## 2019-07-25 NOTE — Progress Notes (Signed)
Anesthesia Chart Review: History of blood clot in her thumb, ? Venous or arterial in 2012 . Admitted to the hospital in [facility] and was evaluated by hematology . Thought to be triggered by multiple factors including smoking , oral contraceptive pills , obesity , prothrombin gene mutation . Patient reports she was on IV heparin briefly while hospitalized and then was switched to aspirin which she took for several months . Has not been on any anticoagulation since then . She reports having had a detailed workup including a trans-esophageal ECHO, lower extremity venous ultrasounds, ultrasound of the abdomen, MRA which showed no other overt abnormalities as per her understanding. These records were requested but the facility where the patient thought the tests were done does not have record of the pt. Pt continues to follow with hematology, Dr. Irene Limbo.  Preop labs reviewed, mild anemia with Hgb 10.6. Mild ALT elevation at 49 consistent with pt history of fatty liver.    Wynonia Musty [facility]/Anesthesiology Phone [phone] 07/25/2019 8:58 AM

## 2019-07-26 LAB — NOVEL CORONAVIRUS, NAA (HOSP ORDER, SEND-OUT TO REF LAB; TAT 18-24 HRS): SARS-CoV-2, NAA: NOT DETECTED

## 2019-07-28 ENCOUNTER — Encounter (HOSPITAL_COMMUNITY): Payer: Self-pay

## 2019-07-28 ENCOUNTER — Encounter (HOSPITAL_COMMUNITY)
Admission: RE | Disposition: A | Discharge status: Home / Self Care | Payer: Self-pay | Source: Home / Self Care | Attending: Obstetrics & Gynecology

## 2019-07-28 ENCOUNTER — Ambulatory Visit (HOSPITAL_COMMUNITY): Payer: Managed Care, Other (non HMO) | Admitting: Anesthesiology

## 2019-07-28 ENCOUNTER — Ambulatory Visit (HOSPITAL_COMMUNITY): Payer: Managed Care, Other (non HMO) | Admitting: Physician Assistant

## 2019-07-28 ENCOUNTER — Ambulatory Visit (HOSPITAL_COMMUNITY)
Admission: RE | Admit: 2019-07-28 | Discharge: 2019-07-28 | Disposition: A | Discharge status: Home / Self Care | Payer: Managed Care, Other (non HMO) | Attending: Obstetrics & Gynecology | Admitting: Obstetrics & Gynecology

## 2019-07-28 ENCOUNTER — Other Ambulatory Visit: Payer: Self-pay

## 2019-07-28 DIAGNOSIS — Z87891 Personal history of nicotine dependence: Secondary | ICD-10-CM | POA: Insufficient documentation

## 2019-07-28 DIAGNOSIS — R7303 Prediabetes: Secondary | ICD-10-CM | POA: Insufficient documentation

## 2019-07-28 DIAGNOSIS — D6852 Prothrombin gene mutation: Secondary | ICD-10-CM | POA: Insufficient documentation

## 2019-07-28 DIAGNOSIS — Z791 Long term (current) use of non-steroidal anti-inflammatories (NSAID): Secondary | ICD-10-CM | POA: Diagnosis not present

## 2019-07-28 DIAGNOSIS — K589 Irritable bowel syndrome without diarrhea: Secondary | ICD-10-CM | POA: Insufficient documentation

## 2019-07-28 DIAGNOSIS — Z8249 Family history of ischemic heart disease and other diseases of the circulatory system: Secondary | ICD-10-CM | POA: Diagnosis not present

## 2019-07-28 DIAGNOSIS — Z833 Family history of diabetes mellitus: Secondary | ICD-10-CM | POA: Diagnosis not present

## 2019-07-28 DIAGNOSIS — Z888 Allergy status to other drugs, medicaments and biological substances status: Secondary | ICD-10-CM | POA: Diagnosis not present

## 2019-07-28 DIAGNOSIS — D649 Anemia, unspecified: Secondary | ICD-10-CM | POA: Diagnosis not present

## 2019-07-28 DIAGNOSIS — K760 Fatty (change of) liver, not elsewhere classified: Secondary | ICD-10-CM | POA: Diagnosis not present

## 2019-07-28 DIAGNOSIS — Z793 Long term (current) use of hormonal contraceptives: Secondary | ICD-10-CM | POA: Insufficient documentation

## 2019-07-28 DIAGNOSIS — N840 Polyp of corpus uteri: Secondary | ICD-10-CM | POA: Insufficient documentation

## 2019-07-28 DIAGNOSIS — N939 Abnormal uterine and vaginal bleeding, unspecified: Secondary | ICD-10-CM | POA: Diagnosis present

## 2019-07-28 DIAGNOSIS — Z6841 Body Mass Index (BMI) 40.0 and over, adult: Secondary | ICD-10-CM | POA: Insufficient documentation

## 2019-07-28 HISTORY — PX: DILATATION & CURRETTAGE/HYSTEROSCOPY WITH RESECTOCOPE: SHX5572

## 2019-07-28 LAB — POCT PREGNANCY, URINE: Preg Test, Ur: NEGATIVE

## 2019-07-28 LAB — GLUCOSE, CAPILLARY: Glucose-Capillary: 123 mg/dL — ABNORMAL HIGH (ref 70–99)

## 2019-07-28 SURGERY — DILATATION & CURETTAGE/HYSTEROSCOPY WITH RESECTOCOPE
Anesthesia: General | Site: Vagina

## 2019-07-28 MED ORDER — ACETAMINOPHEN 500 MG PO TABS
1000.0000 mg | ORAL_TABLET | Freq: Once | ORAL | Status: AC
  Administered 2019-07-28: 1000 mg via ORAL
  Filled 2019-07-28: qty 2

## 2019-07-28 MED ORDER — FENTANYL CITRATE (PF) 100 MCG/2ML IJ SOLN
INTRAMUSCULAR | Status: DC | PRN
  Administered 2019-07-28: 25 ug via INTRAVENOUS
  Administered 2019-07-28: 50 ug via INTRAVENOUS
  Administered 2019-07-28: 25 ug via INTRAVENOUS

## 2019-07-28 MED ORDER — LIDOCAINE HCL 1 % IJ SOLN
INTRAMUSCULAR | Status: AC
  Filled 2019-07-28: qty 20

## 2019-07-28 MED ORDER — OXYCODONE HCL 5 MG PO TABS: 5.0000 mg | ORAL_TABLET | Freq: Four times a day (QID) | ORAL | 0 refills | Status: DC | PRN

## 2019-07-28 MED ORDER — OXYCODONE HCL 5 MG PO TABS: 5.0000 mg | ORAL_TABLET | Freq: Once | ORAL | Status: DC | PRN

## 2019-07-28 MED ORDER — PROPOFOL 10 MG/ML IV BOLUS
INTRAVENOUS | Status: DC | PRN
  Administered 2019-07-28: 200 mg via INTRAVENOUS

## 2019-07-28 MED ORDER — KETOROLAC TROMETHAMINE 30 MG/ML IJ SOLN: 30.0000 mg | Freq: Once | INTRAMUSCULAR | Status: DC | PRN

## 2019-07-28 MED ORDER — PROMETHAZINE HCL 25 MG/ML IJ SOLN: 6.2500 mg | INTRAMUSCULAR | Status: DC | PRN

## 2019-07-28 MED ORDER — FENTANYL CITRATE (PF) 100 MCG/2ML IJ SOLN: 25.0000 ug | INTRAMUSCULAR | Status: DC | PRN

## 2019-07-28 MED ORDER — FENTANYL CITRATE (PF) 250 MCG/5ML IJ SOLN
INTRAMUSCULAR | Status: AC
  Filled 2019-07-28: qty 5

## 2019-07-28 MED ORDER — ROCURONIUM BROMIDE 10 MG/ML (PF) SYRINGE
PREFILLED_SYRINGE | INTRAVENOUS | Status: AC
  Filled 2019-07-28: qty 10

## 2019-07-28 MED ORDER — LIDOCAINE HCL 1 % IJ SOLN
INTRAMUSCULAR | Status: DC | PRN
  Administered 2019-07-28: 10 mL

## 2019-07-28 MED ORDER — LACTATED RINGERS IV SOLN
INTRAVENOUS | Status: DC
  Administered 2019-07-28: 09:00:00 via INTRAVENOUS

## 2019-07-28 MED ORDER — PROPOFOL 10 MG/ML IV BOLUS
INTRAVENOUS | Status: AC
  Filled 2019-07-28: qty 20

## 2019-07-28 MED ORDER — ONDANSETRON HCL 4 MG/2ML IJ SOLN
INTRAMUSCULAR | Status: DC | PRN
  Administered 2019-07-28: 4 mg via INTRAVENOUS

## 2019-07-28 MED ORDER — MIDAZOLAM HCL 2 MG/2ML IJ SOLN
INTRAMUSCULAR | Status: AC
  Filled 2019-07-28: qty 2

## 2019-07-28 MED ORDER — IBUPROFEN 800 MG PO TABS: 800.0000 mg | ORAL_TABLET | Freq: Three times a day (TID) | ORAL | 0 refills | Status: DC | PRN

## 2019-07-28 MED ORDER — MIDAZOLAM HCL 5 MG/5ML IJ SOLN
INTRAMUSCULAR | Status: DC | PRN
  Administered 2019-07-28: 2 mg via INTRAVENOUS

## 2019-07-28 MED ORDER — DEXAMETHASONE SODIUM PHOSPHATE 10 MG/ML IJ SOLN
INTRAMUSCULAR | Status: DC | PRN
  Administered 2019-07-28: 4 mg via INTRAVENOUS

## 2019-07-28 MED ORDER — OXYCODONE HCL 5 MG/5ML PO SOLN: 5.0000 mg | Freq: Once | ORAL | Status: DC | PRN

## 2019-07-28 MED ORDER — ONDANSETRON HCL 4 MG/2ML IJ SOLN
INTRAMUSCULAR | Status: AC
  Filled 2019-07-28: qty 2

## 2019-07-28 MED ORDER — PROPOFOL 500 MG/50ML IV EMUL
INTRAVENOUS | Status: DC | PRN
  Administered 2019-07-28: 120 ug/kg/min via INTRAVENOUS

## 2019-07-28 MED ORDER — LIDOCAINE 2% (20 MG/ML) 5 ML SYRINGE
INTRAMUSCULAR | Status: AC
  Filled 2019-07-28: qty 5

## 2019-07-28 MED ORDER — LIDOCAINE 2% (20 MG/ML) 5 ML SYRINGE
INTRAMUSCULAR | Status: DC | PRN
  Administered 2019-07-28: 40 mg via INTRAVENOUS
  Administered 2019-07-28: 60 mg via INTRAVENOUS

## 2019-07-28 SURGICAL SUPPLY — 15 items
BIPOLAR CUTTING LOOP 21FR (ELECTRODE)
CATH ROBINSON RED A/P 16FR (CATHETERS) ×3 IMPLANT
ELECT REM PT RETURN 9FT ADLT (ELECTROSURGICAL)
ELECTRODE REM PT RTRN 9FT ADLT (ELECTROSURGICAL) IMPLANT
GLOVE BIO SURGEON STRL SZ 6.5 (GLOVE) ×2 IMPLANT
GLOVE BIO SURGEONS STRL SZ 6.5 (GLOVE) ×1
GLOVE BIOGEL PI IND STRL 7.0 (GLOVE) ×2 IMPLANT
GLOVE BIOGEL PI INDICATOR 7.0 (GLOVE) ×4
GOWN STRL REUS W/ TWL LRG LVL3 (GOWN DISPOSABLE) ×2 IMPLANT
GOWN STRL REUS W/TWL LRG LVL3 (GOWN DISPOSABLE) ×4
KIT PROCEDURE FLUENT (KITS) ×3 IMPLANT
LOOP CUTTING BIPOLAR 21FR (ELECTRODE) IMPLANT
PACK VAGINAL MINOR WOMEN LF (CUSTOM PROCEDURE TRAY) ×3 IMPLANT
PAD OB MATERNITY 4.3X12.25 (PERSONAL CARE ITEMS) ×3 IMPLANT
TOWEL GREEN STERILE FF (TOWEL DISPOSABLE) ×6 IMPLANT

## 2019-07-28 NOTE — Anesthesia Postprocedure Evaluation (Signed)
Anesthesia Post Note  Patient: Leslie Branch  Procedure(s) Performed: DILATATION & CURETTAGE/HYSTEROSCOPY WITH RESECTOCOPE (N/A Vagina )     Patient location during evaluation: PACU Anesthesia Type: General Level of consciousness: awake and alert and oriented Pain management: pain level controlled Vital Signs Assessment: post-procedure vital signs reviewed and stable Respiratory status: spontaneous breathing, nonlabored ventilation and respiratory function stable Cardiovascular status: blood pressure returned to baseline Postop Assessment: no apparent nausea or vomiting Anesthetic complications: no    Last Vitals:  Vitals:   07/28/19 1007 07/28/19 1024  BP: 131/75 121/69  Pulse: (!) 102 79  Resp: (!) 22 (!) 21  Temp: 36.7 C 36.8 C  SpO2: 95% 98%    Last Pain:  Vitals:   07/28/19 1007  TempSrc:   PainSc: 0-No pain                 Brennan Bailey

## 2019-07-28 NOTE — Op Note (Signed)
OPERATIVE REPORT  Leslie Branch female 33 y.o. 07/28/19   Pre-operative Diagnosis: Abnormal uterine bleeding  Post-operative Diagnosis: same  Surgeon: Juliann Mule) and Role:    Sanjuana Kava, MD - Primary   Assistant:  None  Procedure   * DILATATION & CURETTAGE/HYSTEROSCOPY   Anesthesia:  General LMA  ASA Class: 2  EBL: 20 mL  UOP: 100 mL  IVF: 500 mL  Specimen: Endometrial curettings  Findings: EUA: Normal vulva, normal appearing cervix hysteroscopy: polypoid tissue in endometrium, hypertrophic endometrium. Tubal ostia not visualized  Complications: None  After adequate anesthesia was achieved, the patient was prepped and draped in the usual sterile fashion.  The Graves operative speculum was placed in the vagina and the cervix stabilized with a single-tooth tenaculum.  A paracervical block was performed with 10 mL of 1% plain lidocaine. The uterus was sounded to 10 cm. The  cervix was dilated with Hank dilators to 16 Fr.  The hysteroscope was passed inside the endometrial cavity; Normal saline was used as a distending medium. The above findings were noted and sharp curettage was then performed and uterine curettings sent to pathology.  All instruments were removed from the vagina. Counts were correct.  The patient tolerated the procedure well and was transferred to the recovery room in good condition.  [name] [name]

## 2019-07-28 NOTE — H&P (Signed)
Leslie Branch is an 33 y.o. female  P1 with 2 months of abnormal uterine bleeding on progesterone.  She notes flooding and passing of clots when not taking the progesterone  Pertinent Gynecological History: Menses: flow is excessive with use of 5 pads or tampons on heaviest days Bleeding: dysfunctional uterine bleeding Contraception: none DES exposure: denies Blood transfusions: none Sexually transmitted diseases: no past history Previous GYN Procedures: none  Last pap: normal Date: 2020 OB History: G1, P1   Menstrual History: Menarche age: 70 No LMP recorded.    Past Medical History:  Diagnosis Date  . Anemia   . Blood clot in vein    right hand - thumb  . Fatty liver   . IBS (irritable bowel syndrome)   . PONV (postoperative nausea and vomiting)   . Pre-diabetes    medical hx  . 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 Termination of pregnancy (fetus)    x 3 in clinics    Past Surgical History:  Procedure Laterality Date  . COLONOSCOPY    . DILATION AND EVACUATION N/A 07/22/2017   Procedure: DILATATION AND EVACUATION WITH GENETICS;  Surgeon: Tyson Dense, MD;  Location: [facility];  Service: Gynecology;  Laterality: N/A;  . HAND SURGERY Left    pinky - reconstructive  . KNEE ARTHROSCOPY Left   . TONSILLECTOMY AND ADENOIDECTOMY     as a child  . tubes in ears     as a child  . UPPER GI ENDOSCOPY      Family History  Problem Relation Age of Onset  . Hypertension Mother   . Hyperlipidemia Mother   . Diabetes Father   . Diabetes Sister     Social History:  reports that she quit smoking about 8 years ago. Her smoking use included cigarettes. She has a 10.00 pack-year smoking history. She has never used smokeless tobacco. She reports current alcohol use of about 1.0 standard drinks of alcohol per week. She reports that she does not use drugs.  Allergies:  Allergies  Allergen Reactions  . Chloraseptic Sore Throat [Acetaminophen] Anaphylaxis   Closed throat when used spray    Medications Prior to Admission  Medication Sig Dispense Refill Last Dose  . ibuprofen (ADVIL) 200 MG tablet Take 400 mg by mouth every 8 (eight) hours as needed (for pain.).   07/20/2019  . norethindrone (AYGESTIN) 5 MG tablet Take 5-15 mg by mouth See admin instructions. TAKE THESE NUMBER OF TABLETS ON CONSECUTIVE DAYS:3-3-3-2-2-2-1-1-1 THEN DISCONTINUE USE   07/26/2019    ROS as per HPI  Blood pressure (!) 160/85, pulse 84, temperature 98.3 F (36.8 C), temperature source Oral, resp. rate 18, height 5\' 9"  (1.753 m), weight 134.7 kg, SpO2 100 %, unknown if currently breastfeeding. Physical Exam Deferred to OR  No results found for this or any previous visit (from the past 24 hour(s)).  No results found.  Assessment/Plan: On call to OR for D&C hysteroscopy Reviewed risks of surgery and patient voiced understanding.  Consent signed and witnessed and placed into chart.   Garon Melander, [place] 07/28/2019, 8:54 AM

## 2019-07-28 NOTE — Transfer of Care (Signed)
Immediate Anesthesia Transfer of Care Note  Patient: Leslie Branch  Procedure(s) Performed: DILATATION & CURETTAGE/HYSTEROSCOPY WITH RESECTOCOPE (N/A Vagina )  Patient Location: PACU  Anesthesia Type:General  Level of Consciousness: awake  Airway & Oxygen Therapy: Patient Spontanous Breathing  Post-op Assessment: Report given to RN and Post -op Vital signs reviewed and stable  Post vital signs: Reviewed and stable  Last Vitals:  Vitals Value Taken Time  BP 131/75 07/28/19 1007  Temp    Pulse 99 07/28/19 1008  Resp 17 07/28/19 1008  SpO2 97 % 07/28/19 1008  Vitals shown include unvalidated device data.  Last Pain:  Vitals:   07/28/19 0811  TempSrc:   PainSc: 0-No pain      Patients Stated Pain Goal: 5 (70/35/00 9381)  Complications: No apparent anesthesia complications

## 2019-07-28 NOTE — Anesthesia Procedure Notes (Signed)
Procedure Name: LMA Insertion °Performed by: Rene Sizelove H, CRNA °Pre-anesthesia Checklist: Patient identified, Emergency Drugs available, Suction available and Patient being monitored °Patient Re-evaluated:Patient Re-evaluated prior to induction °Oxygen Delivery Method: Circle System Utilized °Preoxygenation: Pre-oxygenation with 100% oxygen °Induction Type: IV induction °Ventilation: Mask ventilation without difficulty °LMA: LMA inserted °LMA Size: 4.0 °Number of attempts: 1 °Airway Equipment and Method: Bite block °Placement Confirmation: positive ETCO2 °Tube secured with: Tape °Dental Injury: Teeth and Oropharynx as per pre-operative assessment  ° ° ° ° ° ° °

## 2019-07-29 ENCOUNTER — Encounter (HOSPITAL_COMMUNITY): Payer: Self-pay | Admitting: Obstetrics & Gynecology

## 2019-07-29 LAB — SURGICAL PATHOLOGY

## 2019-11-25 ENCOUNTER — Ambulatory Visit: Payer: Managed Care, Other (non HMO) | Attending: Internal Medicine

## 2019-11-25 DIAGNOSIS — Z23 Encounter for immunization: Secondary | ICD-10-CM

## 2019-11-25 NOTE — Progress Notes (Signed)
   Covid-19 Vaccination Clinic  Name:  Leslie Branch    MRN: [id number] DOB: 1986-07-07  11/25/2019  Leslie Branch was observed post Covid-19 immunization for 30 minutes based on pre-vaccination screening without incident. She was provided with Vaccine Information Sheet and instruction to access the V-Safe system.   Leslie Branch was instructed to call 911 with any severe reactions post vaccine: Marland Kitchen Difficulty breathing  . Swelling of face and throat  . A fast heartbeat  . A bad rash all over body  . Dizziness and weakness   Immunizations Administered    Name Date Dose VIS Date Route   Pfizer COVID-19 Vaccine 11/25/2019  2:18 PM 0.3 mL 08/26/2019 Intramuscular   Manufacturer: [employer], [employer]   Lot: [id number]   NDC: [address]-0

## 2019-12-20 ENCOUNTER — Ambulatory Visit: Payer: Managed Care, Other (non HMO) | Attending: Internal Medicine

## 2019-12-20 DIAGNOSIS — Z23 Encounter for immunization: Secondary | ICD-10-CM

## 2019-12-20 NOTE — Progress Notes (Signed)
   Covid-19 Vaccination Clinic  Name:  Leslie Branch    MRN: [id number] DOB: 1986-07-30  12/20/2019  [name] was observed post Covid-19 immunization for 30 minutes based on pre-vaccination screening without incident. She was provided with Vaccine Information Sheet and instruction to access the V-Safe system.   [name] was instructed to call 911 with any severe reactions post vaccine: Marland Kitchen Difficulty breathing  . Swelling of face and throat  . A fast heartbeat  . A bad rash all over body  . Dizziness and weakness   Immunizations Administered    Name Date Dose VIS Date Route   Pfizer COVID-19 Vaccine 12/20/2019  9:24 AM 0.3 mL 08/26/2019 Intramuscular   Manufacturer: [employer], [employer]   Lot: [id number]   NDC: [address]-6

## 2020-02-06 ENCOUNTER — Ambulatory Visit: Payer: Self-pay

## 2020-02-06 ENCOUNTER — Encounter: Payer: Self-pay | Admitting: Family Medicine

## 2020-02-06 ENCOUNTER — Ambulatory Visit (INDEPENDENT_AMBULATORY_CARE_PROVIDER_SITE_OTHER): Payer: Managed Care, Other (non HMO) | Admitting: Family Medicine

## 2020-02-06 ENCOUNTER — Other Ambulatory Visit: Payer: Self-pay

## 2020-02-06 VITALS — BP 141/96 | HR 89 | Ht 69.0 in | Wt 298.0 lb

## 2020-02-06 DIAGNOSIS — M79671 Pain in right foot: Secondary | ICD-10-CM

## 2020-02-06 DIAGNOSIS — M722 Plantar fascial fibromatosis: Secondary | ICD-10-CM | POA: Diagnosis not present

## 2020-02-06 MED ORDER — METHYLPREDNISOLONE ACETATE 40 MG/ML IJ SUSP
40.0000 mg | Freq: Once | INTRAMUSCULAR | Status: AC
  Administered 2020-02-06: 40 mg via INTRA_ARTICULAR

## 2020-02-06 NOTE — Assessment & Plan Note (Signed)
Findings on ultrasound suggest plantar fasciitis.  She does have pain afterwards which may be more of a stress reaction type of pain. -Injection. -Counseled on home exercise therapy and supportive care. -Midfoot strap. -If no improvement with injection would have low threshold to order MRI for evaluation of stress reaction versus fracture.

## 2020-02-06 NOTE — Patient Instructions (Signed)
Good to see you Please try the stretches  Please try ice  Please try the midfoot strap  Please let me know if you get improvement with the injection, if not we may need to consider a stress fracture  Please send me a message in MyChart with any questions or updates.  Please see me back in 4 weeks.   --Dr. Jordan Likes

## 2020-02-06 NOTE — Progress Notes (Signed)
Leslie Branch - 34 y.o. female MRN [id number]  Date of birth: [birth year]  SUBJECTIVE:  Including CC & ROS.  Chief Complaint  Patient presents with  . Foot Pain    right    Leslie Branch is a 34 y.o. female that is presenting with right heel pain.  Pain is been ongoing for 6 months.  She had a baby last February.  She has started getting back to walking and running.  She wants to run a half marathon next February.  The pain is worse after walking and is localized to the base of the heel.  She does not have pain with the first few steps in the morning.  She is in no pain while walking.  It is only afterwards.  She has tried ibuprofen.   Review of Systems See HPI   HISTORY: Past Medical, Surgical, Social, and Family History Reviewed & Updated per EMR.   Pertinent Historical Findings include:  Past Medical History:  Diagnosis Date  . Anemia   . Blood clot in vein    right hand - thumb  . Fatty liver   . IBS (irritable bowel syndrome)   . PONV (postoperative nausea and vomiting)   . Pre-diabetes    medical hx  . 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 Termination of pregnancy (fetus)    x 3 in clinics    Past Surgical History:  Procedure Laterality Date  . COLONOSCOPY    . DILATATION & CURRETTAGE/HYSTEROSCOPY WITH RESECTOCOPE N/A 07/28/2019   Procedure: DILATATION & CURETTAGE/HYSTEROSCOPY WITH RESECTOCOPE;  Surgeon: Sanjuana Kava, MD;  Location: [facility];  Service: Gynecology;  Laterality: N/A;  . DILATION AND EVACUATION N/A 07/22/2017   Procedure: DILATATION AND EVACUATION WITH GENETICS;  Surgeon: Tyson Dense, MD;  Location: [facility];  Service: Gynecology;  Laterality: N/A;  . HAND SURGERY Left    pinky - reconstructive  . KNEE ARTHROSCOPY Left   . TONSILLECTOMY AND ADENOIDECTOMY     as a child  . tubes in ears     as a child  . UPPER GI ENDOSCOPY      Family History  Problem Relation Age of Onset  . Hypertension Mother   . Hyperlipidemia Mother   .  Diabetes Father   . Diabetes Sister     Social History   Socioeconomic History  . Marital status: Married    Spouse name: Not on file  . Number of children: Not on file  . Years of education: Not on file  . Highest education level: Not on file  Occupational History  . Not on file  Tobacco Use  . Smoking status: Former Smoker    Packs/day: 1.00    Years: 10.00    Pack years: 10.00    Types: Cigarettes    Quit date: 09/15/2010    Years since quitting: 9.4  . Smokeless tobacco: Never Used  Substance and Sexual Activity  . Alcohol use: Yes    Alcohol/week: 1.0 standard drinks    Types: 1 Cans of beer per week    Comment: rare use  . Drug use: No  . Sexual activity: Yes    Birth control/protection: None    Comment: approx [redacted] wks gestation per patient   Other Topics Concern  . Not on file  Social History Narrative  . Not on file   Social Determinants of Health   Financial Resource Strain:   . Difficulty of Paying Living Expenses:  Food Insecurity:   . Worried About Programme researcher, broadcasting/film/video in the Last Year:   . Barista in the Last Year:   Transportation Needs:   . Freight forwarder (Medical):   Marland Kitchen Lack of Transportation (Non-Medical):   Physical Activity:   . Days of Exercise per Week:   . Minutes of Exercise per Session:   Stress:   . Feeling of Stress :   Social Connections:   . Frequency of Communication with Friends and Family:   . Frequency of Social Gatherings with Friends and Family:   . Attends Religious Services:   . Active Member of Clubs or Organizations:   . Attends Banker Meetings:   Marland Kitchen Marital Status:   Intimate Partner Violence:   . Fear of Current or Ex-Partner:   . Emotionally Abused:   Marland Kitchen Physically Abused:   . Sexually Abused:      PHYSICAL EXAM:  VS: BP (!) 141/96   Pulse 89   Ht 5\' 9"  (1.753 m)   Wt 298 lb (135.2 kg)   BMI 44.01 kg/m  Physical Exam Gen: NAD, alert, cooperative with exam, well-appearing MSK:    Right foot: No swelling or ecchymosis. Tenderness to palpation of the plantar aspect of the heel. Normal ankle range of motion. Normal strength resistance. Neurovascularly intact  Limited ultrasound: Right heel:  The plantar fascia is thickened upon measurement.  No increased hyperemia. Normal-appearing posterior tibialis at the lateral malleolus and at its insertion. No changes of the subcutaneous at the medial calcaneus.  Summary: Findings would suggest plantar fasciitis.  Ultrasound and interpretation by , MD   Aspiration/Injection Procedure Note Leslie Branch Mar 24, 1986  Procedure: Injection Indications: Right heel pain  Procedure Details Consent: Risks of procedure as well as the alternatives and risks of each were explained to the (patient/caregiver).  Consent for procedure obtained. Time Out: Verified patient identification, verified procedure, site/side was marked, verified correct patient position, special equipment/implants available, medications/allergies/relevent history reviewed, required imaging and test results available.  Performed.  The area was cleaned with iodine and alcohol swabs.    The right plantar fashion was injected using 1 cc's of 40 mg Depo-Medrol and 1 cc's of 0.25% bupivacaine with a 25 1 1/2" needle.  Ultrasound was used. Images were obtained in long views showing the injection.     A sterile dressing was applied.  Patient did tolerate procedure well.     ASSESSMENT & PLAN:   Plantar fasciitis of right foot Findings on ultrasound suggest plantar fasciitis.  She does have pain afterwards which may be more of a stress reaction type of pain. -Injection. -Counseled on home exercise therapy and supportive care. -Midfoot strap. -If no improvement with injection would have low threshold to order MRI for evaluation of stress reaction versus fracture.

## 2020-03-02 ENCOUNTER — Other Ambulatory Visit: Payer: Self-pay | Admitting: Obstetrics & Gynecology

## 2020-03-02 DIAGNOSIS — N939 Abnormal uterine and vaginal bleeding, unspecified: Secondary | ICD-10-CM

## 2020-03-05 ENCOUNTER — Ambulatory Visit (INDEPENDENT_AMBULATORY_CARE_PROVIDER_SITE_OTHER): Payer: Managed Care, Other (non HMO) | Admitting: Family Medicine

## 2020-03-05 ENCOUNTER — Encounter: Payer: Self-pay | Admitting: Family Medicine

## 2020-03-05 ENCOUNTER — Other Ambulatory Visit: Payer: Self-pay

## 2020-03-05 DIAGNOSIS — M722 Plantar fascial fibromatosis: Secondary | ICD-10-CM | POA: Diagnosis not present

## 2020-03-05 NOTE — Patient Instructions (Signed)
Good to see you Please try the compression  Please continue the ice  Please continue the stretching   Please send me a message in MyChart with any questions or updates.  Please see me back in 4 weeks.   --Dr. Jordan Likes

## 2020-03-05 NOTE — Assessment & Plan Note (Signed)
Pain has improved but still ongoing.  May not be completely plantar fasciitis and may be a bit of the subtalar joint. -Counseled on home exercise therapy and supportive care. -Counseled on compression. -If no improvement will consider MRI

## 2020-03-05 NOTE — Progress Notes (Signed)
Leslie Branch - 34 y.o. female MRN [id number]  Date of birth: 1986-07-28  SUBJECTIVE:  Including CC & ROS.  Chief Complaint  Patient presents with  . Follow-up    RIGHT FOOT    Leslie Branch is a 34 y.o. female that is following up with right foot pain.  She received an injection has had improvement of her symptoms since that time.  She reports to having the pain worse at the end of the day but also when her foot is dangling off the bed.  She does continue to wear the midfoot arch strap.  She is starting to get back to running.   Review of Systems See HPI   HISTORY: Past Medical, Surgical, Social, and Family History Reviewed & Updated per EMR.   Pertinent Historical Findings include:  Past Medical History:  Diagnosis Date  . Anemia   . Blood clot in vein    right hand - thumb  . Fatty liver   . IBS (irritable bowel syndrome)   . PONV (postoperative nausea and vomiting)   . Pre-diabetes    medical hx  . 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 Termination of pregnancy (fetus)    x 3 in clinics    Past Surgical History:  Procedure Laterality Date  . COLONOSCOPY    . DILATATION & CURRETTAGE/HYSTEROSCOPY WITH RESECTOCOPE N/A 07/28/2019   Procedure: DILATATION & CURETTAGE/HYSTEROSCOPY WITH RESECTOCOPE;  Surgeon: Sanjuana Kava, MD;  Location: [facility];  Service: Gynecology;  Laterality: N/A;  . DILATION AND EVACUATION N/A 07/22/2017   Procedure: DILATATION AND EVACUATION WITH GENETICS;  Surgeon: Tyson Dense, MD;  Location: [facility];  Service: Gynecology;  Laterality: N/A;  . HAND SURGERY Left    pinky - reconstructive  . KNEE ARTHROSCOPY Left   . TONSILLECTOMY AND ADENOIDECTOMY     as a child  . tubes in ears     as a child  . UPPER GI ENDOSCOPY      Family History  Problem Relation Age of Onset  . Hypertension Mother   . Hyperlipidemia Mother   . Diabetes Father   . Diabetes Sister     Social History   Socioeconomic History  . Marital status:  Married    Spouse name: Not on file  . Number of children: Not on file  . Years of education: Not on file  . Highest education level: Not on file  Occupational History  . Not on file  Tobacco Use  . Smoking status: Former Smoker    Packs/day: 1.00    Years: 10.00    Pack years: 10.00    Types: Cigarettes    Quit date: 09/15/2010    Years since quitting: 9.4  . Smokeless tobacco: Never Used  Vaping Use  . Vaping Use: Never used  Substance and Sexual Activity  . Alcohol use: Yes    Alcohol/week: 1.0 standard drink    Types: 1 Cans of beer per week    Comment: rare use  . Drug use: No  . Sexual activity: Yes    Birth control/protection: None    Comment: approx [redacted] wks gestation per patient   Other Topics Concern  . Not on file  Social History Narrative  . Not on file   Social Determinants of Health   Financial Resource Strain:   . Difficulty of Paying Living Expenses:   Food Insecurity:   . Worried About Charity fundraiser in the Last Year:   .  Ran Out of Food in the Last Year:   Transportation Needs:   . Freight forwarder (Medical):   Marland Kitchen Lack of Transportation (Non-Medical):   Physical Activity:   . Days of Exercise per Week:   . Minutes of Exercise per Session:   Stress:   . Feeling of Stress :   Social Connections:   . Frequency of Communication with Friends and Family:   . Frequency of Social Gatherings with Friends and Family:   . Attends Religious Services:   . Active Member of Clubs or Organizations:   . Attends Banker Meetings:   Marland Kitchen Marital Status:   Intimate Partner Violence:   . Fear of Current or Ex-Partner:   . Emotionally Abused:   Marland Kitchen Physically Abused:   . Sexually Abused:      PHYSICAL EXAM:  VS: BP 127/79   Pulse 73   Ht 5\' 9"  (1.753 m)   Wt 300 lb (136.1 kg)   BMI 44.30 kg/m  Physical Exam Gen: NAD, alert, cooperative with exam, well-appearing MSK:  Right foot: No ecchymosis or swelling. Normal ankle range of  motion. Normal strength resistance. Neurovascularly intact     ASSESSMENT & PLAN:   Plantar fasciitis of right foot Pain has improved but still ongoing.  May not be completely plantar fasciitis and may be a bit of the subtalar joint. -Counseled on home exercise therapy and supportive care. -Counseled on compression. -If no improvement will consider MRI

## 2020-03-08 ENCOUNTER — Other Ambulatory Visit: Payer: Managed Care, Other (non HMO)

## 2020-03-16 ENCOUNTER — Ambulatory Visit
Admission: RE | Admit: 2020-03-16 | Discharge: 2020-03-16 | Disposition: A | Discharge status: Home / Self Care | Payer: Managed Care, Other (non HMO) | Source: Ambulatory Visit | Attending: Obstetrics & Gynecology | Admitting: Obstetrics & Gynecology

## 2020-03-16 DIAGNOSIS — N939 Abnormal uterine and vaginal bleeding, unspecified: Secondary | ICD-10-CM

## 2020-04-04 ENCOUNTER — Ambulatory Visit: Payer: Managed Care, Other (non HMO) | Admitting: Family Medicine

## 2020-04-12 ENCOUNTER — Other Ambulatory Visit: Payer: Self-pay

## 2020-04-12 ENCOUNTER — Encounter: Payer: Self-pay | Admitting: Family Medicine

## 2020-04-12 ENCOUNTER — Ambulatory Visit (INDEPENDENT_AMBULATORY_CARE_PROVIDER_SITE_OTHER): Payer: PRIVATE HEALTH INSURANCE | Admitting: Family Medicine

## 2020-04-12 DIAGNOSIS — M722 Plantar fascial fibromatosis: Secondary | ICD-10-CM | POA: Diagnosis not present

## 2020-04-12 NOTE — Assessment & Plan Note (Addendum)
Has had improvement with injection and compression.  Able to run with little to no pain. -Counseled on home exercise therapy and supportive care. -If pain is ongoing in the few months will consider MRI as she is training for half marathon.

## 2020-04-12 NOTE — Progress Notes (Signed)
Leslie Branch - 34 y.o. female MRN [id number]  Date of birth: 1986/08/07  SUBJECTIVE:  Including CC & ROS.  Chief Complaint  Patient presents with  . Follow-up    right foot    Leslie Branch is a 34 y.o. female that is following up for her right foot pain.  Has been doing well since injection as well as compression and home exercises.  She is able to run with little to no pain.   Review of Systems See HPI   HISTORY: Past Medical, Surgical, Social, and Family History Reviewed & Updated per EMR.   Pertinent Historical Findings include:  Past Medical History:  Diagnosis Date  . Anemia   . Blood clot in vein    right hand - thumb  . Fatty liver   . IBS (irritable bowel syndrome)   . PONV (postoperative nausea and vomiting)   . Pre-diabetes    medical hx  . 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 Termination of pregnancy (fetus)    x 3 in clinics    Past Surgical History:  Procedure Laterality Date  . COLONOSCOPY    . DILATATION & CURRETTAGE/HYSTEROSCOPY WITH RESECTOCOPE N/A 07/28/2019   Procedure: DILATATION & CURETTAGE/HYSTEROSCOPY WITH RESECTOCOPE;  Surgeon: Essie Hart, MD;  Location: [facility];  Service: Gynecology;  Laterality: N/A;  . DILATION AND EVACUATION N/A 07/22/2017   Procedure: DILATATION AND EVACUATION WITH GENETICS;  Surgeon: Ranae Pila, MD;  Location: [facility];  Service: Gynecology;  Laterality: N/A;  . HAND SURGERY Left    pinky - reconstructive  . KNEE ARTHROSCOPY Left   . TONSILLECTOMY AND ADENOIDECTOMY     as a child  . tubes in ears     as a child  . UPPER GI ENDOSCOPY      Family History  Problem Relation Age of Onset  . Hypertension Mother   . Hyperlipidemia Mother   . Diabetes Father   . Diabetes Sister     Social History   Socioeconomic History  . Marital status: Married    Spouse name: Not on file  . Number of children: Not on file  . Years of education: Not on file  . Highest education level: Not on file    Occupational History  . Not on file  Tobacco Use  . Smoking status: Former Smoker    Packs/day: 1.00    Years: 10.00    Pack years: 10.00    Types: Cigarettes    Quit date: 09/15/2010    Years since quitting: 9.5  . Smokeless tobacco: Never Used  Vaping Use  . Vaping Use: Never used  Substance and Sexual Activity  . Alcohol use: Yes    Alcohol/week: 1.0 standard drink    Types: 1 Cans of beer per week    Comment: rare use  . Drug use: No  . Sexual activity: Yes    Birth control/protection: None    Comment: approx [redacted] wks gestation per patient   Other Topics Concern  . Not on file  Social History Narrative  . Not on file   Social Determinants of Health   Financial Resource Strain:   . Difficulty of Paying Living Expenses:   Food Insecurity:   . Worried About Programme researcher, broadcasting/film/video in the Last Year:   . Barista in the Last Year:   Transportation Needs:   . Freight forwarder (Medical):   Marland Kitchen Lack of Transportation (Non-Medical):  Physical Activity:   . Days of Exercise per Week:   . Minutes of Exercise per Session:   Stress:   . Feeling of Stress :   Social Connections:   . Frequency of Communication with Friends and Family:   . Frequency of Social Gatherings with Friends and Family:   . Attends Religious Services:   . Active Member of Clubs or Organizations:   . Attends Banker Meetings:   Marland Kitchen Marital Status:   Intimate Partner Violence:   . Fear of Current or Ex-Partner:   . Emotionally Abused:   Marland Kitchen Physically Abused:   . Sexually Abused:      PHYSICAL EXAM:  VS: BP (!) 124/86   Pulse 75   Ht 5\' 9"  (1.753 m)   Wt (!) 300 lb (136.1 kg)   BMI 44.30 kg/m  Physical Exam Gen: NAD, alert, cooperative with exam, well-appearing   ASSESSMENT & PLAN:   Plantar fasciitis of right foot Has had improvement with injection and compression.  Able to run with little to no pain. -Counseled on home exercise therapy and supportive care. -If pain is  ongoing in the few months will consider MRI as she is training for half marathon.

## 2020-05-24 ENCOUNTER — Other Ambulatory Visit: Payer: Self-pay | Admitting: Obstetrics & Gynecology

## 2020-05-24 ENCOUNTER — Ambulatory Visit
Admission: RE | Admit: 2020-05-24 | Discharge: 2020-05-24 | Disposition: A | Discharge status: Home / Self Care | Payer: PRIVATE HEALTH INSURANCE | Source: Ambulatory Visit | Attending: Obstetrics & Gynecology | Admitting: Obstetrics & Gynecology

## 2020-05-24 ENCOUNTER — Other Ambulatory Visit: Payer: Self-pay

## 2020-05-24 DIAGNOSIS — M79661 Pain in right lower leg: Secondary | ICD-10-CM

## 2020-05-24 DIAGNOSIS — M79662 Pain in left lower leg: Secondary | ICD-10-CM

## 2020-09-15 IMAGING — US US PELVIS COMPLETE WITH TRANSVAGINAL
1 series · 14 of 25 positions shown · non-contrast
Comparison: 11/27/2016

CLINICAL DATA: Infrequent menses

EXAM:
TRANSABDOMINAL AND TRANSVAGINAL ULTRASOUND OF PELVIS
TECHNIQUE: Both transabdominal and transvaginal ultrasound examinations of the
pelvis were performed. Transabdominal technique was performed for
global imaging of the pelvis including uterus, ovaries, adnexal
regions, and pelvic cul-de-sac. It was necessary to proceed with
endovaginal exam following the transabdominal exam to visualize the
uterus endometrium ovaries.

[Series 1: us pelvis complete with transvaginal · 0.34mm/px · 14 of 70 slices shown]
[im 1/70]
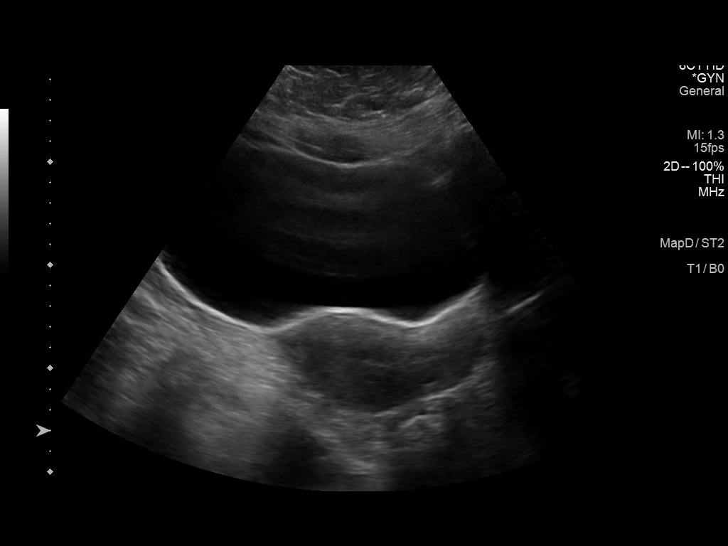
[im 6/70]
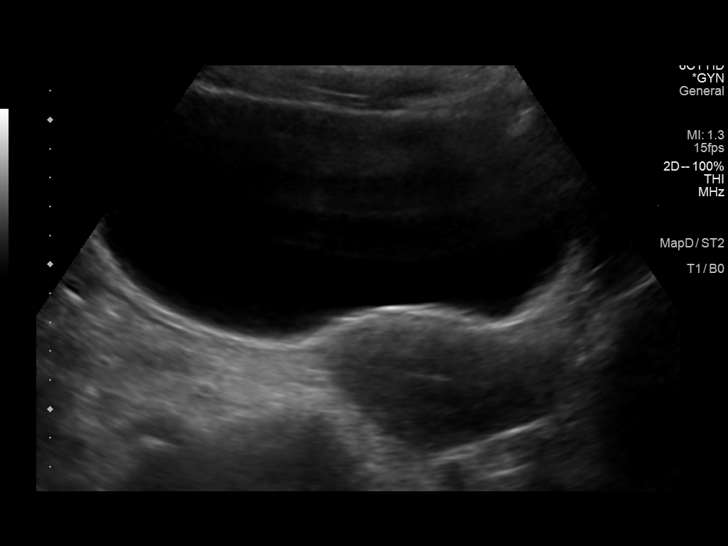
[im 12/70]
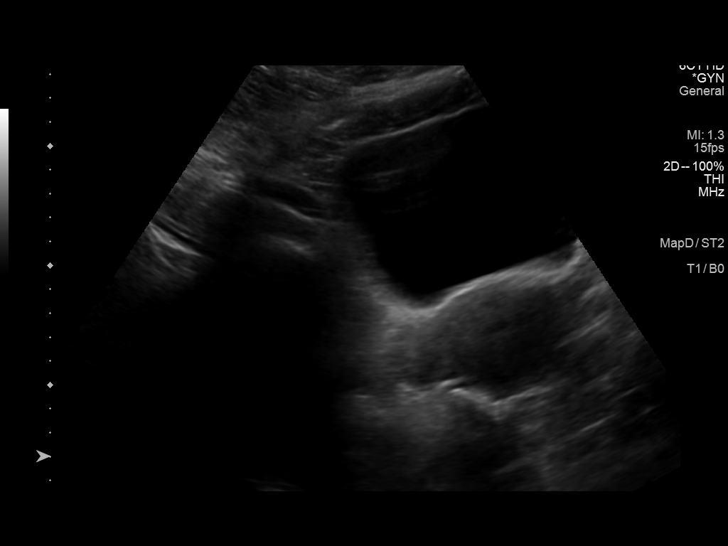
[im 18/70]
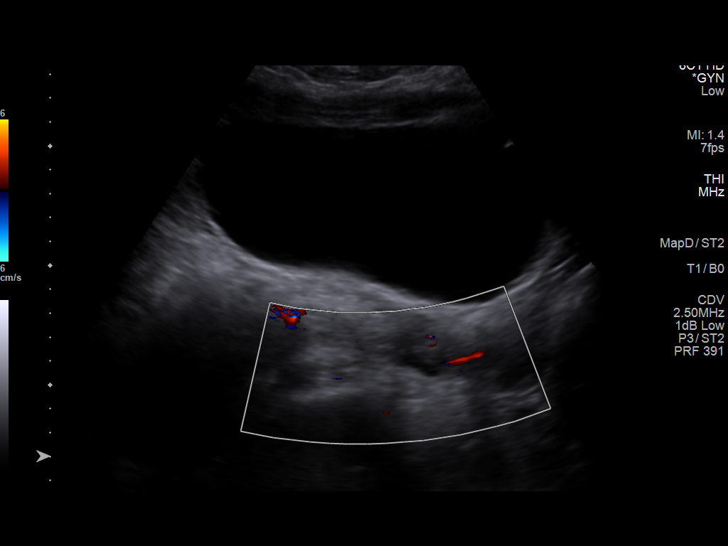
[im 24/70]
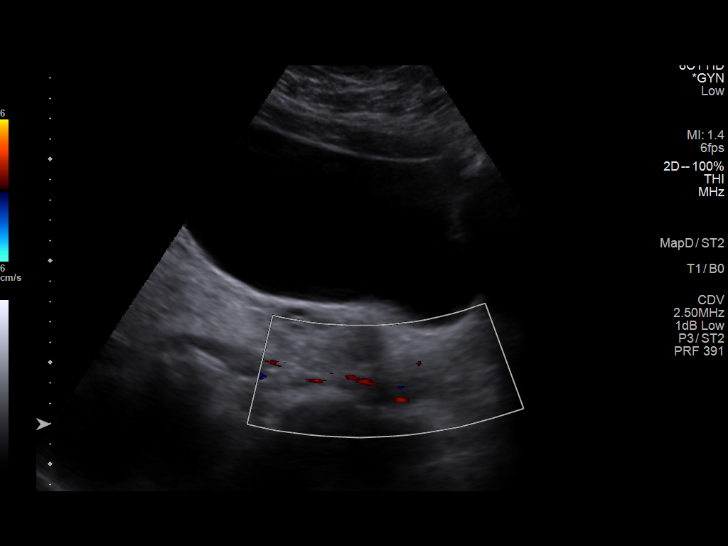
[im 26/70]
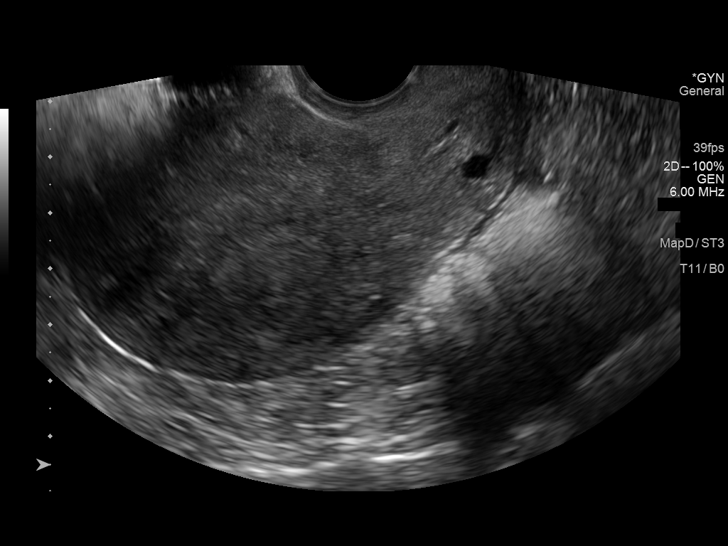
[im 32/70]
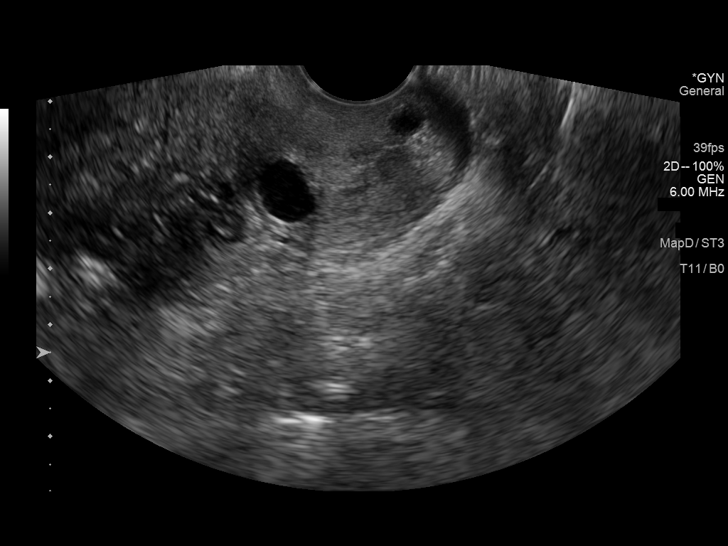
[im 38/70]
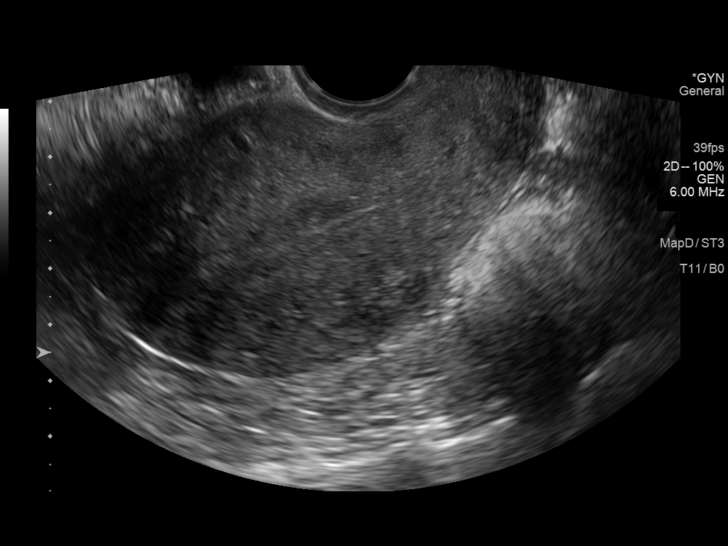
[im 44/70]
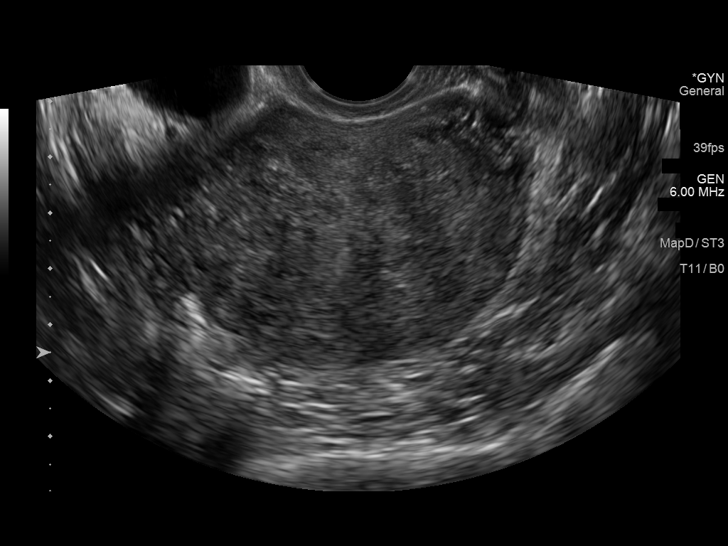
[im 47/70]
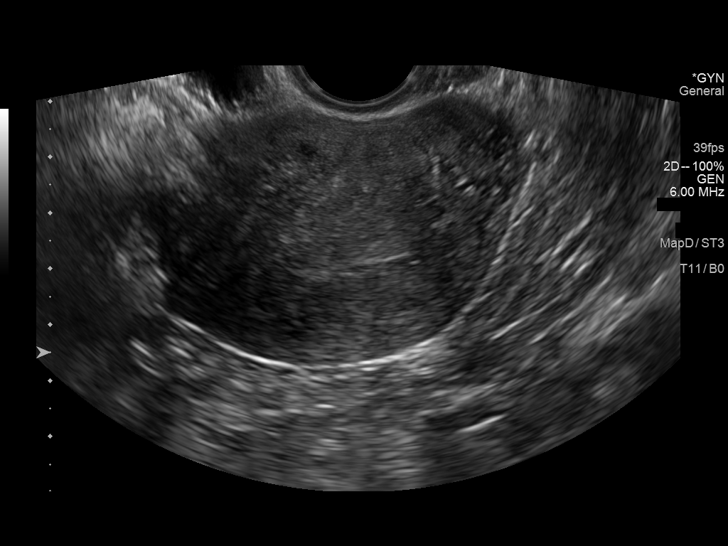
[im 52/70]
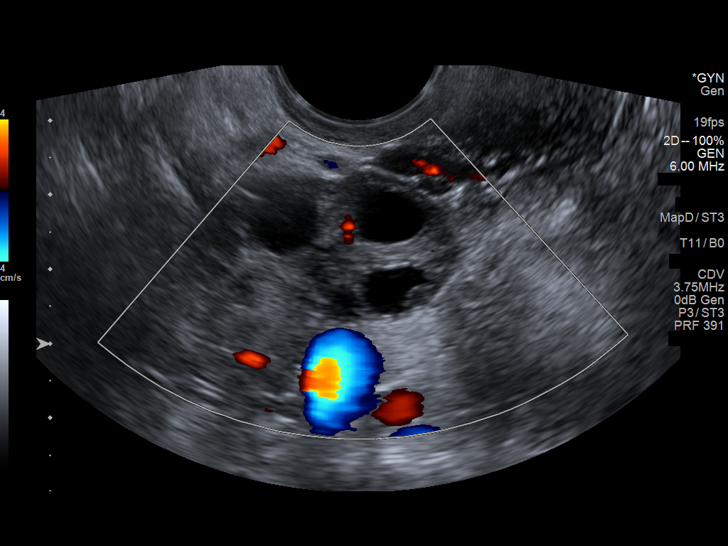
[im 58/70]
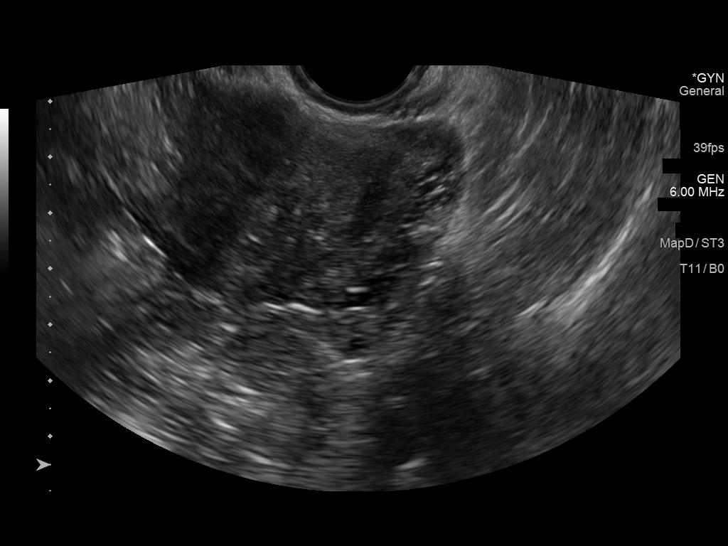
[im 64/70]
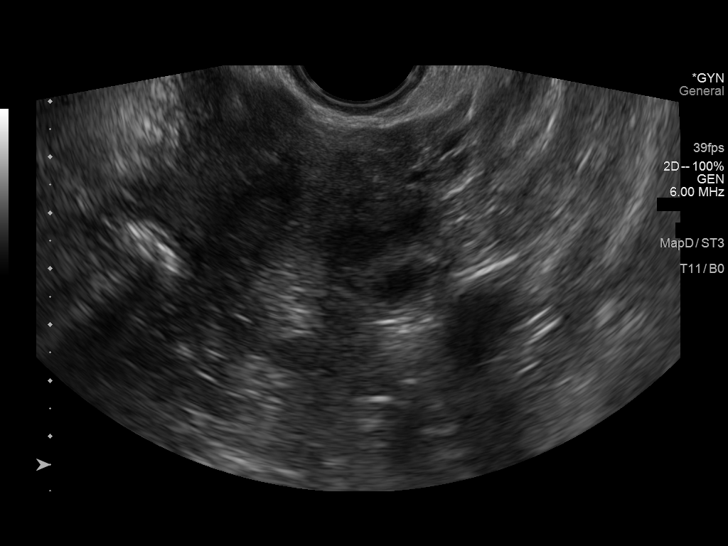
[im 70/70]
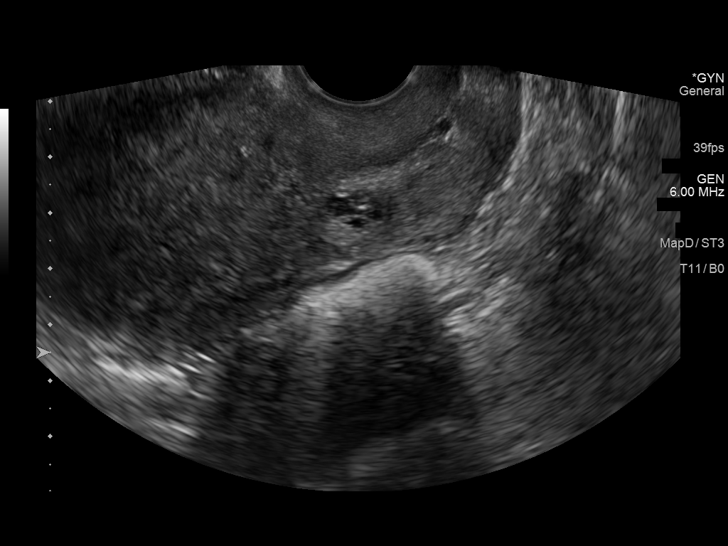

[14 of 25 positions shown; findings below may reference images not displayed]

FINDINGS: Uterus

Measurements: 9.8 x 4.5 x 7.1 cm = volume: 162 mL. No fibroids or
other mass visualized.

Endometrium

Thickness: 4.5 mm.  No focal abnormality visualized.

Right ovary

Measurements: 3.1 x 2.3 x 2.3 cm = volume: 8.8 mL. Normal
appearance/no adnexal mass.

Left ovary

Measurements: 2.6 x 1.9 x 2.8 cm = volume: 7.9 mL. Normal
appearance/no adnexal mass.

Other findings

No abnormal free fluid.
IMPRESSION: 1. Normal premenopausal endometrial thickness
2. Heterogeneous myometrial echotexture without discrete mass.

## 2020-11-23 IMAGING — US US EXTREM LOW VENOUS
1 series · 14 of 24 positions shown · non-contrast
Comparison: None available

CLINICAL DATA: Bilateral calf pain.  History of DVT.

EXAM:
BILATERAL LOWER EXTREMITY VENOUS DOPPLER ULTRASOUND
TECHNIQUE: Gray-scale sonography with compression, as well as color and duplex
ultrasound, were performed to evaluate the deep venous system(s)
from the level of the common femoral vein through the popliteal and
proximal calf veins.

[Series 1: us extrem low venous · 0.07mm/px · 14 of 69 slices shown]
[im 1/69]
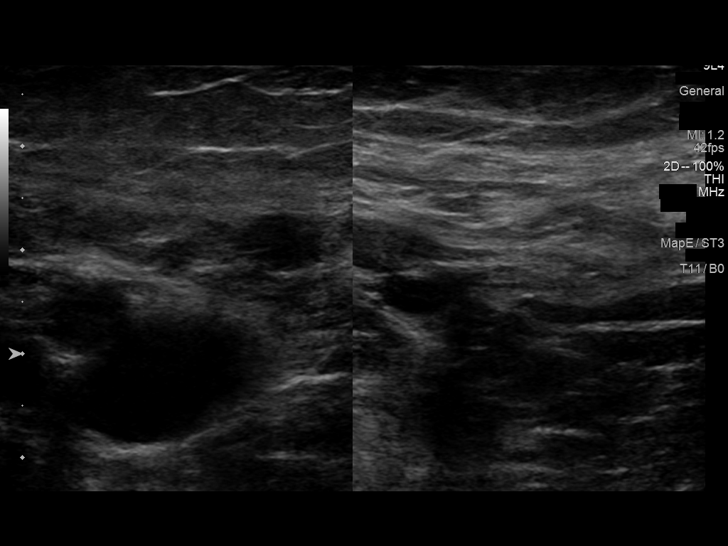
[im 6/69]
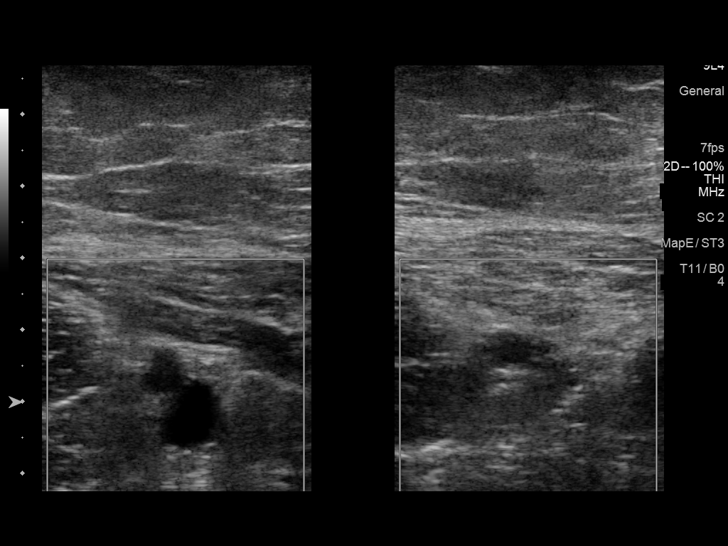
[im 12/69]
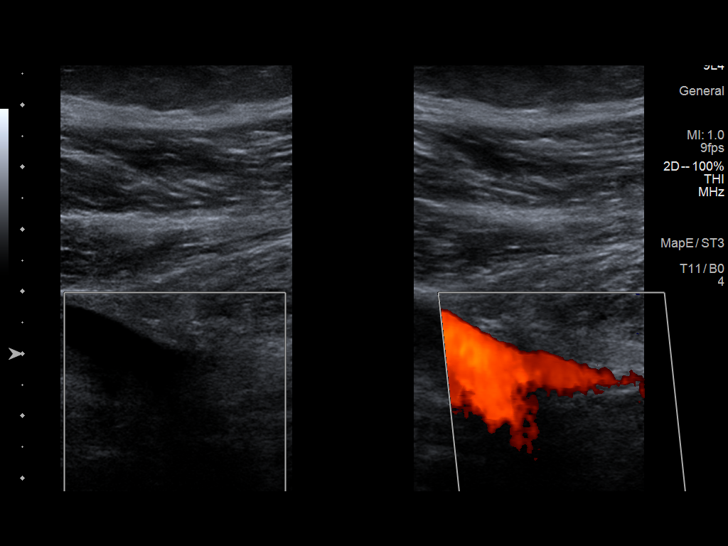
[im 18/69]
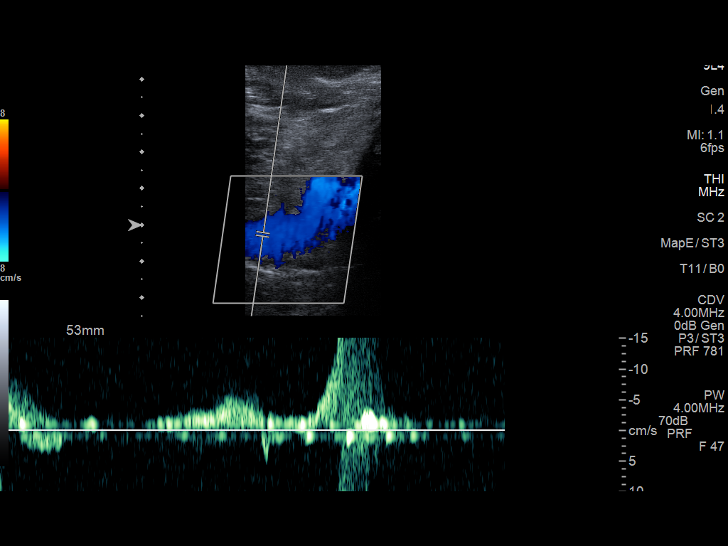
[im 21/69]
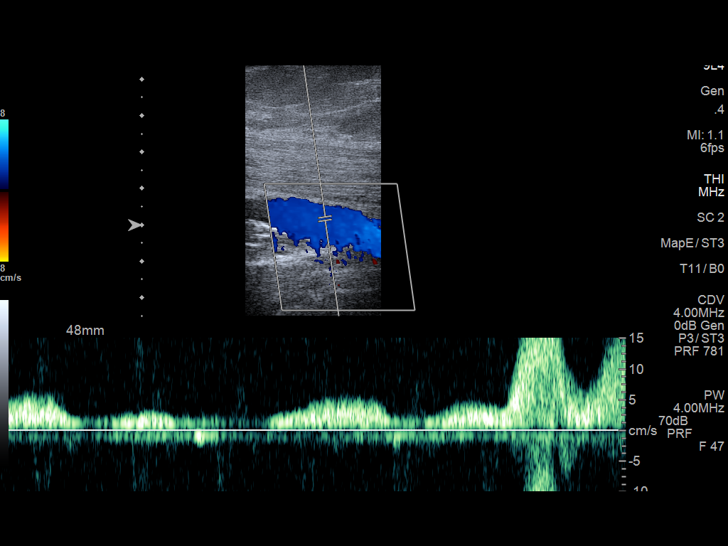
[im 27/69]
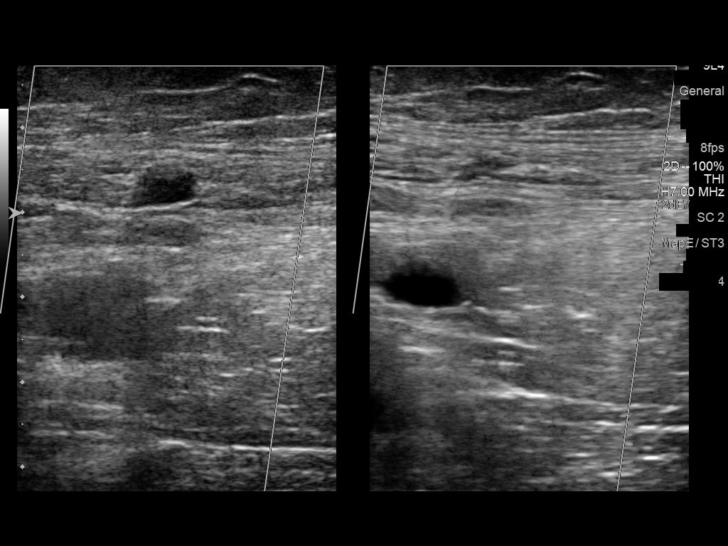
[im 33/69]
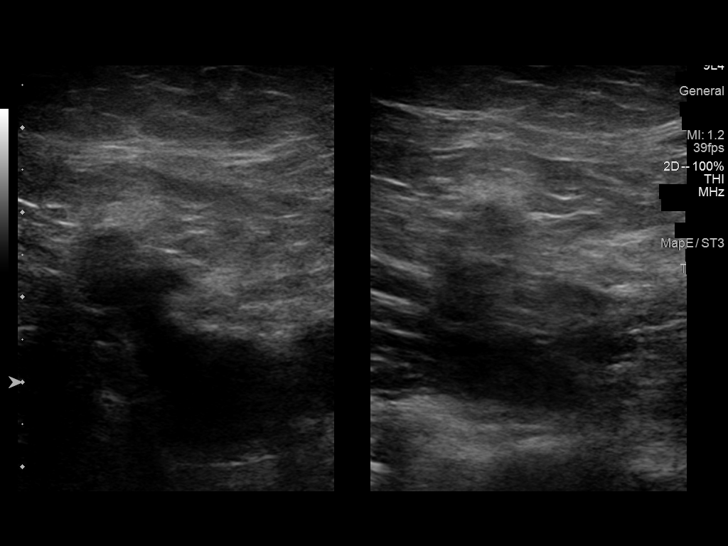
[im 36/69]
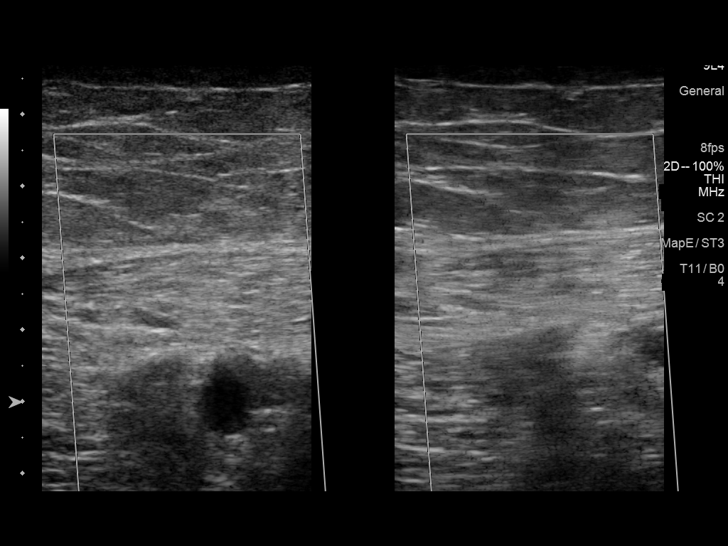
[im 42/69]
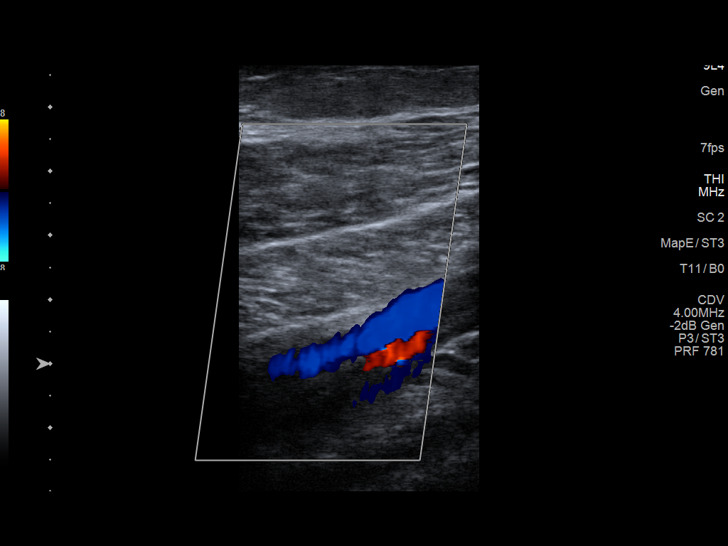
[im 48/69]
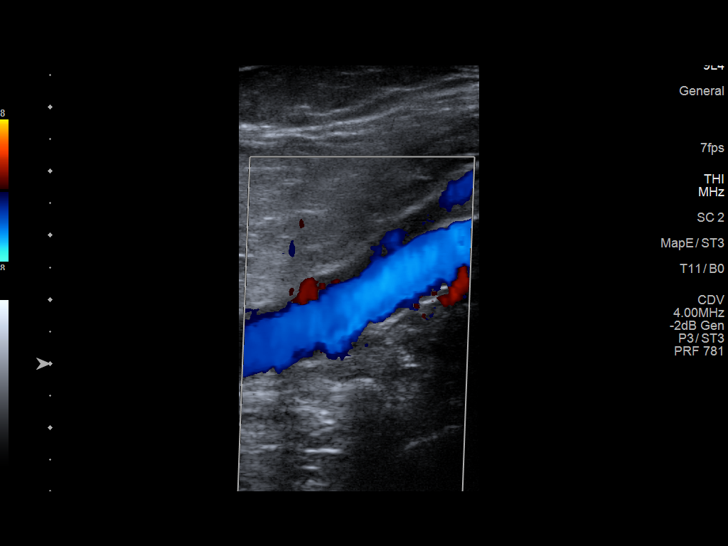
[im 54/69]
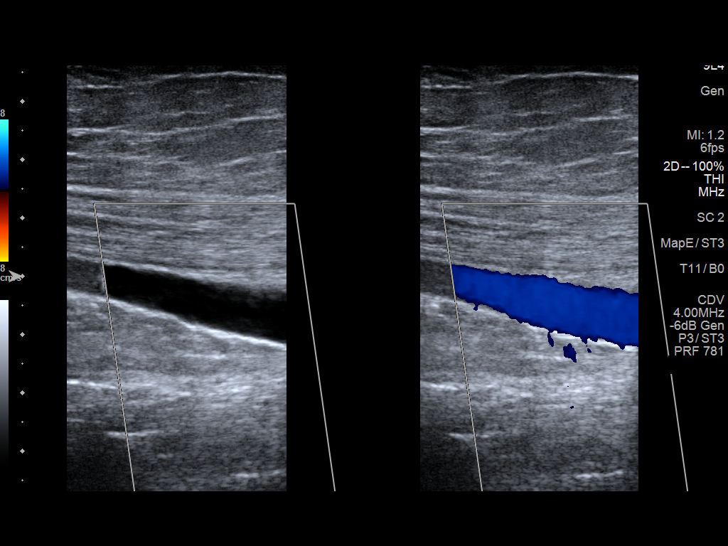
[im 57/69]
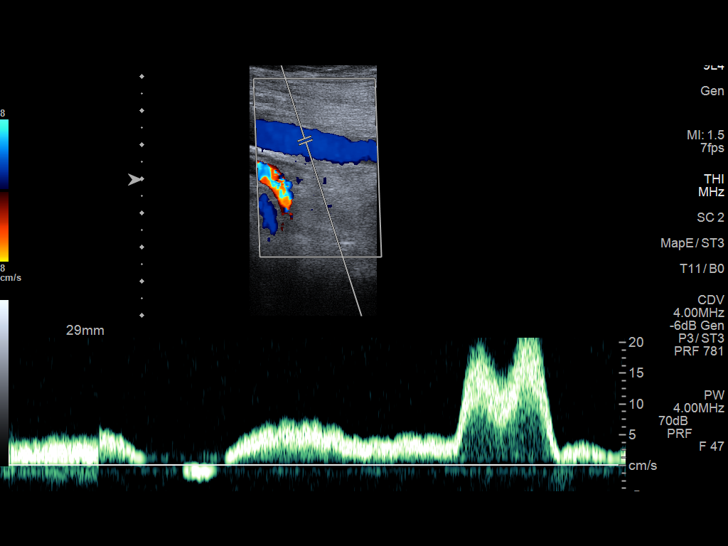
[im 63/69]
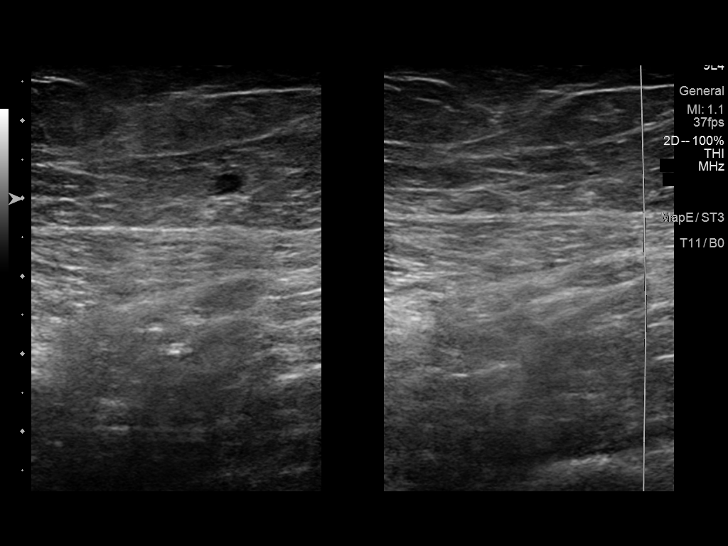
[im 69/69]
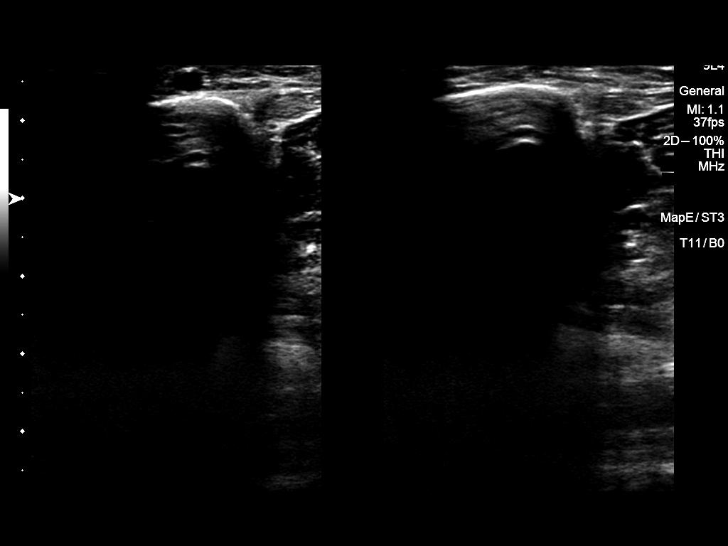

[14 of 24 positions shown; findings below may reference images not displayed]

FINDINGS: VENOUS

Normal compressibility of the common femoral, superficial femoral,
and popliteal veins, as well as the visualized calf veins.
Visualized portions of profunda femoral vein and great saphenous
vein unremarkable. No filling defects to suggest DVT on grayscale or
color Doppler imaging. Doppler waveforms show normal direction of
venous flow, normal respiratory phasicity and response to
augmentation.

OTHER

None.

Limitations: none
IMPRESSION: No femoropopliteal DVT nor evidence of DVT within the visualized
calf veins.

If clinical symptoms are inconsistent or if there are persistent or
worsening symptoms, further imaging (possibly involving the iliac
veins) may be warranted.

## 2021-02-26 ENCOUNTER — Encounter: Payer: Self-pay | Admitting: Family Medicine

## 2021-02-26 ENCOUNTER — Ambulatory Visit (INDEPENDENT_AMBULATORY_CARE_PROVIDER_SITE_OTHER): Payer: PRIVATE HEALTH INSURANCE | Admitting: Family Medicine

## 2021-02-26 ENCOUNTER — Ambulatory Visit: Payer: Self-pay

## 2021-02-26 ENCOUNTER — Other Ambulatory Visit: Payer: Self-pay

## 2021-02-26 VITALS — BP 120/82 | Ht 69.0 in | Wt 294.0 lb

## 2021-02-26 DIAGNOSIS — M222X1 Patellofemoral disorders, right knee: Secondary | ICD-10-CM

## 2021-02-26 DIAGNOSIS — M222X2 Patellofemoral disorders, left knee: Secondary | ICD-10-CM | POA: Insufficient documentation

## 2021-02-26 DIAGNOSIS — I872 Venous insufficiency (chronic) (peripheral): Secondary | ICD-10-CM | POA: Insufficient documentation

## 2021-02-26 DIAGNOSIS — M25562 Pain in left knee: Secondary | ICD-10-CM

## 2021-02-26 NOTE — Assessment & Plan Note (Signed)
Swelling is more likely related to the amount ibuprofen that she was taking. -Counseled on ibuprofen and compression

## 2021-02-26 NOTE — Assessment & Plan Note (Signed)
Her exam is reassuring and no structural changes appreciated.  Symptoms more associated with the technique of lunges and squats. -Counseled on home exercise therapy and supportive care. -Provided Pennsaid samples. -Could consider physical therapy

## 2021-02-26 NOTE — Patient Instructions (Signed)
Good to see you Please try the rub on medicine  Please try ice as needed  Please try working on the technique   Please send me a message in MyChart with any questions or updates.  Please see [place] back as needed.   --Dr. Jordan Likes

## 2021-02-26 NOTE — Progress Notes (Signed)
Leslie Branch - 35 y.o. female MRN [id number]  Date of birth: 1985/12/19  SUBJECTIVE:  Including CC & ROS.  No chief complaint on file.   Leslie Branch is a 35 y.o. female that is presenting with acute left knee pain.  The pain is worse with squats.  The pain is occurring on the medial aspect of the patella.   Review of Systems See HPI   HISTORY: Past Medical, Surgical, Social, and Family History Reviewed & Updated per EMR.   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   Termination of pregnancy (fetus)    x 3 in clinics    Past Surgical History:  Procedure Laterality Date   COLONOSCOPY     DILATATION & CURRETTAGE/HYSTEROSCOPY WITH RESECTOCOPE N/A 07/28/2019   Procedure: DILATATION & CURETTAGE/HYSTEROSCOPY WITH RESECTOCOPE;  Surgeon: Essie Hart, MD;  Location: [facility];  Service: Gynecology;  Laterality: N/A;   DILATION AND EVACUATION N/A 07/22/2017   Procedure: DILATATION AND EVACUATION WITH GENETICS;  Surgeon: Ranae Pila, MD;  Location: [facility];  Service: Gynecology;  Laterality: N/A;   HAND SURGERY Left    pinky - reconstructive   KNEE ARTHROSCOPY Left    TONSILLECTOMY AND ADENOIDECTOMY     as a child   tubes in ears     as a child   UPPER GI ENDOSCOPY      Family History  Problem Relation Age of Onset   Hypertension Mother    Hyperlipidemia Mother    Diabetes Father    Diabetes Sister     Social History   Socioeconomic History   Marital status: Married    Spouse name: Not on file   Number of children: Not on file   Years of education: Not on file   Highest education level: Not on file  Occupational History   Not on file  Tobacco Use   Smoking status: Former    Packs/day: 1.00     Years: 10.00    Pack years: 10.00    Types: Cigarettes    Quit date: 09/15/2010    Years since quitting: 10.4   Smokeless tobacco: Never  Vaping Use   Vaping Use: Never used  Substance and Sexual Activity   Alcohol use: Yes    Alcohol/week: 1.0 standard drink    Types: 1 Cans of beer per week    Comment: rare use   Drug use: No   Sexual activity: Yes    Birth control/protection: None    Comment: approx [redacted] wks gestation per patient   Other Topics Concern   Not on file  Social History Narrative   Not on file   Social Determinants of Health   Financial Resource Strain: Not on file  Food Insecurity: Not on file  Transportation Needs: Not on file  Physical Activity: Not on file  Stress: Not on file  Social Connections: Not on file  Intimate Partner Violence: Not on file     PHYSICAL EXAM:  VS: BP 120/82 (BP Location: Left Arm, Patient Position: Sitting, Cuff Size: Large)   Ht 5\' 9"  (1.753 m)   Wt 294 lb (133.4 kg)   BMI  43.42 kg/m  Physical Exam Gen: NAD, alert, cooperative with exam, well-appearing MSK:  Left knee: No effusion. Normal range of motion. Normal strength resistance. Neurovascular intact  Limited ultrasound: Left knee:  No effusion in the suprapatellar pouch. Normal-appearing quadricep and patellar tendon. Mild medial joint space narrowing. Normal-appearing lateral joint space. Trace Baker's cyst.  Summary: No significant structural changes.  Ultrasound and interpretation by Clare Gandy, MD    ASSESSMENT & PLAN:   Patellofemoral pain syndrome of both knees Her exam is reassuring and no structural changes appreciated.  Symptoms more associated with the technique of lunges and squats. -Counseled on home exercise therapy and supportive care. -Provided Pennsaid samples. -Could consider physical therapy  Venous insufficiency of both lower extremities Swelling is more likely related to the amount ibuprofen that she was taking. -Counseled on  ibuprofen and compression

## 2021-02-26 NOTE — Progress Notes (Signed)
Medication Samples have been provided to the patient.  Drug name: Pennsaid       Strength: 2%        Qty: 2 boxes  LOT: [id number]  Exp.Date: 03/2022  Dosing instructions: use a pea sized amount on affected area twice daily  The patient has been instructed regarding the correct time, dose, and frequency of taking this medication, including desired effects and most common side effects.   Lanier Prude 9:57 AM 02/26/2021

## 2021-03-13 ENCOUNTER — Encounter: Payer: Self-pay | Admitting: Family Medicine

## 2021-03-14 ENCOUNTER — Encounter: Payer: Self-pay | Admitting: Hematology

## 2021-03-15 ENCOUNTER — Ambulatory Visit (INDEPENDENT_AMBULATORY_CARE_PROVIDER_SITE_OTHER): Payer: No Typology Code available for payment source | Admitting: Family Medicine

## 2021-03-15 ENCOUNTER — Encounter: Payer: Self-pay | Admitting: Family Medicine

## 2021-03-15 ENCOUNTER — Other Ambulatory Visit: Payer: Self-pay

## 2021-03-15 ENCOUNTER — Ambulatory Visit: Payer: Self-pay

## 2021-03-15 VITALS — BP 152/80 | Ht 69.0 in | Wt 288.0 lb

## 2021-03-15 DIAGNOSIS — M25572 Pain in left ankle and joints of left foot: Secondary | ICD-10-CM

## 2021-03-15 DIAGNOSIS — I872 Venous insufficiency (chronic) (peripheral): Secondary | ICD-10-CM

## 2021-03-15 MED ORDER — PREDNISONE 5 MG PO TABS: ORAL_TABLET | ORAL | 0 refills | Status: DC

## 2021-03-15 NOTE — Assessment & Plan Note (Signed)
Having redness and pain over the left ankle.  Seems more severe than compared to the right.  Less likely for an infection.  No new or different medications. -Counseled on home exercise therapy and supportive care. -CBC, CMP, ANA, sed rate and CRP -Prednisone. -May need consider vascular studies or further imaging.

## 2021-03-15 NOTE — Patient Instructions (Signed)
Good to see you Please try the prednisone  I'll call with the lab results.   Please send me a message in MyChart with any questions or updates.  We'll see what the results are and go from there.   --Dr. Jordan Likes

## 2021-03-15 NOTE — Progress Notes (Signed)
Leslie Branch - 35 y.o. female MRN [id number]  Date of birth: [birth year]  SUBJECTIVE:  Including CC & ROS.  No chief complaint on file.   Leslie Branch is a 35 y.o. female that is presenting with bilateral lower ankle swelling and pain.  She is having redness around the anterior medial aspect of the left ankle.  No bug bites.  No fevers.  No new or different medications.   Review of Systems See HPI   HISTORY: Past Medical, Surgical, Social, and Family History Reviewed & Updated per EMR.   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   Termination of pregnancy (fetus)    x 3 in clinics    Past Surgical History:  Procedure Laterality Date   COLONOSCOPY     DILATATION & CURRETTAGE/HYSTEROSCOPY WITH RESECTOCOPE N/A 07/28/2019   Procedure: DILATATION & CURETTAGE/HYSTEROSCOPY WITH RESECTOCOPE;  Surgeon: Essie Hart, MD;  Location: [facility];  Service: Gynecology;  Laterality: N/A;   DILATION AND EVACUATION N/A 07/22/2017   Procedure: DILATATION AND EVACUATION WITH GENETICS;  Surgeon: Ranae Pila, MD;  Location: [facility];  Service: Gynecology;  Laterality: N/A;   HAND SURGERY Left    pinky - reconstructive   KNEE ARTHROSCOPY Left    TONSILLECTOMY AND ADENOIDECTOMY     as a child   tubes in ears     as a child   UPPER GI ENDOSCOPY      Family History  Problem Relation Age of Onset   Hypertension Mother    Hyperlipidemia Mother    Diabetes Father    Diabetes Sister     Social History   Socioeconomic History   Marital status: Married    Spouse name: Not on file   Number of children: Not on file   Years of education: Not on file   Highest education level: Not on file  Occupational History   Not on file   Tobacco Use   Smoking status: Former    Packs/day: 1.00    Years: 10.00    Pack years: 10.00    Types: Cigarettes    Quit date: 09/15/2010    Years since quitting: 10.5   Smokeless tobacco: Never  Vaping Use   Vaping Use: Never used  Substance and Sexual Activity   Alcohol use: Yes    Alcohol/week: 1.0 standard drink    Types: 1 Cans of beer per week    Comment: rare use   Drug use: No   Sexual activity: Yes    Birth control/protection: None    Comment: approx [redacted] wks gestation per patient   Other Topics Concern   Not on file  Social History Narrative   Not on file   Social Determinants of Health   Financial Resource Strain: Not on file  Food Insecurity: Not on file  Transportation Needs: Not on file  Physical Activity: Not on file  Stress: Not on file  Social Connections: Not on file  Intimate Partner Violence: Not on file     PHYSICAL EXAM:  VS: BP (!) 152/80 (BP Location: Left Arm, Patient Position: Sitting, Cuff Size: Large)   Ht 5\' 9"  (1.753  m)   Wt 288 lb (130.6 kg)   BMI 42.53 kg/m  Physical Exam Gen: NAD, alert, cooperative with exam, well-appearing MSK:  Right and left ankle: Redness and tenderness over the anterior aspect of the lower tibia and medial malleolus.  No streaking. Normal range of motion. Neurovascularly intact  Limited ultrasound: Left ankle:  Soft tissue swelling appreciated superficial to the posterior tibialis.  Soft tissue swelling appreciated overlying the distal tibia. No ankle effusion.  Summary: Nonspecific soft tissue swelling around the ankle  Ultrasound and interpretation by Clare Gandy, MD    ASSESSMENT & PLAN:   Venous stasis dermatitis of left lower extremity Having redness and pain over the left ankle.  Seems more severe than compared to the right.  Less likely for an infection.  No new or different medications. -Counseled on home exercise therapy and supportive care. -CBC, CMP, ANA, sed rate and  CRP -Prednisone. -May need consider vascular studies or further imaging.

## 2021-03-20 ENCOUNTER — Telehealth: Payer: Self-pay | Admitting: Family Medicine

## 2021-03-20 NOTE — Telephone Encounter (Signed)
Informed of results.   Myra Rude, MD [facility] 03/20/2021, 2:59 PM

## 2021-03-27 LAB — CBC WITH DIFFERENTIAL
Basophils Absolute: 0.1 10*3/uL (ref 0.0–0.2)
Basos: 1 %
EOS (ABSOLUTE): 0.4 10*3/uL (ref 0.0–0.4)
Eos: 4 %
Hematocrit: 39.7 % (ref 34.0–46.6)
Hemoglobin: 12.8 g/dL (ref 11.1–15.9)
Immature Grans (Abs): 0 10*3/uL (ref 0.0–0.1)
Immature Granulocytes: 0 %
Lymphocytes Absolute: 2.4 10*3/uL (ref 0.7–3.1)
Lymphs: 24 %
MCH: 26.8 pg (ref 26.6–33.0)
MCHC: 32.2 g/dL (ref 31.5–35.7)
MCV: 83 fL (ref 79–97)
Monocytes Absolute: 0.7 10*3/uL (ref 0.1–0.9)
Monocytes: 7 %
Neutrophils Absolute: 6.3 10*3/uL (ref 1.4–7.0)
Neutrophils: 64 %
RBC: 4.77 x10E6/uL (ref 3.77–5.28)
RDW: 13.1 % (ref 11.7–15.4)
WBC: 9.8 10*3/uL (ref 3.4–10.8)

## 2021-03-27 LAB — COMPREHENSIVE METABOLIC PANEL
ALT: 23 IU/L (ref 0–32)
AST: 19 IU/L (ref 0–40)
Albumin/Globulin Ratio: 1.7 (ref 1.2–2.2)
Albumin: 4.3 g/dL (ref 3.8–4.8)
Alkaline Phosphatase: 81 IU/L (ref 44–121)
BUN/Creatinine Ratio: 14 (ref 9–23)
BUN: 11 mg/dL (ref 6–20)
Bilirubin Total: 0.4 mg/dL (ref 0.0–1.2)
CO2: 21 mmol/L (ref 20–29)
Calcium: 9.1 mg/dL (ref 8.7–10.2)
Chloride: 105 mmol/L (ref 96–106)
Creatinine, Ser: 0.76 mg/dL (ref 0.57–1.00)
Globulin, Total: 2.6 g/dL (ref 1.5–4.5)
Glucose: 102 mg/dL — ABNORMAL HIGH (ref 65–99)
Potassium: 4.2 mmol/L (ref 3.5–5.2)
Sodium: 141 mmol/L (ref 134–144)
Total Protein: 6.9 g/dL (ref 6.0–8.5)
eGFR: 104 mL/min/{1.73_m2} (ref >59)

## 2021-03-27 LAB — C-REACTIVE PROTEIN: CRP: 10 mg/L (ref 0–10)

## 2021-03-27 LAB — SEDIMENTATION RATE: Sed Rate: 13 mm/hr (ref 0–32)

## 2021-03-27 LAB — ANA,IFA RA DIAG PNL W/RFLX TIT/PATN
ANA Titer 1: NEGATIVE
Cyclic Citrullin Peptide Ab: 8 units (ref 0–19)
Rheumatoid fact SerPl-aCnc: <10 IU/mL (ref <14.0)

## 2021-04-10 ENCOUNTER — Encounter: Payer: Self-pay | Admitting: Hematology

## 2021-04-17 ENCOUNTER — Encounter: Payer: Self-pay | Admitting: Nurse Practitioner

## 2021-04-17 ENCOUNTER — Other Ambulatory Visit: Payer: Self-pay

## 2021-04-17 ENCOUNTER — Ambulatory Visit (INDEPENDENT_AMBULATORY_CARE_PROVIDER_SITE_OTHER): Payer: No Typology Code available for payment source | Admitting: Nurse Practitioner

## 2021-04-17 VITALS — BP 128/82 | HR 81 | Temp 98.1°F | Ht 68.2 in | Wt 288.8 lb

## 2021-04-17 DIAGNOSIS — Z7689 Persons encountering health services in other specified circumstances: Secondary | ICD-10-CM | POA: Diagnosis not present

## 2021-04-17 DIAGNOSIS — R600 Localized edema: Secondary | ICD-10-CM | POA: Diagnosis not present

## 2021-04-17 DIAGNOSIS — R7309 Other abnormal glucose: Secondary | ICD-10-CM | POA: Diagnosis not present

## 2021-04-17 DIAGNOSIS — Z1159 Encounter for screening for other viral diseases: Secondary | ICD-10-CM

## 2021-04-17 DIAGNOSIS — L03116 Cellulitis of left lower limb: Secondary | ICD-10-CM | POA: Diagnosis not present

## 2021-04-17 DIAGNOSIS — Z6841 Body Mass Index (BMI) 40.0 and over, adult: Secondary | ICD-10-CM

## 2021-04-17 DIAGNOSIS — M25572 Pain in left ankle and joints of left foot: Secondary | ICD-10-CM | POA: Diagnosis not present

## 2021-04-17 MED ORDER — HYDROCHLOROTHIAZIDE 12.5 MG PO TABS: 12.5000 mg | ORAL_TABLET | Freq: Every day | ORAL | 1 refills | Status: DC

## 2021-04-17 MED ORDER — CEFTRIAXONE SODIUM 1 G IJ SOLR
1.0000 g | Freq: Once | INTRAMUSCULAR | Status: AC
  Administered 2021-04-17: 1 g via INTRAMUSCULAR

## 2021-04-17 NOTE — Progress Notes (Signed)
I,Tianna Badgett,acting as a Neurosurgeon for [employer], FNP.,have documented all relevant documentation on the behalf of Arnette Felts, FNP,as directed by  Arnette Felts, FNP while in the presence of Arnette Felts, FNP.  This visit occurred during the SARS-CoV-2 public health emergency.  Safety protocols were in place, including screening questions prior to the visit, additional usage of staff PPE, and extensive cleaning of exam room while observing appropriate contact time as indicated for disinfecting solutions.  Subjective:     Patient ID: Leslie Branch , female    DOB: [birth year] , 35 y.o.   MRN: [id number]   Chief Complaint  Patient presents with   Establish Care    HPI  Patient is here to establish care was seeing Dr. Maryelizabeth Rowan - she is no longer affiliated with [facility] and per patient she is not seeing patients in person on a limited basis.  Works for [employer]. She is married with a 40 year old son. She is originally from [place], relocated with her in-laws.   She has concerns with ankle pain since June - had redness and swelling and was seen during a virtual visit. She was offered a diuretic but did not take. She is also having pain to touch to left ankle. She did take prednisone from Dr. Noreene Filbert which helped but the redness was still present. She was given cephalexin 3 times a day was unable to get the 4th dose. She took her last dose of antibiotics 3 days. She has worn support socks but did not see this improves.  She did go to sports medicine - did an ultrasound found bakers cyst to right knee.   She limits her intake of salt. On average will drink approximately 48-64 oz water a day.   She has prothrombin gene mutation - she had a blood clot to her right thumb. When she was pregnant she took injectable blood thinner and will see hematology as needed.   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 Take 1 tablet by mouth now repeat in 5 days, Disp: 2 tablet, Rfl: 0   Allergies  Allergen Reactions   Chloraseptic Sore Throat [Acetaminophen] Anaphylaxis    Closed throat when used spray     Review of Systems  Constitutional: Negative.   Respiratory: Negative.    Cardiovascular: Negative.   Skin:  Positive for color change (left lower extremityerythema and slightly warm to touch).  Neurological:  Negative.   Psychiatric/Behavioral: Negative.      Today's Vitals   04/17/21 1204  BP: 128/82  Pulse: 81  Temp: 98.1 F (36.7 C)  TempSrc: Oral  Weight: 288 lb 12.8 oz (131 kg)  Height: 5' 8.2" (1.732 m)   Body mass index is 43.65 kg/m.  Wt Readings from Last 3 Encounters:  04/17/21 288 lb 12.8 oz (131 kg)  03/15/21 288 lb (130.6 kg)  02/26/21 294 lb (133.4 kg)    Objective:  Physical Exam Vitals reviewed.  Constitutional:      Appearance: Normal appearance.  Cardiovascular:     Pulses: Normal pulses.     Heart sounds: Normal heart sounds. No murmur heard. Pulmonary:     Effort: Pulmonary effort is normal. No respiratory distress.     Breath sounds: Normal breath sounds. No wheezing.  Musculoskeletal:        General:  Tenderness (left ankle) present. No swelling.  Neurological:     General: No focal deficit present.     Mental Status: She is alert and oriented to person, place, and time.     Cranial Nerves: No cranial nerve deficit.     Motor: No weakness.  Psychiatric:        Mood and Affect: Mood normal.        Behavior: Behavior normal.        Thought Content: Thought content normal.        Judgment: Judgment normal.        Assessment And Plan:     1. Acute left ankle pain Comments: thought to be related to cellulitis will treat Venous doppler negative for DVT - Uric acid  2. Abnormal glucose - Hemoglobin A1c  3. Localized edema Comments: encouraged to wear compression socks  - hydrochlorothiazide (HYDRODIURIL) 12.5 MG tablet; Take 1 tablet (12.5 mg total) by mouth daily.  Dispense: 30 tablet; Refill: 1  4. Cellulitis of left lower extremity Comments: Rocephin given in office - EKG 12-Lead - cefTRIAXone (ROCEPHIN) injection 1 g  5. Class 3 severe obesity due to excess calories without serious comorbidity with body mass index (BMI) of 40.0 to 44.9 in adult [facility])  She is encouraged to strive for BMI less than 30 to decrease cardiac risk. Advised to aim for at least 150 minutes of exercise per week.  6. Establishing care with new doctor, encounter for  7. Encounter for hepatitis C screening test for low risk patient Will check Hepatitis C screening due to recent recommendations to screen all adults 18 years and older - Hepatitis C antibody     Patient was given opportunity to ask questions. Patient verbalized understanding of the plan and was able to repeat key elements of the plan. All questions were answered to their satisfaction.  Arnette Felts, FNP   I, Arnette Felts, FNP, have reviewed all documentation for this visit. The documentation on 04/30/21 for the exam, diagnosis, procedures, and orders are all accurate and complete.   IF YOU HAVE BEEN REFERRED TO A SPECIALIST, IT MAY  TAKE 1-2 WEEKS TO SCHEDULE/PROCESS THE REFERRAL. IF YOU HAVE NOT HEARD FROM US/SPECIALIST IN TWO WEEKS, PLEASE GIVE [place] A CALL AT [phone].   THE PATIENT IS ENCOURAGED TO PRACTICE SOCIAL DISTANCING DUE TO THE COVID-19 PANDEMIC.

## 2021-04-17 NOTE — Patient Instructions (Signed)
Ankle Pain The ankle joint holds your body weight and allows you to move around. Ankle pain can occur on either side or the back of one ankle or both ankles. Ankle pain may be sharp and burning or dull and aching. There may be tenderness, stiffness, redness, or warmth around the ankle. Many things can cause anklepain, including an injury to the area and overuse of the ankle. Follow these instructions at home: Activity Rest your ankle as told by your health care provider. Avoid any activities that cause ankle pain. Do not use the injured limb to support your body weight until your health care provider says that you can. Use crutches as told by your health care provider. Do exercises as told by your health care provider. Ask your health care provider when it is safe to drive if you have a brace on your ankle. If you have a brace: Wear the brace as told by your health care provider. Remove it only as told by your health care provider. Loosen the brace if your toes tingle, become numb, or turn cold and blue. Keep the brace clean. If the brace is not waterproof: Do not let it get wet. Cover it with a watertight covering when you take a bath or shower. If you were given an elastic bandage:  Remove it when you take a bath or a shower. Try not to move your ankle very much, but wiggle your toes from time to time. This helps to prevent swelling. Adjust the bandage to make it more comfortable if it feels too tight. Loosen the bandage if you have numbness or tingling in your foot or if your foot turns cold and blue.  Managing pain, stiffness, and swelling  If directed, put ice on the painful area. If you have a removable brace or elastic bandage, remove it as told by your health care provider. Put ice in a plastic bag. Place a towel between your skin and the bag. Leave the ice on for 20 minutes, 2-3 times a day. Move your toes often to avoid stiffness and to lessen swelling. Raise (elevate) your  ankle above the level of your heart while you are sitting or lying down.  General instructions Record information about your pain. Writing down the following may be helpful for you and your health care provider: How often you have ankle pain. Where the pain is located. What the pain feels like. If treatment involves wearing a prescribed shoe or insole, make sure you wear it correctly and for as long as told by your health care provider. Take over-the-counter and prescription medicines only as told by your health care provider. Keep all follow-up visits as told by your health care provider. This is important. Contact a health care provider if: Your pain gets worse. Your pain is not relieved with medicines. You have a fever or chills. You are having more trouble with walking. You have new symptoms. Get help right away if: Your foot, leg, toes, or ankle: Tingles or becomes numb. Becomes swollen. Turns pale or blue. Summary Ankle pain can occur on either side or the back of one ankle or both ankles. Ankle pain may be sharp and burning or dull and aching. Rest your ankle as told by your health care provider. If told, apply ice to the area. Take over-the-counter and prescription medicines only as told by your health care provider. This information is not intended to replace advice given to you by your health care provider. Make sure you   discuss any questions you have with your healthcare provider. Document Revised: 12/21/2018 Document Reviewed: 03/10/2018 Elsevier Patient Education  2022 [employer]  

## 2021-04-18 ENCOUNTER — Encounter: Payer: Self-pay | Admitting: Nurse Practitioner

## 2021-04-18 LAB — URIC ACID: Uric Acid: 5.3 mg/dL (ref 2.6–6.2)

## 2021-04-18 LAB — HEMOGLOBIN A1C
Est. average glucose Bld gHb Est-mCnc: 120 mg/dL
Hgb A1c MFr Bld: 5.8 % — ABNORMAL HIGH (ref 4.8–5.6)

## 2021-04-18 LAB — HEPATITIS C ANTIBODY: Hep C Virus Ab: 0.1 s/co ratio (ref 0.0–0.9)

## 2021-04-23 ENCOUNTER — Other Ambulatory Visit: Payer: Self-pay | Admitting: Nurse Practitioner

## 2021-04-23 MED ORDER — FLUCONAZOLE 100 MG PO TABS: 100.0000 mg | ORAL_TABLET | Freq: Every day | ORAL | 0 refills | Status: DC

## 2021-04-23 NOTE — Progress Notes (Signed)
Yes continue the medication and check to see if she is wearing support socks

## 2021-04-30 ENCOUNTER — Other Ambulatory Visit: Payer: Self-pay | Admitting: Nurse Practitioner

## 2021-04-30 DIAGNOSIS — R600 Localized edema: Secondary | ICD-10-CM

## 2021-04-30 DIAGNOSIS — M25572 Pain in left ankle and joints of left foot: Secondary | ICD-10-CM

## 2021-05-08 ENCOUNTER — Other Ambulatory Visit: Payer: Self-pay

## 2021-05-08 ENCOUNTER — Other Ambulatory Visit: Payer: Self-pay | Admitting: *Deleted

## 2021-05-08 ENCOUNTER — Telehealth: Payer: Self-pay | Admitting: *Deleted

## 2021-05-08 DIAGNOSIS — I872 Venous insufficiency (chronic) (peripheral): Secondary | ICD-10-CM

## 2021-05-08 DIAGNOSIS — M7989 Other specified soft tissue disorders: Secondary | ICD-10-CM

## 2021-05-08 DIAGNOSIS — M79662 Pain in left lower leg: Secondary | ICD-10-CM

## 2021-05-08 NOTE — Telephone Encounter (Signed)
Left detailed telephone voice message to let Ms. Blue know that venous reflux study of her left leg has been added on tomorrow (05-09-2021) at 2:00 PM prior to her new patient consultation with Dr. Edilia Bo.  Dr. Edilia Bo requested the venous reflux study due to left leg pain, swelling, and skin discoloration.  Requested Ms. Fauth to check in with front desk at 1:45 PM tomorrow prior to venous reflux study.

## 2021-05-09 ENCOUNTER — Ambulatory Visit (INDEPENDENT_AMBULATORY_CARE_PROVIDER_SITE_OTHER): Payer: No Typology Code available for payment source | Admitting: Vascular Surgery

## 2021-05-09 ENCOUNTER — Other Ambulatory Visit: Payer: Self-pay

## 2021-05-09 ENCOUNTER — Ambulatory Visit (HOSPITAL_COMMUNITY)
Admission: RE | Admit: 2021-05-09 | Discharge: 2021-05-09 | Disposition: A | Discharge status: Home / Self Care | Payer: No Typology Code available for payment source | Source: Ambulatory Visit | Attending: Vascular Surgery | Admitting: Vascular Surgery

## 2021-05-09 ENCOUNTER — Ambulatory Visit: Payer: No Typology Code available for payment source | Admitting: Nurse Practitioner

## 2021-05-09 ENCOUNTER — Encounter: Payer: Self-pay | Admitting: Vascular Surgery

## 2021-05-09 VITALS — BP 127/78 | HR 66 | Temp 98.2°F | Resp 14 | Ht 69.0 in | Wt 286.0 lb

## 2021-05-09 DIAGNOSIS — M79662 Pain in left lower leg: Secondary | ICD-10-CM | POA: Diagnosis present

## 2021-05-09 DIAGNOSIS — I872 Venous insufficiency (chronic) (peripheral): Secondary | ICD-10-CM

## 2021-05-09 DIAGNOSIS — M7989 Other specified soft tissue disorders: Secondary | ICD-10-CM

## 2021-05-09 NOTE — Progress Notes (Signed)
ASSESSMENT & PLAN   CHRONIC VENOUS INSUFFICIENCY: Based on her duplex scan she does have evidence of mild chronic venous insufficiency on the left.  She has superficial venous reflux in the left great saphenous vein of the thigh.  She has no significant deep venous reflux no evidence of DVT.  We have discussed the importance of intermittent leg elevation and the proper positioning for this.  I recommended a knee-high compression stocking with a gradient of 15 to 20 mmHg.  I have encouraged her to continue to exercise.  We also discussed water aerobics which I think is very helpful for patients with venous disease.  I have encouraged her to avoid prolonged sitting and standing.  We also discussed the importance of maintaining a healthy weight as central obesity especially increases lower extremity venous pressure.  I think the swelling should resolve with these measures and the cellulitis should resolve quicker when she improves her interstitial fluid circulation.  If she develops worsening symptoms in the future I be happy to see her back at any time.  REASON FOR CONSULT:    Left lower extremity swelling.  The consult is requested by Arnette Felts, FNP  HPI:   Leslie Branch is a 35 y.o. female who was referred with left leg swelling.  I did review the records from the referring office.  The patient was seen on 04/17/2021 with concerns of left ankle pain and swelling since June.  Apparently she was taking prednisone which helped the redness some.  Been on Keflex.  She had a venous duplex scan at that time which showed no evidence of DVT.  On my history, the patient denies any pain in her legs with standing or sitting.  The swelling is something that is fairly new.  She is very active and runs half marathons.  She has had no previous history of DVT.  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   Termination of pregnancy (fetus)    x 3 in clinics    Family History  Problem Relation Age of Onset   Hypertension Mother    Hyperlipidemia Mother    Diabetes Father    Prostate cancer Father    Diabetes Sister     SOCIAL HISTORY: Social History   Tobacco Use   Smoking status: Former    Packs/day: 1.00    Years: 10.00    Pack years: 10.00    Types: Cigarettes    Quit date: 09/15/2010    Years since quitting: 10.6   Smokeless tobacco: Never  Substance Use Topics   Alcohol use: Yes    Alcohol/week: 1.0 standard drink    Types: 1 Cans of beer per week    Comment: occassional    Allergies  Allergen Reactions   Chloraseptic Sore Throat [Acetaminophen] Anaphylaxis    Closed throat when used spray   Other     Current Outpatient Medications  Medication Sig Dispense Refill   hydrochlorothiazide (HYDRODIURIL) 12.5 MG tablet Take 1 tablet (12.5 mg total) by mouth daily. 30 tablet 1   fluconazole (DIFLUCAN) 100 MG tablet Take 1 tablet (100 mg total) by mouth daily. Take 1 tablet by mouth  now repeat in 5 days 2 tablet 0   No current facility-administered medications for this visit.    REVIEW OF SYSTEMS:  [X]  denotes positive finding, [ ]  denotes negative finding Cardiac  Comments:  Chest pain or chest pressure:    Shortness of breath upon exertion:    Short of breath when lying flat:    Irregular heart rhythm:        Vascular    Pain in calf, thigh, or hip brought on by ambulation: x   Pain in feet at night that wakes you up from your sleep:     Blood clot in your veins:    Leg swelling:  x       Pulmonary    Oxygen at home:    Productive cough:     Wheezing:         Neurologic    Sudden weakness in arms or legs:     Sudden numbness in arms or legs:     Sudden onset of difficulty speaking or slurred speech:    Temporary loss of vision in one  eye:     Problems with dizziness:         Gastrointestinal    Blood in stool:     Vomited blood:         Genitourinary    Burning when urinating:     Blood in urine:        Psychiatric    Major depression:         Hematologic    Bleeding problems:    Problems with blood clotting too easily:        Skin    Rashes or ulcers:        Constitutional    Fever or chills:    -  PHYSICAL EXAM:   Vitals:   05/09/21 1410  BP: 127/78  Pulse: 66  Resp: 14  Temp: 98.2 F (36.8 C)  TempSrc: Temporal  SpO2: 98%  Weight: 286 lb (129.7 kg)  Height: 5\' 9"  (1.753 m)   Body mass index is 42.23 kg/m.  GENERAL: The patient is a well-nourished female, in no acute distress. The vital signs are documented above. CARDIAC: There is a regular rate and rhythm.  VASCULAR: I do not detect carotid bruits. She has palpable pedal pulses. She has mild left lower extremity swelling mostly at the ankle with some mild resolving cellulitis.   PULMONARY: There is good air exchange bilaterally without wheezing or rales. ABDOMEN: Soft and non-tender with normal pitched bowel sounds.  MUSCULOSKELETAL: There are no major deformities. NEUROLOGIC: No focal weakness or paresthesias are detected. SKIN: There are no ulcers or rashes noted. PSYCHIATRIC: The patient has a normal affect.  DATA:    VENOUS DUPLEX: I have independently interpreted her venous duplex scan of the LEFT lower extremity today.  There is no evidence of DVT.  There is no deep venous reflux.  There is superficial venous reflux from the saphenofemoral junction to the proximal thigh.  The diameter enlarges up to 7.2 mm.     [facility]

## 2021-06-11 ENCOUNTER — Encounter: Payer: No Typology Code available for payment source | Admitting: Vascular Surgery

## 2021-06-11 ENCOUNTER — Encounter (HOSPITAL_COMMUNITY): Payer: No Typology Code available for payment source

## 2021-11-20 ENCOUNTER — Other Ambulatory Visit: Payer: Self-pay

## 2021-11-20 ENCOUNTER — Encounter (INDEPENDENT_AMBULATORY_CARE_PROVIDER_SITE_OTHER): Payer: Self-pay | Admitting: Bariatrics

## 2021-11-20 ENCOUNTER — Ambulatory Visit (INDEPENDENT_AMBULATORY_CARE_PROVIDER_SITE_OTHER): Payer: No Typology Code available for payment source | Admitting: Bariatrics

## 2021-11-20 VITALS — BP 130/82 | HR 70 | Temp 98.1°F | Ht 69.0 in | Wt 284.0 lb

## 2021-11-20 DIAGNOSIS — I872 Venous insufficiency (chronic) (peripheral): Secondary | ICD-10-CM | POA: Diagnosis not present

## 2021-11-20 DIAGNOSIS — Z1331 Encounter for screening for depression: Secondary | ICD-10-CM

## 2021-11-20 DIAGNOSIS — D500 Iron deficiency anemia secondary to blood loss (chronic): Secondary | ICD-10-CM

## 2021-11-20 DIAGNOSIS — R5383 Other fatigue: Secondary | ICD-10-CM | POA: Diagnosis not present

## 2021-11-20 DIAGNOSIS — E668 Other obesity: Secondary | ICD-10-CM

## 2021-11-20 DIAGNOSIS — R7303 Prediabetes: Secondary | ICD-10-CM

## 2021-11-20 DIAGNOSIS — R0602 Shortness of breath: Secondary | ICD-10-CM

## 2021-11-20 DIAGNOSIS — Z6841 Body Mass Index (BMI) 40.0 and over, adult: Secondary | ICD-10-CM

## 2021-11-20 DIAGNOSIS — E559 Vitamin D deficiency, unspecified: Secondary | ICD-10-CM

## 2021-11-20 DIAGNOSIS — K760 Fatty (change of) liver, not elsewhere classified: Secondary | ICD-10-CM

## 2021-11-21 ENCOUNTER — Encounter (INDEPENDENT_AMBULATORY_CARE_PROVIDER_SITE_OTHER): Payer: Self-pay | Admitting: Bariatrics

## 2021-11-21 LAB — T3: T3, Total: 142 ng/dL (ref 71–180)

## 2021-11-21 LAB — TSH: TSH: 1.39 u[IU]/mL (ref 0.450–4.500)

## 2021-11-21 LAB — INSULIN, RANDOM: INSULIN: 17.3 u[IU]/mL (ref 2.6–24.9)

## 2021-11-21 LAB — T4, FREE: Free T4: 0.99 ng/dL (ref 0.82–1.77)

## 2021-11-21 NOTE — Progress Notes (Signed)
Chief Complaint:   OBESITY Leslie Branch (MR# [id number]) is a 36 y.o. female who presents for evaluation and treatment of obesity and related comorbidities. Current BMI is Body mass index is 41.94 kg/m. Leslie Branch has been struggling with her weight for many years and has been unsuccessful in either losing weight, maintaining weight loss, or reaching her healthy weight goal.  Leslie Branch states that she likes to cook but notes work and other activities as an obstacle. She craves sweets. She snacks after dinner.  Leslie Branch is currently in the action stage of change and ready to dedicate time achieving and maintaining a healthier weight. Leslie Branch is interested in becoming our patient and working on intensive lifestyle modifications including (but not limited to) diet and exercise for weight loss.  Leslie Branch's habits were reviewed today and are as follows: Her family eats meals together, she thinks her family will eat healthier with her, her desired weight loss is 109 pounds, she has been heavy most of her life, her heaviest weight ever was 305 pounds, she is a picky eater and doesn't like to eat healthier foods, she has significant food cravings issues, she snacks frequently in the evenings, she is frequently drinking liquids with calories, she frequently makes poor food choices, she frequently eats larger portions than normal, and she struggles with emotional eating.  Depression Screen Leslie Branch's Food and Mood (modified PHQ-9) score was 5.  Depression screen PHQ 2/9 11/20/2021  Decreased Interest 1  Down, Depressed, Hopeless 1  PHQ - 2 Score 2  Altered sleeping 1  Tired, decreased energy 1  Change in appetite 1  Feeling bad or failure about yourself  0  Trouble concentrating 0  Moving slowly or fidgety/restless 0  Suicidal thoughts 0  PHQ-9 Score 5  Difficult doing work/chores Not difficult at all   Subjective:   1. Other fatigue Leslie Branch will continue activities. [name] denies daytime somnolence and denies  waking up still tired. Patient has a history of symptoms of nasal obstruction. Leslie Branch generally gets  6-8  hours of sleep per night, and states that she has generally restful sleep. Snoring is present. Apneic episodes is present. Epworth Sleepiness Score is 1.    2. SOB (shortness of breath) on exertion [name] will continue activities. [name] notes increasing shortness of breath with exercising and seems to be worsening over time with weight gain. She notes getting out of breath sooner with activity than she used to. This has not gotten worse recently. [name] denies shortness of breath at rest or orthopnea.   3. Venous insufficiency of both lower extremities Leslie Branch has not had any procedures.  4. Iron deficiency anemia due to chronic blood loss Leslie Branch is having bleeding issues.   5. Pre-diabetes Leslie Branch's last A1C was 5.8 on 04/17/2021.  6. Vitamin D deficiency Leslie Branch is not taking Vitamin D currently. She states that she cannot take Vitamin D due to it causing her to have a headache. Will get labs from her PCP.   7. Fatty liver Leslie Branch's fatty liver was found with blood test.   Assessment/Plan:   1. Other fatigue Leslie Branch will gradually increase activities and exercise. Leslie Branch does feel that her weight is causing her energy to be lower than it should be. Fatigue may be related to obesity, depression or many other causes. Labs will be ordered, and in the meanwhile, [name] will focus on self care including making healthy food choices, increasing physical activity and focusing on stress reduction.  - EKG 12-Lead - T3 -  T4, free - TSH  2. SOB (shortness of breath) on exertion Leslie Branch will gradually increase activities and exercise. Leslie Branch does feel that she gets out of breath more easily that she used to when she exercises. Leslie Branch's shortness of breath appears to be obesity related and exercise induced. She has agreed to work on weight loss and gradually increase exercise to treat her exercise induced shortness  of breath. Will continue to monitor closely.   3. Venous insufficiency of both lower extremities Leslie Branch will follow up with Vascular surgery if needed.  4. Iron deficiency anemia due to chronic blood loss Orders and follow up as documented in patient record. Leslie Branch will follow up with her Obstetrics and Gynecology.   Counseling Iron is essential for our bodies to make red blood cells.  Reasons that someone may be deficient include: an iron-deficient diet (more likely in those following vegan or vegetarian diets), women with heavy menses, patients with GI disorders or poor absorption, patients that have had bariatric surgery, frequent blood donors, patients with cancer, and patients with heart disease.   An iron supplement has been recommended. This is found over-the-counter. Iron-rich foods include dark leafy greens, red and white meats, eggs, seafood, and beans.   Certain foods and drinks prevent your body from absorbing iron properly. Avoid eating these foods in the same meal as iron-rich foods or with iron supplements. These foods include: coffee, black tea, and red wine; milk, dairy products, and foods that are high in calcium; beans and soybeans; whole grains.  Constipation can be a side effect of iron supplementation. Increased water and fiber intake are helpful. Water goal: > 2 liters/day. Fiber goal: > 25 grams/day.   5. Pre-diabetes We will check insulin today. Leslie Branch will continue to work on weight loss, exercise, and decreasing simple carbohydrates to help decrease the risk of diabetes.   - Insulin, random  6. Vitamin D deficiency Low Vitamin D level contributes to fatigue and are associated with obesity, breast, and colon cancer. Leslie Branch had labs recently and get these results and review.   7. Fatty liver Leslie Branch will work on diet and exercise. We discussed the likely diagnosis of non-alcoholic fatty liver disease today and how this condition is obesity related. Leslie Branch was educated the  importance of weight loss. Leslie Branch agreed to continue with her weight loss efforts with healthier diet and exercise as an essential part of her treatment plan.   8. Depression screen Leslie Branch had a positive depression screening. Depression is commonly associated with obesity and often results in emotional eating behaviors. We will monitor this closely and work on CBT to help improve the non-hunger eating patterns. Referral to Psychology may be required if no improvement is seen as she continues in our clinic.   9. Class 3 severe obesity with serious comorbidity and body mass index (BMI) of 40.0 to 44.9 in adult, unspecified obesity type [facility](HCC) Leslie Branch is currently in the action stage of change and her goal is to continue with weight loss efforts. I recommend Leslie Branch begin the structured treatment plan as follows:  She has agreed to the Category 4 Plan.  Leslie Branch will continue meal planning. She will decrease snacking after dinner except approved snacks.   Exercise goals: No exercise has been prescribed at this time.   Behavioral modification strategies: increasing lean protein intake, decreasing simple carbohydrates, increasing vegetables, increasing water intake, decreasing eating out, no skipping meals, meal planning and cooking strategies, keeping healthy foods in the home, and planning for success.  She was informed of the importance of frequent follow-up visits to maximize her success with intensive lifestyle modifications for her multiple health conditions. She was informed we would discuss her lab results at her next visit unless there is a critical issue that needs to be addressed sooner. Leslie Branch agreed to keep her next visit at the agreed upon time to discuss these results.  Objective:   Blood pressure 130/82, pulse 70, temperature 98.1 F (36.7 C), height 5\' 9"  (1.753 m), weight 284 lb (128.8 kg), last menstrual period 05/16/2021, SpO2 100 %, unknown if currently breastfeeding. Body mass index is  41.94 kg/m.  EKG: Normal sinus rhythm, rate 65 bpm.  Indirect Calorimeter completed today shows a VO2 of 386 and a REE of 2664.  Her calculated basal metabolic rate is 2665 thus her basal metabolic rate is better than expected.  General: Cooperative, alert, well developed, in no acute distress. HEENT: Conjunctivae and lids unremarkable. Cardiovascular: Regular rhythm.  Lungs: Normal work of breathing. Neurologic: No focal deficits.   Lab Results  Component Value Date   CREATININE 0.76 03/15/2021   BUN 11 03/15/2021   NA 141 03/15/2021   K 4.2 03/15/2021   CL 105 03/15/2021   CO2 21 03/15/2021   Lab Results  Component Value Date   ALT 23 03/15/2021   AST 19 03/15/2021   ALKPHOS 81 03/15/2021   BILITOT 0.4 03/15/2021   Lab Results  Component Value Date   HGBA1C 5.8 (H) 04/17/2021   No results found for: INSULIN Lab Results  Component Value Date   TSH 1.067 08/21/2016   No results found for: CHOL, HDL, LDLCALC, LDLDIRECT, TRIG, CHOLHDL Lab Results  Component Value Date   WBC 9.8 03/15/2021   HGB 12.8 03/15/2021   HCT 39.7 03/15/2021   MCV 83 03/15/2021   PLT 376 07/21/2019   Lab Results  Component Value Date   IRON 72 08/09/2018   TIBC 428 08/09/2018   FERRITIN 30 08/09/2018   Attestation Statements:   Reviewed by clinician on day of visit: allergies, medications, problem list, medical history, surgical history, family history, social history, and previous encounter notes.  I, 08/11/2018, RMA, am acting as [employer], DO.  I have reviewed the above documentation for accuracy and completeness, and I agree with the above. [employer], DO

## 2021-11-26 ENCOUNTER — Other Ambulatory Visit: Payer: Self-pay

## 2021-11-26 ENCOUNTER — Ambulatory Visit
Admission: EM | Admit: 2021-11-26 | Discharge: 2021-11-26 | Disposition: A | Discharge status: Home / Self Care | Payer: No Typology Code available for payment source | Attending: Physician Assistant | Admitting: Physician Assistant

## 2021-11-26 DIAGNOSIS — R112 Nausea with vomiting, unspecified: Secondary | ICD-10-CM

## 2021-11-26 DIAGNOSIS — J020 Streptococcal pharyngitis: Secondary | ICD-10-CM

## 2021-11-26 LAB — POCT RAPID STREP A (OFFICE): Rapid Strep A Screen: POSITIVE — AB

## 2021-11-26 MED ORDER — AMOXICILLIN 500 MG PO CAPS: 500.0000 mg | ORAL_CAPSULE | Freq: Three times a day (TID) | ORAL | 0 refills | Status: DC

## 2021-11-26 MED ORDER — ONDANSETRON 4 MG PO TBDP: 4.0000 mg | ORAL_TABLET | Freq: Three times a day (TID) | ORAL | 0 refills | Status: DC | PRN

## 2021-11-26 NOTE — ED Triage Notes (Signed)
Onset last night of chills, cough, congestion, nausea, sore throat, pain w/swallowing. Gaylyn Rong been taking tylenol. Confirms diarrhea. No emesis. Son dx with strep. Husband w/similar sxs. ?

## 2021-11-26 NOTE — ED Provider Notes (Signed)
?[facility] ? ? ? ?CSN: [id number] ?Arrival date & time: 11/26/21  1741 ? ? ?  ? ?History   ?Chief Complaint ?Chief Complaint  ?Patient presents with  ? Sore Throat  ? ? ?HPI ?Leslie Branch is a 36 y.o. female.  ? ?Here today for evaluation of chills, cough, congestion, nausea and sore throat that started last night.  Son was recently diagnosed with strep.  She has been taking Tylenol with mild relief.  She did vomit once today. ? ?The history is provided by the patient.  ?Sore Throat ?Pertinent negatives include no abdominal pain and no shortness of breath.  ? ?Past Medical History:  ?Diagnosis Date  ? Anemia   ? Blood clot in vein   ? right hand - thumb  ? Fatty liver   ? IBS (irritable bowel syndrome)   ? PONV (postoperative nausea and vomiting)   ? Pre-diabetes   ? medical hx  ? 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 Termination of pregnancy (fetus)   ? x 3 in clinics  ? ? ?Patient Active Problem List  ? Diagnosis Date Noted  ? Venous stasis dermatitis of left lower extremity 03/15/2021  ? Patellofemoral pain syndrome of both knees 02/26/2021  ? Venous insufficiency of both lower extremities 02/26/2021  ? Plantar fasciitis of right foot 02/06/2020  ? Gestational hypertension 12/03/2018  ? Obesity, morbid ([place]) 07/02/2018  ? Abnormal biochemical finding on antenatal screening of mother 07/02/2018  ? 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 Plantar fasciitis of left foot 12/02/2017  ? Iron deficiency anemia due to chronic blood loss 08/25/2016  ? Excessive bleeding 08/21/2016  ? ? ?Past Surgical History:  ?Procedure Laterality Date  ? COLONOSCOPY    ? DILATATION & CURRETTAGE/HYSTEROSCOPY WITH RESECTOCOPE N/A 07/28/2019  ? Procedure: DILATATION & CURETTAGE/HYSTEROSCOPY WITH RESECTOCOPE;  Surgeon: Sanjuana Kava, MD;  Location: [facility];  Service: Gynecology;  Laterality: N/A;  ? DILATION AND EVACUATION N/A 07/22/2017  ? Procedure: DILATATION AND EVACUATION WITH  GENETICS;  Surgeon: Tyson Dense, MD;  Location: [facility];  Service: Gynecology;  Laterality: N/A;  ? HAND SURGERY Left   ? pinky - reconstructive  ? KNEE ARTHROSCOPY Left   ? TONSILLECTOMY AND ADENOIDECTOMY    ? as a child  ? tubes in ears    ? as a child  ? UPPER GI ENDOSCOPY    ? ? ?OB History   ? ? Gravida  ?5  ? Para  ?1  ? Term  ?1  ? Preterm  ?   ? AB  ?4  ? Living  ?1  ?  ? ? SAB  ?1  ? IAB  ?3  ? Ectopic  ?   ? Multiple  ?0  ? Live Births  ?1  ?   ?  ?  ? ? ? ?Home Medications   ? ?Prior to Admission medications   ?Medication Sig Start Date End Date Taking? Authorizing Provider  ?amoxicillin (AMOXIL) 500 MG capsule Take 1 capsule (500 mg total) by mouth 3 (three) times daily. 11/26/21  Yes Francene Finders, PA-C  ?ondansetron (ZOFRAN-ODT) 4 MG disintegrating tablet Take 1 tablet (4 mg total) by mouth every 8 (eight) hours as needed. 11/26/21  Yes Francene Finders, PA-C  ?hydrochlorothiazide (HYDRODIURIL) 12.5 MG tablet Take 1 tablet (12.5 mg total) by mouth daily. ?Patient not taking: Reported on 11/20/2021 04/17/21   Minette Brine, [place]  ?levonorgestrel (MIRENA,  52 MG,) 20 MCG/DAY IUD 1 each by Intrauterine route once.    [provider]  ?valACYclovir (VALTREX) 1000 MG tablet Take 1,000 mg by mouth 2 (two) times daily.    [provider]  ? ? ?Family History ?Family History  ?Problem Relation Age of Onset  ? Hypertension Mother   ? Hyperlipidemia Mother   ? Sleep apnea Mother   ? Diabetes Father   ? Prostate cancer Father   ? High blood pressure Father   ? High Cholesterol Father   ? Drug abuse Father   ? Diabetes Sister   ? ? ?Social History ?Social History  ? ?Tobacco Use  ? Smoking status: Former  ?  Packs/day: 1.00  ?  Years: 10.00  ?  Pack years: 10.00  ?  Types: Cigarettes  ?  Quit date: 09/15/2010  ?  Years since quitting: 11.2  ? Smokeless tobacco: Never  ?Vaping Use  ? Vaping Use: Never used  ?Substance Use Topics  ? Alcohol use: Yes  ?  Alcohol/week: 1.0 standard drink  ?  Types:  1 Cans of beer per week  ?  Comment: occassional  ? Drug use: No  ? ? ? ?Allergies   ?Chloraseptic sore throat [acetaminophen], Eucalyptol, and Other ? ? ?Review of Systems ?Review of Systems  ?Constitutional:  Positive for chills and fever.  ?HENT:  Positive for congestion and sore throat. Negative for ear pain.   ?Eyes:  Negative for discharge and redness.  ?Respiratory:  Positive for cough. Negative for shortness of breath and wheezing.   ?Gastrointestinal:  Positive for diarrhea, nausea and vomiting. Negative for abdominal pain.  ? ? ?Physical Exam ?Triage Vital Signs ?ED Triage Vitals [11/26/21 1826]  ?Enc Vitals Group  ?   BP   ?   Pulse   ?   Resp   ?   Temp   ?   Temp src   ?   SpO2   ?   Weight   ?   Height   ?   Head Circumference   ?   Peak Flow   ?   Pain Score 7  ?   Pain Loc   ?   Pain Edu?   ?   Excl. in [place]?   ? ?No data found. ? ?Updated Vital Signs ?BP 127/87 (BP Location: Left Arm)   Pulse 94   Temp 100.1 ?F (37.8 ?C) (Oral)   Resp 18   SpO2 97%  ?  ? ?Physical Exam ?Vitals and nursing note reviewed.  ?Constitutional:   ?   General: She is not in acute distress. ?   Appearance: Normal appearance. She is not ill-appearing.  ?HENT:  ?   Head: Normocephalic and atraumatic.  ?   Nose: Congestion present.  ?   Mouth/Throat:  ?   Mouth: Mucous membranes are moist.  ?   Pharynx: Posterior oropharyngeal erythema present. No oropharyngeal exudate.  ?Eyes:  ?   Conjunctiva/sclera: Conjunctivae normal.  ?Cardiovascular:  ?   Rate and Rhythm: Normal rate.  ?Pulmonary:  ?   Effort: Pulmonary effort is normal. No respiratory distress.  ?Skin: ?   General: Skin is warm and dry.  ?Neurological:  ?   Mental Status: She is alert.  ?Psychiatric:     ?   Mood and Affect: Mood normal.     ?   Thought Content: Thought content normal.  ? ? ? ?UC Treatments / Results  ?Labs ?(all labs ordered are  listed, but only abnormal results are displayed) ?Labs Reviewed  ?POCT RAPID STREP A (OFFICE) - Abnormal; Notable for the  following components:  ?    Result Value  ? Rapid Strep A Screen Positive (*)   ? All other components within normal limits  ? ? ?EKG ? ? ?Radiology ?No results found. ? ?Procedures ?Procedures (including critical care time) ? ?Medications Ordered in UC ?Medications - No data to display ? ?Initial Impression / Assessment and Plan / UC Course  ?I have reviewed the triage vital signs and the nursing notes. ? ?Pertinent labs & imaging results that were available during my care of the patient were reviewed by me and considered in my medical decision making (see chart for details). ? ?Strep test positive in office.  Will treat with amoxicillin and Zofran prescribed for nausea.  Encouraged follow-up if symptoms fail to improve or worsen. ? ?Final Clinical Impressions(s) / UC Diagnoses  ? ?Final diagnoses:  ?Streptococcal sore throat  ?Nausea and vomiting, unspecified vomiting type  ? ?Discharge Instructions   ?None ?  ? ?ED Prescriptions   ? ? Medication Sig Dispense Auth. Provider  ? amoxicillin (AMOXIL) 500 MG capsule Take 1 capsule (500 mg total) by mouth 3 (three) times daily. 21 capsule Ewell Poe F, PA-C  ? ondansetron (ZOFRAN-ODT) 4 MG disintegrating tablet Take 1 tablet (4 mg total) by mouth every 8 (eight) hours as needed. 20 tablet Francene Finders, PA-C  ? ?  ? ?PDMP not reviewed this encounter. ?  ?Francene Finders, PA-C ?11/26/21 1916 ? ?

## 2021-12-04 ENCOUNTER — Ambulatory Visit (INDEPENDENT_AMBULATORY_CARE_PROVIDER_SITE_OTHER): Payer: No Typology Code available for payment source | Admitting: Bariatrics

## 2021-12-04 ENCOUNTER — Encounter (INDEPENDENT_AMBULATORY_CARE_PROVIDER_SITE_OTHER): Payer: Self-pay | Admitting: Bariatrics

## 2021-12-04 ENCOUNTER — Other Ambulatory Visit: Payer: Self-pay

## 2021-12-04 VITALS — BP 135/82 | HR 67 | Temp 98.1°F | Ht 69.0 in | Wt 280.0 lb

## 2021-12-04 DIAGNOSIS — I872 Venous insufficiency (chronic) (peripheral): Secondary | ICD-10-CM

## 2021-12-04 DIAGNOSIS — R7303 Prediabetes: Secondary | ICD-10-CM | POA: Diagnosis not present

## 2021-12-04 DIAGNOSIS — E669 Obesity, unspecified: Secondary | ICD-10-CM | POA: Diagnosis not present

## 2021-12-04 DIAGNOSIS — Z6841 Body Mass Index (BMI) 40.0 and over, adult: Secondary | ICD-10-CM | POA: Diagnosis not present

## 2021-12-06 NOTE — Progress Notes (Signed)
? ? ? ?Chief Complaint:  ? ?OBESITY ?Leslie Branch is here to discuss her progress with her obesity treatment plan along with follow-up of her obesity related diagnoses. Leslie Branch is on the Category 4 Plan and states she is following her eating plan approximately 80-85% of the time. Leslie Branch states she is doing cardio and strength training for 1-5 hours 5 times per week. ? ?Today's visit was #: 2 ?Starting weight: 284 lbs ?Starting date: 11/20/2021 ?Today's weight: 280 lbs ?Today's date: 12/04/2021 ?Total lbs lost to date: 4 lbs ?Total lbs lost since last in-office visit: 4 lbs ? ?Interim History: Leslie Branch is down another 4 lbs since her first visit. She did okay with the plan.  ? ?Subjective:  ? ?1. Pre-diabetes ?Leslie Branch's A1C was 5.8 and insulin was 17.3 04/2021. ? ?2. Venous insufficiency of both lower extremities ?Leslie Branch notes minor edema. ? ?Assessment/Plan:  ? ?1. Pre-diabetes ?Leslie Branch will minimize carbohydrates (sweets and starches). She was provided both handouts for insulin and pre-diabetes. She will continue to work on weight loss, exercise, and decreasing simple carbohydrates to help decrease the risk of diabetes.  ? ?2. Venous insufficiency of both lower extremities ?Leslie Branch will continue exercise and walking. She will wear compression stockings for long runs.  ? ?3. Obesity, current BMI 41.4 ?Leslie Branch is currently in the action stage of change. As such, her goal is to continue with weight loss efforts. She has agreed to the Category 4 Plan and keeping a food journal and adhering to recommended goals of 1800 calories and 100-120 protein.  ? ?Leslie Branch will continue meal planning. She will continue meal planning. She will adhere to the plan 80-90%. We reviewed labs from 11/26/2021 insulin, thyroid panel T4 and T3. ? ?Exercise goals:  Leslie Branch is doing a gym challenge. ? ?Behavioral modification strategies: increasing lean protein intake, decreasing simple carbohydrates, increasing vegetables, increasing water intake, decreasing eating out, no  skipping meals, meal planning and cooking strategies, keeping healthy foods in the home, and planning for success. ? ?Leslie Branch has agreed to follow-up with our clinic in 2 weeks. She was informed of the importance of frequent follow-up visits to maximize her success with intensive lifestyle modifications for her multiple health conditions.  ? ?Objective:  ? ?Blood pressure 135/82, pulse 67, temperature 98.1 ?F (36.7 ?C), height 5\' 9"  (1.753 m), weight 280 lb (127 kg), SpO2 98 %, unknown if currently breastfeeding. ?Body mass index is 41.35 kg/m?. ? ?General: Cooperative, alert, well developed, in no acute distress. ?HEENT: Conjunctivae and lids unremarkable. ?Cardiovascular: Regular rhythm.  ?Lungs: Normal work of breathing. ?Neurologic: No focal deficits.  ? ?Lab Results  ?Component Value Date  ? CREATININE 0.76 03/15/2021  ? BUN 11 03/15/2021  ? NA 141 03/15/2021  ? K 4.2 03/15/2021  ? CL 105 03/15/2021  ? CO2 21 03/15/2021  ? ?Lab Results  ?Component Value Date  ? ALT 23 03/15/2021  ? AST 19 03/15/2021  ? ALKPHOS 81 03/15/2021  ? BILITOT 0.4 03/15/2021  ? ?Lab Results  ?Component Value Date  ? HGBA1C 5.8 (H) 04/17/2021  ? ?Lab Results  ?Component Value Date  ? INSULIN 17.3 11/20/2021  ? ?Lab Results  ?Component Value Date  ? TSH 1.390 11/20/2021  ? ?No results found for: CHOL, HDL, LDLCALC, LDLDIRECT, TRIG, CHOLHDL ?No results found for: VD25OH ?Lab Results  ?Component Value Date  ? WBC 9.8 03/15/2021  ? HGB 12.8 03/15/2021  ? HCT 39.7 03/15/2021  ? MCV 83 03/15/2021  ? PLT 376 07/21/2019  ? ?Lab Results  ?  Component Value Date  ? IRON 72 08/09/2018  ? TIBC 428 08/09/2018  ? FERRITIN 30 08/09/2018  ? ?Attestation Statements:  ? ?Reviewed by clinician on day of visit: allergies, medications, problem list, medical history, surgical history, family history, social history, and previous encounter notes. ? ?I, Jackson Latino, RMA, am acting as transcriptionist for [employer], DO. ? ?I have reviewed the above documentation  for accuracy and completeness, and I agree with the above. Corinna Capra, DO  ?

## 2021-12-09 ENCOUNTER — Encounter (INDEPENDENT_AMBULATORY_CARE_PROVIDER_SITE_OTHER): Payer: Self-pay | Admitting: Bariatrics

## 2021-12-10 ENCOUNTER — Encounter: Payer: No Typology Code available for payment source | Admitting: Nurse Practitioner

## 2021-12-17 ENCOUNTER — Encounter (INDEPENDENT_AMBULATORY_CARE_PROVIDER_SITE_OTHER): Payer: Self-pay | Admitting: Nurse Practitioner

## 2021-12-17 ENCOUNTER — Ambulatory Visit (INDEPENDENT_AMBULATORY_CARE_PROVIDER_SITE_OTHER): Payer: No Typology Code available for payment source | Admitting: Nurse Practitioner

## 2021-12-17 VITALS — BP 130/79 | HR 65 | Temp 97.9°F | Ht 69.0 in | Wt 283.0 lb

## 2021-12-17 DIAGNOSIS — Z6841 Body Mass Index (BMI) 40.0 and over, adult: Secondary | ICD-10-CM | POA: Diagnosis not present

## 2021-12-17 DIAGNOSIS — E669 Obesity, unspecified: Secondary | ICD-10-CM

## 2021-12-17 DIAGNOSIS — R7303 Prediabetes: Secondary | ICD-10-CM | POA: Diagnosis not present

## 2021-12-18 ENCOUNTER — Ambulatory Visit (INDEPENDENT_AMBULATORY_CARE_PROVIDER_SITE_OTHER): Payer: No Typology Code available for payment source | Admitting: Nurse Practitioner

## 2021-12-18 NOTE — Progress Notes (Signed)
? ? ? ?Chief Complaint:  ? ?OBESITY ?Leslie Branch is here to discuss her progress with her obesity treatment plan along with follow-up of her obesity related diagnoses. Leslie Branch is on the Category 4 Plan and keeping a food journal and adhering to recommended goals of 1800 calories and 100-120 grams of  protein and states she is following her eating plan approximately 90% of the time. Leslie Branch states she is running and gym exercise for 60 minutes 5 times per week. ? ?Today's visit was #: 3 ?Starting weight: 284 lbs ?Starting date: 11/20/2021 ?Today's weight: 283 lbs ?Today's date: 12/17/2021 ?Total lbs lost to date: 1 lb ?Total lbs lost since last in-office visit: 0 ? ?Interim History: Leslie Branch is frustrated with weight gain. She is currently training for an [place] on May 21 st and [place] in October. She is doing multiple races between May and October.  She is meeting calorie and protein goals.  She is not skipping meals. She reports hunger most of the time and denies cravings.  ? ?Subjective:  ? ?1. Pre-diabetes ?Leslie Branch's last A1C was 5.8 and her insulin was 17.3. She is not on medications and is not currently interested in starting a medications at this time.  ? ?Assessment/Plan:  ? ?1. Pre-diabetes ?Leslie Branch will continue to work on weight loss, exercise, and decreasing simple carbohydrates to help decrease the risk of diabetes.  ? ?2. Obesity, current BMI 41.8 ?Leslie Branch is currently in the action stage of change. As such, her goal is to continue with weight loss efforts. She has agreed to keeping a food journal and adhering to recommended goals of 2200 calories and 130 grams of protein.  ? ?I contacted Leslie Branch on the phone and had a long discussion about her meal plan.  I would like her to track and aim for 2200 calories and 130 grams of protein.  Will adjust at needed at her next visit.   ? ?Leslie Branch is not interested in starting a weight loss medication. ? ?Exercise goals:  As is. ? ?Behavioral modification strategies:  increasing lean protein intake, increasing water intake, and keeping a strict food journal. ? ?Leslie Branch has agreed to follow-up with our clinic in 2 weeks. She was informed of the importance of frequent follow-up visits to maximize her success with intensive lifestyle modifications for her multiple health conditions.  ? ?Objective:  ? ?Blood pressure 130/79, pulse 65, temperature 97.9 ?F (36.6 ?C), height 5\' 9"  (1.753 m), weight 283 lb (128.4 kg), SpO2 100 %, unknown if currently breastfeeding. ?Body mass index is 41.79 kg/m?. ? ?General: Cooperative, alert, well developed, in no acute distress. ?HEENT: Conjunctivae and lids unremarkable. ?Cardiovascular: Regular rhythm.  ?Lungs: Normal work of breathing. ?Neurologic: No focal deficits.  ? ?Lab Results  ?Component Value Date  ? CREATININE 0.76 03/15/2021  ? BUN 11 03/15/2021  ? NA 141 03/15/2021  ? K 4.2 03/15/2021  ? CL 105 03/15/2021  ? CO2 21 03/15/2021  ? ?Lab Results  ?Component Value Date  ? ALT 23 03/15/2021  ? AST 19 03/15/2021  ? ALKPHOS 81 03/15/2021  ? BILITOT 0.4 03/15/2021  ? ?Lab Results  ?Component Value Date  ? HGBA1C 5.8 (H) 04/17/2021  ? ?Lab Results  ?Component Value Date  ? INSULIN 17.3 11/20/2021  ? ?Lab Results  ?Component Value Date  ? TSH 1.390 11/20/2021  ? ?No results found for: CHOL, HDL, LDLCALC, LDLDIRECT, TRIG, CHOLHDL ?No results found for: VD25OH ?Lab Results  ?Component Value Date  ? WBC 9.8 03/15/2021  ?  HGB 12.8 03/15/2021  ? HCT 39.7 03/15/2021  ? MCV 83 03/15/2021  ? PLT 376 07/21/2019  ? ?Lab Results  ?Component Value Date  ? IRON 72 08/09/2018  ? TIBC 428 08/09/2018  ? FERRITIN 30 08/09/2018  ? ?Attestation Statements:  ? ?Reviewed by clinician on day of visit: allergies, medications, problem list, medical history, surgical history, family history, social history, and previous encounter notes. ? ?Time spent on visit including pre-visit chart review and post-visit care and charting was 30 minutes.  ? ?I, Lizbeth Bark, RMA, am acting  as Location manager for Everardo [employer], FNP. ? ?I have reviewed the above documentation for accuracy and completeness, and I agree with the above. Everardo Pacific, FNP  ?

## 2022-01-02 ENCOUNTER — Encounter (INDEPENDENT_AMBULATORY_CARE_PROVIDER_SITE_OTHER): Payer: Self-pay | Admitting: Bariatrics

## 2022-01-02 ENCOUNTER — Ambulatory Visit (INDEPENDENT_AMBULATORY_CARE_PROVIDER_SITE_OTHER): Payer: No Typology Code available for payment source | Admitting: Bariatrics

## 2022-01-02 VITALS — BP 133/81 | HR 57 | Temp 98.0°F | Ht 69.0 in | Wt 278.0 lb

## 2022-01-02 DIAGNOSIS — M222X1 Patellofemoral disorders, right knee: Secondary | ICD-10-CM | POA: Diagnosis not present

## 2022-01-02 DIAGNOSIS — E669 Obesity, unspecified: Secondary | ICD-10-CM

## 2022-01-02 DIAGNOSIS — Z6841 Body Mass Index (BMI) 40.0 and over, adult: Secondary | ICD-10-CM

## 2022-01-02 DIAGNOSIS — I872 Venous insufficiency (chronic) (peripheral): Secondary | ICD-10-CM | POA: Diagnosis not present

## 2022-01-02 DIAGNOSIS — M222X2 Patellofemoral disorders, left knee: Secondary | ICD-10-CM | POA: Diagnosis not present

## 2022-01-14 ENCOUNTER — Encounter (INDEPENDENT_AMBULATORY_CARE_PROVIDER_SITE_OTHER): Payer: Self-pay | Admitting: Bariatrics

## 2022-01-14 NOTE — Progress Notes (Signed)
? ? ? ?Chief Complaint:  ? ?OBESITY ?Leslie Branch is here to discuss her progress with her obesity treatment plan along with follow-up of her obesity related diagnoses. [name] is on the Category 4 Plan and states she is following her eating plan approximately 90% of the time. [name] states she is running 1-6 hrs, 4-5 times per week and doing HIT 60 minutes  2 times per week. ? ?Today's visit was #: 3 ?Starting weight: 28 lbs ?Starting date: 3/802023 ?Today's weight: 278 lbs ?Today's date: 01/02/2022 ?Total lbs lost to date: 6 lbs ?Total lbs lost since last in-office visit: 5 lbs ? ?Interim History: [name] is down an additional 5 lbs since her last visit. She followed the plane very closely. ? ?Subjective:  ? ?1. Patellofemoral pain syndrome of both knees ?[name] reports to have no pain. ? ?2. Venous insufficiency of both lower extremities ?[name] reports to be improving. ? ?Assessment/Plan:  ? ?1. Patellofemoral pain syndrome of both knees ?[name] reports running and HIIT exercises.  ? ?2. Venous insufficiency of both lower extremities ?Verble will follow up with her PCP. ? ?3. Obesity, current BMI 41.1 ?[name] is currently in the action stage of change. As such, her goal is to continue with weight loss efforts. She has agreed to the Category 4 Plan.  ? ?[name] has agreed to meal plan and get adequate water and protein. She will pack her lunch using "My Fitness Pal" daily. ?Exercise goals:  She plans on doing a marathon in the near future. ? ?Behavioral modification strategies: increasing lean protein intake, decreasing simple carbohydrates, increasing vegetables, increasing water intake, decreasing eating out, no skipping meals, meal planning and cooking strategies, keeping healthy foods in the home, and avoiding temptations. ? ?[name] has agreed to follow-up with our clinic in 3-4 weeks. She was informed of the importance of frequent follow-up visits to maximize her success with intensive lifestyle modifications for her multiple  health conditions.  ? ?Objective:  ? ?Blood pressure 133/81, pulse (!) 57, temperature 98 ?F (36.7 ?C), height 5\' 9"  (1.753 m), weight 278 lb (126.1 kg), SpO2 99 %, unknown if currently breastfeeding. ?Body mass index is 41.05 kg/m?. ? ?General: Cooperative, alert, well developed, in no acute distress. ?HEENT: Conjunctivae and lids unremarkable. ?Cardiovascular: Regular rhythm.  ?Lungs: Normal work of breathing. ?Neurologic: No focal deficits.  ? ?Lab Results  ?Component Value Date  ? CREATININE 0.76 03/15/2021  ? BUN 11 03/15/2021  ? NA 141 03/15/2021  ? K 4.2 03/15/2021  ? CL 105 03/15/2021  ? CO2 21 03/15/2021  ? ?Lab Results  ?Component Value Date  ? ALT 23 03/15/2021  ? AST 19 03/15/2021  ? ALKPHOS 81 03/15/2021  ? BILITOT 0.4 03/15/2021  ? ?Lab Results  ?Component Value Date  ? HGBA1C 5.8 (H) 04/17/2021  ? ?Lab Results  ?Component Value Date  ? INSULIN 17.3 11/20/2021  ? ?Lab Results  ?Component Value Date  ? TSH 1.390 11/20/2021  ? ?No results found for: CHOL, HDL, LDLCALC, LDLDIRECT, TRIG, CHOLHDL ?No results found for: VD25OH ?Lab Results  ?Component Value Date  ? WBC 9.8 03/15/2021  ? HGB 12.8 03/15/2021  ? HCT 39.7 03/15/2021  ? MCV 83 03/15/2021  ? PLT 376 07/21/2019  ? ?Lab Results  ?Component Value Date  ? IRON 72 08/09/2018  ? TIBC 428 08/09/2018  ? FERRITIN 30 08/09/2018  ? ?Attestation Statements:  ? ?Reviewed by clinician on day of visit: allergies, medications, problem list, medical history, surgical history, family history, social history,  and previous encounter notes. ? ?I, Paulla Fore, CMA, am acting as transcriptionist for Dr. Corinna Capra, DO.  ? ?I have reviewed the above documentation for accuracy and completeness, and I agree with the above. Corinna Capra, DO ? ?

## 2022-01-29 ENCOUNTER — Ambulatory Visit (INDEPENDENT_AMBULATORY_CARE_PROVIDER_SITE_OTHER): Payer: No Typology Code available for payment source | Admitting: Nurse Practitioner

## 2022-01-29 ENCOUNTER — Encounter (INDEPENDENT_AMBULATORY_CARE_PROVIDER_SITE_OTHER): Payer: Self-pay | Admitting: Nurse Practitioner

## 2022-01-29 VITALS — BP 139/75 | HR 59 | Temp 98.2°F | Ht 69.0 in | Wt 280.0 lb

## 2022-01-29 DIAGNOSIS — Z6841 Body Mass Index (BMI) 40.0 and over, adult: Secondary | ICD-10-CM

## 2022-01-29 DIAGNOSIS — E669 Obesity, unspecified: Secondary | ICD-10-CM

## 2022-01-29 DIAGNOSIS — R7303 Prediabetes: Secondary | ICD-10-CM | POA: Diagnosis not present

## 2022-01-30 NOTE — Progress Notes (Signed)
Chief Complaint:   OBESITY Leslie Branch is here to discuss her progress with her obesity treatment plan along with follow-up of her obesity related diagnoses. Leslie Branch is on both the Category 3 Plan and the Category 4 Plan and states she is following her eating plan approximately 90% of the time. Leslie Branch states she is running and gym exercise for 60 minutes 4-5 times per week.  Today's visit was #: 5 Starting weight: 284 lbs Starting date: 11/20/2021 Today's weight: 280 lbs Today's date: 01/29/2022 Total lbs lost to date: 4 lbs Total lbs lost since last in-office visit: 0  Interim History: Leslie Branch highest weight was 305 lbs. She is an avid runner and is looking forward to her [employer] on May 21st (34 miles). She is meeting protein and calories goal. She went to [place] since her last visit and got off track. She hasn't been tracking over the past couple of weeks.   Subjective:   1. Pre-diabetes Leslie Branch's last insulin was 17.3 and A1C was 5.8.  Denies polyphagia or cravings.   Assessment/Plan:   1. Pre-diabetes Leslie Branch will continue to work on weight loss, exercise, and decreasing simple carbohydrates to help decrease the risk of diabetes.   2. Obesity, current BMI 41.3 Leslie Branch is currently in the action stage of change. As such, her goal is to continue with weight loss efforts. She has agreed to keeping a food journal and adhering to recommended goals of 2200 calories and 130 plus grams of protein.   Exercise goals:  As is.   Behavioral modification strategies: increasing water intake, travel eating strategies, and planning for success.  Leslie Branch has agreed to follow-up with our clinic in 4 weeks. She was informed of the importance of frequent follow-up visits to maximize her success with intensive lifestyle modifications for her multiple health conditions.   Objective:   Blood pressure 139/75, pulse (!) 59, temperature 98.2 F (36.8 C), height 5\' 9"  (1.753 m), weight 280 lb (127 kg), SpO2  99 %, unknown if currently breastfeeding. Body mass index is 41.35 kg/m.  General: Cooperative, alert, well developed, in no acute distress. HEENT: Conjunctivae and lids unremarkable. Cardiovascular: Regular rhythm.  Lungs: Normal work of breathing. Neurologic: No focal deficits.   Lab Results  Component Value Date   CREATININE 0.76 03/15/2021   BUN 11 03/15/2021   NA 141 03/15/2021   K 4.2 03/15/2021   CL 105 03/15/2021   CO2 21 03/15/2021   Lab Results  Component Value Date   ALT 23 03/15/2021   AST 19 03/15/2021   ALKPHOS 81 03/15/2021   BILITOT 0.4 03/15/2021   Lab Results  Component Value Date   HGBA1C 5.8 (H) 04/17/2021   Lab Results  Component Value Date   INSULIN 17.3 11/20/2021   Lab Results  Component Value Date   TSH 1.390 11/20/2021   No results found for: CHOL, HDL, LDLCALC, LDLDIRECT, TRIG, CHOLHDL No results found for: 01/20/2022 Lab Results  Component Value Date   WBC 9.8 03/15/2021   HGB 12.8 03/15/2021   HCT 39.7 03/15/2021   MCV 83 03/15/2021   PLT 376 07/21/2019   Lab Results  Component Value Date   IRON 72 08/09/2018   TIBC 428 08/09/2018   FERRITIN 30 08/09/2018   Attestation Statements:   Reviewed by clinician on day of visit: allergies, medications, problem list, medical history, surgical history, family history, social history, and previous encounter notes.  Time spent on visit including pre-visit chart review and post-visit  care and charting was 30 minutes.   I, Jackson Latino, RMA, am acting as Energy manager for Irene Limbo, FNP.  I have reviewed the above documentation for accuracy and completeness, and I agree with the above. Irene Limbo, FNP

## 2022-02-03 ENCOUNTER — Encounter (INDEPENDENT_AMBULATORY_CARE_PROVIDER_SITE_OTHER): Payer: Self-pay | Admitting: Nurse Practitioner

## 2022-02-03 NOTE — Telephone Encounter (Signed)
Stephanie

## 2022-02-25 ENCOUNTER — Ambulatory Visit (INDEPENDENT_AMBULATORY_CARE_PROVIDER_SITE_OTHER): Payer: No Typology Code available for payment source | Admitting: Nurse Practitioner

## 2022-02-25 ENCOUNTER — Encounter (INDEPENDENT_AMBULATORY_CARE_PROVIDER_SITE_OTHER): Payer: Self-pay | Admitting: Nurse Practitioner

## 2022-02-25 VITALS — BP 130/82 | HR 69 | Temp 98.0°F | Ht 69.0 in | Wt 280.0 lb

## 2022-02-25 DIAGNOSIS — Z6841 Body Mass Index (BMI) 40.0 and over, adult: Secondary | ICD-10-CM

## 2022-02-25 DIAGNOSIS — E669 Obesity, unspecified: Secondary | ICD-10-CM

## 2022-02-25 DIAGNOSIS — R7303 Prediabetes: Secondary | ICD-10-CM

## 2022-02-26 NOTE — Progress Notes (Signed)
Chief Complaint:   OBESITY Leslie Branch is here to discuss her progress with her obesity treatment plan along with follow-up of her obesity related diagnoses. Leslie Branch is on the Category 4 Plan and states she is following her eating plan approximately 80% of the time. Leslie Branch states she is going to gym and running 60 minutes 5 times per week.  Today's visit was #: 6 Starting weight: 284 lbs Starting date: 11/20/2021 Today's weight: 280 lbs Today's date: 02/25/2022 Total lbs lost to date: 4 lbs Total lbs lost since last in-office visit: 0  Interim History: Leslie Branch feels good overall. She has a very supportive husband. Next goal race is [place] marathon in October. She is not skipping meals and meeting protein and calorie goals. Drinks enough water. Asking to decrease her calories due to a change in her mileage.   Subjective:   1. Pre-diabetes Leslie Branch denies hunger or cravings. She is not currently taking medications.  Assessment/Plan:   1. Pre-diabetes We will check fasting labs at next visit.  Leslie Branch will continue to work on weight loss, exercise, and decreasing simple carbohydrates to help decrease the risk of diabetes.    2. Obesity, current BMI 41.4 Leslie Branch is currently in the action stage of change. As such, her goal is to continue with weight loss efforts. She has agreed to keeping a food journal and adhering to recommended goals of 2000 calories and 130 grams of protein.   Exercise goals: As is.  We will check fasting labs at next visit.  Behavioral modification strategies: increasing lean protein intake, increasing water intake, and planning for success.  Leslie Branch has agreed to follow-up with our clinic in 4 weeks. She was informed of the importance of frequent follow-up visits to maximize her success with intensive lifestyle modifications for her multiple health conditions.   Objective:   Blood pressure 130/82, pulse 69, temperature 98 F (36.7 C), height 5\' 9"  (1.753 m), weight 280  lb (127 kg), SpO2 99 %, unknown if currently breastfeeding. Body mass index is 41.35 kg/m.  General: Cooperative, alert, well developed, in no acute distress. HEENT: Conjunctivae and lids unremarkable. Cardiovascular: Regular rhythm.  Lungs: Normal work of breathing. Neurologic: No focal deficits.   Lab Results  Component Value Date   CREATININE 0.76 03/15/2021   BUN 11 03/15/2021   NA 141 03/15/2021   K 4.2 03/15/2021   CL 105 03/15/2021   CO2 21 03/15/2021   Lab Results  Component Value Date   ALT 23 03/15/2021   AST 19 03/15/2021   ALKPHOS 81 03/15/2021   BILITOT 0.4 03/15/2021   Lab Results  Component Value Date   HGBA1C 5.8 (H) 04/17/2021   Lab Results  Component Value Date   INSULIN 17.3 11/20/2021   Lab Results  Component Value Date   TSH 1.390 11/20/2021   No results found for: "CHOL", "HDL", "LDLCALC", "LDLDIRECT", "TRIG", "CHOLHDL" No results found for: "VD25OH" Lab Results  Component Value Date   WBC 9.8 03/15/2021   HGB 12.8 03/15/2021   HCT 39.7 03/15/2021   MCV 83 03/15/2021   PLT 376 07/21/2019   Lab Results  Component Value Date   IRON 72 08/09/2018   TIBC 428 08/09/2018   FERRITIN 30 08/09/2018   Attestation Statements:   Reviewed by clinician on day of visit: allergies, medications, problem list, medical history, surgical history, family history, social history, and previous encounter notes.  Time spent on visit including pre-visit chart review and post-visit care and charting was  30 minutes.   I, Brendell Tyus, RMA, am acting as transcriptionist for Irene Limbo, FNP..  I have reviewed the above documentation for accuracy and completeness, and I agree with the above. Irene Limbo, FNP

## 2022-03-24 ENCOUNTER — Encounter (INDEPENDENT_AMBULATORY_CARE_PROVIDER_SITE_OTHER): Payer: Self-pay | Admitting: Nurse Practitioner

## 2022-03-24 ENCOUNTER — Ambulatory Visit (INDEPENDENT_AMBULATORY_CARE_PROVIDER_SITE_OTHER): Payer: Medicaid Other | Admitting: Nurse Practitioner

## 2022-03-24 VITALS — BP 148/92 | HR 68 | Temp 98.1°F | Ht 69.0 in | Wt 284.0 lb

## 2022-03-24 DIAGNOSIS — E669 Obesity, unspecified: Secondary | ICD-10-CM

## 2022-03-24 DIAGNOSIS — R638 Other symptoms and signs concerning food and fluid intake: Secondary | ICD-10-CM

## 2022-03-24 DIAGNOSIS — D500 Iron deficiency anemia secondary to blood loss (chronic): Secondary | ICD-10-CM | POA: Diagnosis not present

## 2022-03-24 DIAGNOSIS — Z6841 Body Mass Index (BMI) 40.0 and over, adult: Secondary | ICD-10-CM

## 2022-03-24 DIAGNOSIS — R7303 Prediabetes: Secondary | ICD-10-CM | POA: Diagnosis not present

## 2022-03-24 DIAGNOSIS — E559 Vitamin D deficiency, unspecified: Secondary | ICD-10-CM | POA: Diagnosis not present

## 2022-03-24 NOTE — Progress Notes (Unsigned)
Chief Complaint:   OBESITY Leslie Branch is here to discuss her progress with her obesity treatment plan along with follow-up of her obesity related diagnoses. Leslie Branch is on the Category 4 Plan and states she is following her eating plan approximately 90% of the time. Leslie Branch states she is going to the gym and running 60 minutes 3 times per week.  Today's visit was #: 7 Starting weight: 284 lbs Starting date: 11/20/2021 Today's weight: 284 lbs Today's date: 03/24/2022 Total lbs lost to date: 0 lbs Total lbs lost since last in-office visit: 0  Interim History: Leslie Branch is frustrated with weight gain especially due to being very active. She is running 3 to 4 days per week, [employer], 2 days per week. Running T+R 2 miles +weekends at 8 miles. She is starting to training for [facility] marathon this week. Calories: 2000, Protein:unsure. Struggling with cravings.   Subjective:   1. Pre-diabetes Leslie Branch has never been on medication. She is struggling with cravings.  2. Abnormal craving Leslie Branch struggles with cravings.  3. Iron deficiency anemia due to chronic blood loss Leslie Branch had an IUD placed in January. Decrease bleeding with menses.  4. Vitamin D deficiency Leslie Branch struggles with headache when taking Vit D.  Assessment/Plan:   1. Pre-diabetes We will obtain labs today.  Leslie Branch will continue to work on weight loss, exercise, and decreasing simple carbohydrates to help decrease the risk of diabetes.    - Comprehensive metabolic panel - Insulin, random - Hemoglobin A1c  2. Abnormal craving Discussed Topamax, Wellbutrin and Metformin today.  Patient to consider her options.,    3. Iron deficiency anemia due to chronic blood loss We will obtain labs today.  - Comprehensive metabolic panel - CBC with Differential/Platelet - Iron  4. Vitamin D deficiency We will obtain labs today.  - Comprehensive metabolic panel - VITAMIN D 25 Hydroxy (Vit-D Deficiency, Fractures)  5. Obesity,  current BMI 42.0 Leslie Branch is currently in the action stage of change. As such, her goal is to continue with weight loss efforts. She has agreed to the Category 4 Plan.   Exercise goals: As is.  Behavioral modification strategies: increasing water intake, meal planning and cooking strategies, and planning for success.   Also discussed adding in resistance training.    Leslie Branch has agreed to follow-up with our clinic in 3 weeks. She was informed of the importance of frequent follow-up visits to maximize her success with intensive lifestyle modifications for her multiple health conditions.   Leslie Branch was informed we would discuss her lab results at her next visit unless there is a critical issue that needs to be addressed sooner. Leslie Branch agreed to keep her next visit at the agreed upon time to discuss these results.  Objective:   Blood pressure (!) 148/92, pulse 68, temperature 98.1 F (36.7 C), height 5\' 9"  (1.753 m), weight 284 lb (128.8 kg), SpO2 98 %, unknown if currently breastfeeding. Body mass index is 41.94 kg/m.  General: Cooperative, alert, well developed, in no acute distress. HEENT: Conjunctivae and lids unremarkable. Cardiovascular: Regular rhythm.  Lungs: Normal work of breathing. Neurologic: No focal deficits.   Lab Results  Component Value Date   CREATININE 0.76 03/15/2021   BUN 11 03/15/2021   NA 141 03/15/2021   K 4.2 03/15/2021   CL 105 03/15/2021   CO2 21 03/15/2021   Lab Results  Component Value Date   ALT 23 03/15/2021   AST 19 03/15/2021   ALKPHOS 81 03/15/2021   BILITOT  0.4 03/15/2021   Lab Results  Component Value Date   HGBA1C 5.8 (H) 04/17/2021   Lab Results  Component Value Date   INSULIN 17.3 11/20/2021   Lab Results  Component Value Date   TSH 1.390 11/20/2021   No results found for: "CHOL", "HDL", "LDLCALC", "LDLDIRECT", "TRIG", "CHOLHDL" No results found for: "VD25OH" Lab Results  Component Value Date   WBC 9.8 03/15/2021   HGB 12.8  03/15/2021   HCT 39.7 03/15/2021   MCV 83 03/15/2021   PLT 376 07/21/2019   Lab Results  Component Value Date   IRON 72 08/09/2018   TIBC 428 08/09/2018   FERRITIN 30 08/09/2018   Attestation Statements:   Reviewed by clinician on day of visit: allergies, medications, problem list, medical history, surgical history, family history, social history, and previous encounter notes.  I, Brendell Tyus, RMA, am acting as transcriptionist for Irene Limbo, FNP.  I have reviewed the above documentation for accuracy and completeness, and I agree with the above. Irene Limbo, FNP

## 2022-03-25 LAB — COMPREHENSIVE METABOLIC PANEL
ALT: 14 IU/L (ref 0–32)
AST: 14 IU/L (ref 0–40)
Albumin/Globulin Ratio: 1.6 (ref 1.2–2.2)
Albumin: 4.4 g/dL (ref 3.9–4.9)
Alkaline Phosphatase: 69 IU/L (ref 44–121)
BUN/Creatinine Ratio: 16 (ref 9–23)
BUN: 11 mg/dL (ref 6–20)
Bilirubin Total: 0.5 mg/dL (ref 0.0–1.2)
CO2: 24 mmol/L (ref 20–29)
Calcium: 9 mg/dL (ref 8.7–10.2)
Chloride: 100 mmol/L (ref 96–106)
Creatinine, Ser: 0.68 mg/dL (ref 0.57–1.00)
Globulin, Total: 2.7 g/dL (ref 1.5–4.5)
Glucose: 89 mg/dL (ref 70–99)
Potassium: 4.1 mmol/L (ref 3.5–5.2)
Sodium: 140 mmol/L (ref 134–144)
Total Protein: 7.1 g/dL (ref 6.0–8.5)
eGFR: 115 mL/min/{1.73_m2} (ref >59)

## 2022-03-25 LAB — CBC WITH DIFFERENTIAL/PLATELET
Basophils Absolute: 0.1 10*3/uL (ref 0.0–0.2)
Basos: 1 %
EOS (ABSOLUTE): 0.4 10*3/uL (ref 0.0–0.4)
Eos: 4 %
Hematocrit: 41.3 % (ref 34.0–46.6)
Hemoglobin: 13.2 g/dL (ref 11.1–15.9)
Immature Grans (Abs): 0 10*3/uL (ref 0.0–0.1)
Immature Granulocytes: 0 %
Lymphocytes Absolute: 2.6 10*3/uL (ref 0.7–3.1)
Lymphs: 25 %
MCH: 26.5 pg — ABNORMAL LOW (ref 26.6–33.0)
MCHC: 32 g/dL (ref 31.5–35.7)
MCV: 83 fL (ref 79–97)
Monocytes Absolute: 0.6 10*3/uL (ref 0.1–0.9)
Monocytes: 6 %
Neutrophils Absolute: 6.7 10*3/uL (ref 1.4–7.0)
Neutrophils: 64 %
Platelets: 316 10*3/uL (ref 150–450)
RBC: 4.99 x10E6/uL (ref 3.77–5.28)
RDW: 14.5 % (ref 11.7–15.4)
WBC: 10.5 10*3/uL (ref 3.4–10.8)

## 2022-03-25 LAB — HEMOGLOBIN A1C
Est. average glucose Bld gHb Est-mCnc: 117 mg/dL
Hgb A1c MFr Bld: 5.7 % — ABNORMAL HIGH (ref 4.8–5.6)

## 2022-03-25 LAB — INSULIN, RANDOM: INSULIN: 19.7 u[IU]/mL (ref 2.6–24.9)

## 2022-03-25 LAB — VITAMIN D 25 HYDROXY (VIT D DEFICIENCY, FRACTURES): Vit D, 25-Hydroxy: 27.9 ng/mL — ABNORMAL LOW (ref 30.0–100.0)

## 2022-03-25 LAB — IRON: Iron: 61 ug/dL (ref 27–159)

## 2022-04-17 ENCOUNTER — Encounter (INDEPENDENT_AMBULATORY_CARE_PROVIDER_SITE_OTHER): Payer: Self-pay | Admitting: Family Medicine

## 2022-04-17 ENCOUNTER — Ambulatory Visit (INDEPENDENT_AMBULATORY_CARE_PROVIDER_SITE_OTHER): Payer: Medicaid Other | Admitting: Family Medicine

## 2022-04-17 ENCOUNTER — Encounter: Payer: Self-pay | Admitting: Hematology

## 2022-04-17 VITALS — BP 147/90 | HR 65 | Temp 98.1°F | Ht 69.0 in

## 2022-04-17 DIAGNOSIS — F3289 Other specified depressive episodes: Secondary | ICD-10-CM | POA: Diagnosis not present

## 2022-04-17 DIAGNOSIS — E669 Obesity, unspecified: Secondary | ICD-10-CM

## 2022-04-17 DIAGNOSIS — Z6841 Body Mass Index (BMI) 40.0 and over, adult: Secondary | ICD-10-CM | POA: Diagnosis not present

## 2022-04-21 ENCOUNTER — Ambulatory Visit: Payer: No Typology Code available for payment source | Admitting: Internal Medicine

## 2022-04-21 ENCOUNTER — Encounter: Payer: Self-pay | Admitting: Internal Medicine

## 2022-04-21 ENCOUNTER — Encounter: Payer: Self-pay | Admitting: Hematology

## 2022-04-21 VITALS — BP 138/83 | HR 63 | Temp 97.6°F | Resp 16 | Ht 69.0 in | Wt 286.6 lb

## 2022-04-21 DIAGNOSIS — R0602 Shortness of breath: Secondary | ICD-10-CM | POA: Insufficient documentation

## 2022-04-21 DIAGNOSIS — I10 Essential (primary) hypertension: Secondary | ICD-10-CM | POA: Insufficient documentation

## 2022-04-21 DIAGNOSIS — R002 Palpitations: Secondary | ICD-10-CM | POA: Insufficient documentation

## 2022-04-21 NOTE — Progress Notes (Signed)
Patient referred by Jeri Lager, * for palpitations  Subjective:   Leslie Branch, female    DOB: 09-29-1985, 36 y.o.   MRN: [id number]   Chief Complaint  Patient presents with   Palpitations   New Patient (Initial Visit)    HPI  36 y.o.  female with palpitations here to establish care with cardiology. She does not have any cardiac history. She is a marathon runner and she is able to run 15-30 miles without any issues. She denies chest pain, SOB, dyspnea on exertion, diaphoresis, syncope, PND, and edema. She has been experiencing palpitations on and off for a few months now. She does notice when she is stressed the palpitations are more prevalent, however, patient is worried about her heart and would like to make sure her palpitations are not something more dangerous. Patient is agreeable to event monitor and echo.    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   Termination of pregnancy (fetus)    x 3 in clinics     Past Surgical History:  Procedure Laterality Date   COLONOSCOPY     DILATATION & CURRETTAGE/HYSTEROSCOPY WITH RESECTOCOPE N/A 07/28/2019   Procedure: DILATATION & CURETTAGE/HYSTEROSCOPY WITH RESECTOCOPE;  Surgeon: Essie Hart, MD;  Location: [facility] OR;  Service: Gynecology;  Laterality: N/A;   DILATION AND EVACUATION N/A 07/22/2017   Procedure: DILATATION AND EVACUATION WITH GENETICS;  Surgeon: Ranae Pila, MD;  Location: [facility];  Service: Gynecology;  Laterality: N/A;   HAND SURGERY Left    pinky - reconstructive   KNEE ARTHROSCOPY Left    TONSILLECTOMY AND ADENOIDECTOMY     as a child   tubes in ears     as a child   UPPER GI ENDOSCOPY       Social History   Tobacco Use  Smoking Status Former   Packs/day: 1.00    Years: 10.00   Total pack years: 10.00   Types: Cigarettes   Quit date: 09/15/2010   Years since quitting: 11.6  Smokeless Tobacco Never    Social History   Substance and Sexual Activity  Alcohol Use Yes   Alcohol/week: 1.0 standard drink of alcohol   Types: 1 Cans of beer per week   Comment: occassional     Family History  Problem Relation Age of Onset   Hypertension Mother    Hyperlipidemia Mother    Sleep apnea Mother    Diabetes Father    Prostate cancer Father    High blood pressure Father    High Cholesterol Father    Drug abuse Father    Diabetes Sister       Current Outpatient Medications:    levonorgestrel (MIRENA, 52 MG,) 20 MCG/DAY IUD, 1 each by Intrauterine route once., Disp: , Rfl:    valACYclovir (VALTREX) 1000 MG tablet, Take 1,000 mg by mouth 2 (two) times daily., Disp: , Rfl:    Cardiovascular and other pertinent studies:  Reviewed external labs and tests, independently interpreted   EKG 04/21/2022: NSR, HR 61       Review of Systems  Constitutional: Negative.  Eyes: Negative.   Cardiovascular:  Positive for irregular heartbeat and palpitations.  Respiratory: Negative.    Musculoskeletal: Negative.   Gastrointestinal: Negative.   All other systems reviewed and are negative.        Vitals:   04/21/22 0921 04/21/22 0928  BP: (!) 166/91 138/83  Pulse: 61 63  Resp: 16   Temp: 97.6 F (36.4 C)   SpO2: 98%      Body mass index is 42.32 kg/m. Filed Weights   04/21/22 0921  Weight: 286 lb 9.6 oz (130 kg)     Objective:   Physical Exam Constitutional:      General: She is not in acute distress.    Appearance: Normal appearance.  HENT:     Head: Normocephalic and atraumatic.  Neck:     Vascular: No carotid bruit.  Cardiovascular:     Rate and Rhythm: Normal rate and regular rhythm.     Pulses: Normal pulses.     Heart sounds: Normal heart sounds. No murmur heard.    No friction rub. No gallop.  Pulmonary:      Effort: Pulmonary effort is normal.     Breath sounds: Normal breath sounds.  Abdominal:     General: Bowel sounds are normal.     Palpations: Abdomen is soft.  Musculoskeletal:     Right lower leg: No edema.     Left lower leg: No edema.  Skin:    General: Skin is warm and dry.  Neurological:     General: No focal deficit present.     Mental Status: She is alert.  Psychiatric:        Mood and Affect: Mood normal.           Visit diagnoses:   ICD-10-CM   1. Palpitations  R00.2 EKG 12-Lead    PCV ECHOCARDIOGRAM COMPLETE    Cardiac event monitor    LONG TERM MONITOR-LIVE TELEMETRY (3-14 DAYS)    2. Shortness of breath  R06.02 PCV ECHOCARDIOGRAM COMPLETE    Cardiac event monitor    LONG TERM MONITOR-LIVE TELEMETRY (3-14 DAYS)    3. Essential hypertension  I10        Orders Placed This Encounter  Procedures   Cardiac event monitor   LONG TERM MONITOR-LIVE TELEMETRY (3-14 DAYS)   EKG 12-Lead   PCV ECHOCARDIOGRAM COMPLETE     Medication changes this visit: There are no discontinued medications.  No orders of the defined types were placed in this encounter.    Assessment & Recommendations:   Echocardiogram ordered Event monitor for 7-14 days Follow up in 6 weeks Continue with weight loss clinic Will monitor BP as pt is borderline elevated    Thank you for referring the patient to [place]. Please feel free to contact with any questions.   Clotilde Dieter, DO Office: [phone]

## 2022-04-21 NOTE — Patient Instructions (Signed)
Echo ordered Event monitor for 2 weeks Follow up in 6 weeks

## 2022-04-23 ENCOUNTER — Encounter (INDEPENDENT_AMBULATORY_CARE_PROVIDER_SITE_OTHER): Payer: Self-pay

## 2022-04-24 ENCOUNTER — Other Ambulatory Visit: Payer: Self-pay

## 2022-04-24 ENCOUNTER — Inpatient Hospital Stay: Payer: No Typology Code available for payment source

## 2022-04-24 DIAGNOSIS — R002 Palpitations: Secondary | ICD-10-CM

## 2022-04-24 DIAGNOSIS — R0602 Shortness of breath: Secondary | ICD-10-CM

## 2022-04-25 ENCOUNTER — Inpatient Hospital Stay: Payer: No Typology Code available for payment source

## 2022-04-25 DIAGNOSIS — R0602 Shortness of breath: Secondary | ICD-10-CM

## 2022-04-25 DIAGNOSIS — R002 Palpitations: Secondary | ICD-10-CM

## 2022-04-27 NOTE — Progress Notes (Unsigned)
Chief Complaint:   OBESITY Leslie Branch is here to discuss her progress with her obesity treatment plan along with follow-up of her obesity related diagnoses. Leslie Branch is on the Category 4 Plan and states she is following her eating plan approximately 0% of the time. Leslie Branch states she is taking an exercise class and running for 60 minutes 5 times per week.  Today's visit was #: 8 Starting weight: 284 lbs Starting date: 11/20/2021 Today's weight: 283 lbs Today's date: 04/17/2022 Total lbs lost to date: 1 Total lbs lost since last in-office visit: 1  Interim History: Leslie Branch was traveling and did some celebration eating.  She did less snacking.  She is getting back on track with her plan, but she is especially bored with her lunch plan.  Subjective:   1. Other depression, emotional eating binges Leslie Branch with p.m. snacking and mindless eating.  Assessment/Plan:   1. Other depression, emotional eating binges Emotional eating behavior strategies were discussed in depth, including why we do this and how to improve.  2. Obesity, Current BMI 41.8 Leslie Branch is currently in the action stage of change. As such, her goal is to continue with weight loss efforts. She has agreed to the Category 4 Plan or keeping a food journal and adhering to recommended goals of 1800 calories and 120 grams of protein daily.   Exercise goals: As is.   Behavioral modification strategies: increasing lean protein intake and emotional eating strategies.  Leslie Branch has agreed to follow-up with our clinic in 3 weeks. She was informed of the importance of frequent follow-up visits to maximize her success with intensive lifestyle modifications for her multiple health conditions.   Objective:   Blood pressure (!) 147/90, pulse 65, temperature 98.1 F (36.7 C), height 5\' 9"  (1.753 m), SpO2 98 %, unknown if currently breastfeeding. Body mass index is 41.94 kg/m.  General: Cooperative, alert, well developed, in no acute  distress. HEENT: Conjunctivae and lids unremarkable. Cardiovascular: Regular rhythm.  Lungs: Normal work of breathing. Neurologic: No focal deficits.   Lab Results  Component Value Date   CREATININE 0.68 03/24/2022   BUN 11 03/24/2022   NA 140 03/24/2022   K 4.1 03/24/2022   CL 100 03/24/2022   CO2 24 03/24/2022   Lab Results  Component Value Date   ALT 14 03/24/2022   AST 14 03/24/2022   ALKPHOS 69 03/24/2022   BILITOT 0.5 03/24/2022   Lab Results  Component Value Date   HGBA1C 5.7 (H) 03/24/2022   HGBA1C 5.8 (H) 04/17/2021   Lab Results  Component Value Date   INSULIN 19.7 03/24/2022   INSULIN 17.3 11/20/2021   Lab Results  Component Value Date   TSH 1.390 11/20/2021   No results found for: "CHOL", "HDL", "LDLCALC", "LDLDIRECT", "TRIG", "CHOLHDL" Lab Results  Component Value Date   VD25OH 27.9 (L) 03/24/2022   Lab Results  Component Value Date   WBC 10.5 03/24/2022   HGB 13.2 03/24/2022   HCT 41.3 03/24/2022   MCV 83 03/24/2022   PLT 316 03/24/2022   Lab Results  Component Value Date   IRON 61 03/24/2022   TIBC 428 08/09/2018   FERRITIN 30 08/09/2018   Attestation Statements:   Reviewed by clinician on day of visit: allergies, medications, problem list, medical history, surgical history, family history, social history, and previous encounter notes.  Time spent on visit including pre-visit chart review and post-visit care and charting was 30 minutes.   08/11/2018, am acting as  transcriptionist for Dennard Nip, MD.  I have reviewed the above documentation for accuracy and completeness, and I agree with the above. -  Dennard Nip, MD

## 2022-05-12 ENCOUNTER — Encounter (INDEPENDENT_AMBULATORY_CARE_PROVIDER_SITE_OTHER): Payer: Self-pay | Admitting: Nurse Practitioner

## 2022-05-12 ENCOUNTER — Ambulatory Visit (INDEPENDENT_AMBULATORY_CARE_PROVIDER_SITE_OTHER): Payer: Medicaid Other | Admitting: Nurse Practitioner

## 2022-05-12 VITALS — BP 145/84 | HR 62 | Temp 98.3°F | Ht 69.0 in | Wt 287.0 lb

## 2022-05-12 DIAGNOSIS — E669 Obesity, unspecified: Secondary | ICD-10-CM | POA: Diagnosis not present

## 2022-05-12 DIAGNOSIS — Z6841 Body Mass Index (BMI) 40.0 and over, adult: Secondary | ICD-10-CM

## 2022-05-12 DIAGNOSIS — E559 Vitamin D deficiency, unspecified: Secondary | ICD-10-CM

## 2022-05-13 ENCOUNTER — Encounter: Payer: Self-pay | Admitting: Hematology

## 2022-05-19 NOTE — Progress Notes (Signed)
Chief Complaint:   OBESITY Leslie Branch Branch here to discuss her progress with her obesity treatment plan along with follow-up of her obesity related diagnoses. Leslie Branch Branch on the Category 4 Plan and states she Branch following her eating plan approximately 50% of the time. Leslie Branch states she Branch at the gym 60 minutes 5 times per week.  Today's visit was #: 9 Starting weight: 284 lbs Starting date: 11/20/2021 Today's weight: 287 lbs Today's date: 05/12/2022 Total lbs lost to date: 0 Total lbs lost since last in-office visit: +4 lbs  Interim History: struggles with sweets and cravings and tends to be worse in the evenings.  Not interested in starting a medication.  Following plan for breakfast and lunch.  Branch been eating more sweets.  Branch not been going to the gym as she Branch in the past.  Still eating more calories but Branch not been exercising as she Branch in the past .  Drinking water. Denies sugary drinks.   Subjective:   1. Vitamin D deficiency Not on OTC Vitamin D due to side effects of migraines.  Not on a multivitamin.  Assessment/Plan:   1. Vitamin D deficiency Start multivitamin. Will monitor for headaches,   2. Obesity, Current BMI 42.4 To track and will review at next visit.   Leslie Branch currently in the action stage of change. As such, her goal Branch to continue with weight loss efforts. She Branch Branch to the Category 4 Plan.   Exercise goals:  as Branch.   Behavioral modification strategies: increasing lean protein intake, increasing vegetables, increasing water intake, and no skipping meals.  Leslie Branch to follow-up with our clinic in 3 weeks. She was informed of the importance of frequent follow-up visits to maximize her success with intensive lifestyle modifications for her multiple health conditions.   Objective:   Blood pressure (!) 145/84, pulse 62, temperature 98.3 F (36.8 C), height 5\' 9"  (1.753 m), weight 287 lb (130.2 kg), SpO2 98 %, unknown if currently  breastfeeding. Body mass index Branch 42.38 kg/m.  General: Cooperative, alert, well developed, in no acute distress. HEENT: Conjunctivae and lids unremarkable. Cardiovascular: Regular rhythm.  Lungs: Normal work of breathing. Neurologic: No focal deficits.   Lab Results  Component Value Date   CREATININE 0.68 03/24/2022   BUN 11 03/24/2022   NA 140 03/24/2022   K 4.1 03/24/2022   CL 100 03/24/2022   CO2 24 03/24/2022   Lab Results  Component Value Date   ALT 14 03/24/2022   AST 14 03/24/2022   ALKPHOS 69 03/24/2022   BILITOT 0.5 03/24/2022   Lab Results  Component Value Date   HGBA1C 5.7 (H) 03/24/2022   HGBA1C 5.8 (H) 04/17/2021   Lab Results  Component Value Date   INSULIN 19.7 03/24/2022   INSULIN 17.3 11/20/2021   Lab Results  Component Value Date   TSH 1.390 11/20/2021   No results found for: "CHOL", "HDL", "LDLCALC", "LDLDIRECT", "TRIG", "CHOLHDL" Lab Results  Component Value Date   VD25OH 27.9 (L) 03/24/2022   Lab Results  Component Value Date   WBC 10.5 03/24/2022   HGB 13.2 03/24/2022   HCT 41.3 03/24/2022   MCV 83 03/24/2022   PLT 316 03/24/2022   Lab Results  Component Value Date   IRON 61 03/24/2022   TIBC 428 08/09/2018   FERRITIN 30 08/09/2018    Attestation Statements:   Reviewed by clinician on day of visit: allergies, medications, problem list, medical history, surgical history,  family history, social history, and previous encounter notes.  I spent 30 minutes with the patient and reviewing the chart before and after the visit.   I, Malcolm Metro, RMA, am acting as transcriptionist for Irene Limbo, FNP  I have reviewed the above documentation for accuracy and completeness, and I agree with the above. Irene Limbo, FNP

## 2022-05-28 ENCOUNTER — Encounter: Payer: Self-pay | Admitting: Hematology

## 2022-05-28 ENCOUNTER — Ambulatory Visit: Payer: No Typology Code available for payment source

## 2022-05-28 DIAGNOSIS — R002 Palpitations: Secondary | ICD-10-CM

## 2022-05-28 DIAGNOSIS — R0602 Shortness of breath: Secondary | ICD-10-CM

## 2022-06-02 ENCOUNTER — Ambulatory Visit (INDEPENDENT_AMBULATORY_CARE_PROVIDER_SITE_OTHER): Payer: Medicaid Other | Admitting: Family Medicine

## 2022-06-02 ENCOUNTER — Encounter (INDEPENDENT_AMBULATORY_CARE_PROVIDER_SITE_OTHER): Payer: Self-pay

## 2022-06-03 ENCOUNTER — Ambulatory Visit (INDEPENDENT_AMBULATORY_CARE_PROVIDER_SITE_OTHER): Payer: Medicaid Other | Admitting: Family Medicine

## 2022-06-03 ENCOUNTER — Encounter (INDEPENDENT_AMBULATORY_CARE_PROVIDER_SITE_OTHER): Payer: Self-pay | Admitting: Family Medicine

## 2022-06-03 VITALS — BP 145/81 | HR 64 | Temp 98.3°F | Ht 69.0 in | Wt 288.0 lb

## 2022-06-03 DIAGNOSIS — Z6841 Body Mass Index (BMI) 40.0 and over, adult: Secondary | ICD-10-CM | POA: Diagnosis not present

## 2022-06-03 DIAGNOSIS — R7303 Prediabetes: Secondary | ICD-10-CM

## 2022-06-03 DIAGNOSIS — E669 Obesity, unspecified: Secondary | ICD-10-CM | POA: Diagnosis not present

## 2022-06-03 DIAGNOSIS — R030 Elevated blood-pressure reading, without diagnosis of hypertension: Secondary | ICD-10-CM

## 2022-06-05 ENCOUNTER — Encounter: Payer: Self-pay | Admitting: Internal Medicine

## 2022-06-05 ENCOUNTER — Ambulatory Visit: Payer: No Typology Code available for payment source | Admitting: Internal Medicine

## 2022-06-05 VITALS — BP 150/78 | HR 65 | Temp 97.9°F | Resp 16 | Ht 69.0 in | Wt 291.0 lb

## 2022-06-05 DIAGNOSIS — R0602 Shortness of breath: Secondary | ICD-10-CM

## 2022-06-05 DIAGNOSIS — I10 Essential (primary) hypertension: Secondary | ICD-10-CM

## 2022-06-05 DIAGNOSIS — R002 Palpitations: Secondary | ICD-10-CM

## 2022-06-05 MED ORDER — METOPROLOL SUCCINATE ER 25 MG PO TB24: 12.5000 mg | ORAL_TABLET | Freq: Every day | ORAL | 2 refills | Status: DC

## 2022-06-05 NOTE — Progress Notes (Signed)
Patient referred by No ref. provider found for palpitations  Subjective:   Leslie Branch, female    DOB: 07/03/1986, 36 y.o.   MRN: [id number]      HPI  36 y.o.  female with palpitations here to for a follow-up visit. She does not have any cardiac history. She is a marathon runner and she is able to run 15-30 miles without any issues. She denies chest pain, SOB, dyspnea on exertion, diaphoresis, syncope, PND, and edema. She is still having palpitations and her BP is elevated. Patient is ready to start taking medication to control her pressure. Her event monitor and echocardiogram were normal.    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   Termination of pregnancy (fetus)    x 3 in clinics     Past Surgical History:  Procedure Laterality Date   COLONOSCOPY     DILATATION & CURRETTAGE/HYSTEROSCOPY WITH RESECTOCOPE N/A 07/28/2019   Procedure: [place];  Surgeon: Sanjuana Kava, MD;  Location: [facility];  Service: Gynecology;  Laterality: N/A;   DILATION AND EVACUATION N/A 07/22/2017   Procedure: DILATATION AND EVACUATION WITH GENETICS;  Surgeon: Tyson Dense, MD;  Location: [facility];  Service: Gynecology;  Laterality: N/A;   HAND SURGERY Left    pinky - reconstructive   KNEE ARTHROSCOPY Left    TONSILLECTOMY AND ADENOIDECTOMY     as a child   tubes in ears     as a child   UPPER GI ENDOSCOPY       Social History   Tobacco Use  Smoking Status Former   Packs/day: 1.00   Years: 10.00   Total pack years: 10.00   Types: Cigarettes   Quit date: 09/15/2010   Years since quitting: 11.7  Smokeless Tobacco Never    Social History   Substance and Sexual Activity  Alcohol Use Yes   Alcohol/week: 1.0  standard drink of alcohol   Types: 1 Cans of beer per week   Comment: occassional     Family History  Problem Relation Age of Onset   Hypertension Mother    Hyperlipidemia Mother    Sleep apnea Mother    Diabetes Father    Prostate cancer Father    High blood pressure Father    High Cholesterol Father    Drug abuse Father    Diabetes Sister       Current Outpatient Medications:    levonorgestrel (MIRENA, 52 MG,) 20 MCG/DAY IUD, 1 each by Intrauterine route once., Disp: , Rfl:    metoprolol succinate (TOPROL XL) 25 MG 24 hr tablet, Take 0.5 tablets (12.5 mg total) by mouth daily., Disp: 30 tablet, Rfl: 2   valACYclovir (VALTREX) 1000 MG tablet, Take 1,000 mg by mouth 2 (two) times daily., Disp: , Rfl:    Cardiovascular and other pertinent studies:   Echocardiogram 05/28/2022:  Normal LV systolic function with visual EF 55-60%. Left ventricle cavity  is normal in size. Normal left ventricular wall thickness. Normal global  wall motion. Normal diastolic filling pattern, normal LAP. Calculated EF  56%.  Left atrial cavity is mildly dilated at 40.3 ml/m^2.  Structurally normal tricuspid  valve.  Mild tricuspid regurgitation. No  evidence of pulmonary hypertension.  no prior available for comparison    Zio Patch Extended out patient EKG monitoring 5 days starting 04/25/2022: Patch Wear Time:  5 days and 7 hours    Patient had a min HR of 49 bpm, max HR of 170 bpm, and avg HR of 84 bpm.  Predominant underlying rhythm was Sinus Rhythm.  Rare PACs and PVCs. No atrial fibrillation. No heart block. Short Ventricular Bigeminy and Trigeminy were present, no symptoms reported. Two patient triggered events during normal sinus rhythm.    Reviewed external labs and tests, independently interpreted   EKG 04/21/2022: NSR, HR 61       Review of Systems  Constitutional: Negative.  Eyes: Negative.   Cardiovascular:  Positive for palpitations.  Respiratory: Negative.     Musculoskeletal: Negative.   Gastrointestinal: Negative.   All other systems reviewed and are negative.        Vitals:   06/05/22 1042  BP: (!) 150/78  Pulse: 65  Resp: 16  Temp: 97.9 F (36.6 C)  SpO2: 97%     Body mass index is 42.97 kg/m. Filed Weights   06/05/22 1042  Weight: 291 lb (132 kg)     Objective:   Physical Exam Constitutional:      General: She is not in acute distress.    Appearance: Normal appearance.  HENT:     Head: Normocephalic and atraumatic.  Neck:     Vascular: No carotid bruit.  Cardiovascular:     Rate and Rhythm: Normal rate and regular rhythm.     Pulses: Normal pulses.     Heart sounds: Normal heart sounds. No murmur heard.    No friction rub. No gallop.  Pulmonary:     Effort: Pulmonary effort is normal.     Breath sounds: Normal breath sounds.  Abdominal:     General: Bowel sounds are normal.     Palpations: Abdomen is soft.  Musculoskeletal:     Right lower leg: No edema.     Left lower leg: No edema.  Skin:    General: Skin is warm and dry.  Neurological:     General: No focal deficit present.     Mental Status: She is alert.  Psychiatric:        Mood and Affect: Mood normal.           Visit diagnoses:   ICD-10-CM   1. Essential hypertension  I10     2. Palpitations  R00.2     3. Shortness of breath  R06.02        No orders of the defined types were placed in this encounter.    Medication changes this visit: There are no discontinued medications.  Meds ordered this encounter  Medications   metoprolol succinate (TOPROL XL) 25 MG 24 hr tablet    Sig: Take 0.5 tablets (12.5 mg total) by mouth daily.    Dispense:  30 tablet    Refill:  2     Assessment & Recommendations:    Patient is a 36yo female with palpitations and hypertension  Echocardiogram completed and discussed with patient - results were normal Event monitor worn - did not show any Afib, heart block, or dangerous arrhythmia Will  initiate Toprol 12.5mg  daily for palpitations and high blood pressure Encourage low-sodium diet, less than 2000 mg daily. Follow-up in 2-3 months or sooner if needed.     Clotilde Dieter, DO Office: [phone]

## 2022-06-10 NOTE — Progress Notes (Signed)
Chief Complaint:   OBESITY Leslie Branch is here to discuss her progress with her obesity treatment plan along with follow-up of her obesity related diagnoses. Leslie Branch is on the Category 4 Plan and states she is following her eating plan approximately 75% of the time. Leslie Branch states she is running and doing cardio for 60 minutes 5 times per week.  Today's visit was #: 10 Starting weight: 284 lbs Starting date: 11/20/2021 Today's weight: 288 lbs Today's date: 06/03/2022 Total lbs lost to date: 0 Total lbs lost since last in-office visit: 0  Interim History: Leslie Branch has been training for the [employer]. She had added Owyn protein shakes as a snack and her muscle mass has increased which is great.   Subjective:   1. Elevated blood pressure reading Leslie Branch is not on antihypertensives. She is following up with Cardiology.   2. Pre-diabetes Leslie Branch's A1c is starting to improve with her diet and weight loss.   Assessment/Plan:   1. Elevated blood pressure reading Leslie Branch is to check her blood pressures 2-3 times per week at home and she is to bring in her log to her next visit.  2. Pre-diabetes Leslie Branch will continue her diet, exercise, and decreasing simple carbohydrates to help decrease the risk of diabetes.   3. Obesity, Current BMI 42.6 Leslie Branch is currently in the action stage of change. As such, her goal is to continue with weight loss efforts. She has agreed to the Category 4 Plan.   Exercise goals: As is.   Behavioral modification strategies: increasing lean protein intake and increasing water intake.  Leslie Branch has agreed to follow-up with our clinic in 4 weeks. She was informed of the importance of frequent follow-up visits to maximize her success with intensive lifestyle modifications for her multiple health conditions.   Objective:   Blood pressure (!) 145/81, pulse 64, temperature 98.3 F (36.8 C), height 5\' 9"  (1.753 m), weight 288 lb (130.6 kg), SpO2 99 %, unknown if currently  breastfeeding. Body mass index is 42.53 kg/m.  General: Cooperative, alert, well developed, in no acute distress. HEENT: Conjunctivae and lids unremarkable. Cardiovascular: Regular rhythm.  Lungs: Normal work of breathing. Neurologic: No focal deficits.   Lab Results  Component Value Date   CREATININE 0.68 03/24/2022   BUN 11 03/24/2022   NA 140 03/24/2022   K 4.1 03/24/2022   CL 100 03/24/2022   CO2 24 03/24/2022   Lab Results  Component Value Date   ALT 14 03/24/2022   AST 14 03/24/2022   ALKPHOS 69 03/24/2022   BILITOT 0.5 03/24/2022   Lab Results  Component Value Date   HGBA1C 5.7 (H) 03/24/2022   HGBA1C 5.8 (H) 04/17/2021   Lab Results  Component Value Date   INSULIN 19.7 03/24/2022   INSULIN 17.3 11/20/2021   Lab Results  Component Value Date   TSH 1.390 11/20/2021   No results found for: "CHOL", "HDL", "LDLCALC", "LDLDIRECT", "TRIG", "CHOLHDL" Lab Results  Component Value Date   VD25OH 27.9 (L) 03/24/2022   Lab Results  Component Value Date   WBC 10.5 03/24/2022   HGB 13.2 03/24/2022   HCT 41.3 03/24/2022   MCV 83 03/24/2022   PLT 316 03/24/2022   Lab Results  Component Value Date   IRON 61 03/24/2022   TIBC 428 08/09/2018   FERRITIN 30 08/09/2018   Attestation Statements:   Reviewed by clinician on day of visit: allergies, medications, problem list, medical history, surgical history, family history, social history, and previous encounter  notes.  Time spent on visit including pre-visit chart review and post-visit care and charting was 30 minutes.   I, Trixie Dredge, am acting as transcriptionist for Dennard Nip, MD.  I have reviewed the above documentation for accuracy and completeness, and I agree with the above. -  Dennard Nip, MD

## 2022-07-01 ENCOUNTER — Ambulatory Visit (INDEPENDENT_AMBULATORY_CARE_PROVIDER_SITE_OTHER): Payer: Medicaid Other | Admitting: Family Medicine

## 2022-07-01 ENCOUNTER — Encounter (INDEPENDENT_AMBULATORY_CARE_PROVIDER_SITE_OTHER): Payer: Self-pay | Admitting: Family Medicine

## 2022-07-01 VITALS — BP 158/84 | HR 67 | Temp 98.2°F | Ht 69.0 in | Wt 286.0 lb

## 2022-07-01 DIAGNOSIS — E559 Vitamin D deficiency, unspecified: Secondary | ICD-10-CM | POA: Diagnosis not present

## 2022-07-01 DIAGNOSIS — E669 Obesity, unspecified: Secondary | ICD-10-CM

## 2022-07-01 DIAGNOSIS — I10 Essential (primary) hypertension: Secondary | ICD-10-CM | POA: Diagnosis not present

## 2022-07-01 DIAGNOSIS — R7303 Prediabetes: Secondary | ICD-10-CM | POA: Diagnosis not present

## 2022-07-01 DIAGNOSIS — Z6841 Body Mass Index (BMI) 40.0 and over, adult: Secondary | ICD-10-CM

## 2022-07-01 MED ORDER — VITAMIN D (ERGOCALCIFEROL) 1.25 MG (50000 UNIT) PO CAPS: 50000.0000 [IU] | ORAL_CAPSULE | ORAL | 0 refills | Status: DC

## 2022-07-07 NOTE — Progress Notes (Signed)
Chief Complaint:   OBESITY Leslie Branch is here to discuss her progress with her obesity treatment plan along with follow-up of her obesity related diagnoses. Leslie Branch is on the Category 4 Plan and states she is following her eating plan approximately 0% of the time. Leslie Branch states she is at the gym and running for 60+ minutes 2-3 times per week.  Today's visit was #: 11 Starting weight: 284 lbs Starting date: 11/20/2021 Today's weight: 286 lbs Today's date: 07/01/2022 Total lbs lost to date: 0 Total lbs lost since last in-office visit: 2  Interim History: Leslie Branch is doing better with weight loss. She struggles with meal planning and she gets bored with more structured eating plan. She is running 3 days per week 2 miles on 2 days 5-6 miles on the weekends.   Subjective:   1. Essential hypertension Leslie Branch was started on metoprolol 12.5 mg by Cardiology, but she just started taking with no side effects noted thus far. She just ran a marathon 7.5 hours.   2. Prediabetes Leslie Branch's last A1c 5.7. She is working on following a healthy eating plan to decrease weight. She wants to hold off on any medications and she would like to focus more on getting healthy dietary habits.   3. Vitamin D deficiency Leslie Branch's last Vitamin D level was 27. We discussed that she wil begin weekly Vitamin D 50,000 IU weekly.   Assessment/Plan:   1. Essential hypertension Leslie Branch will continue metoprolol and will continue healthy eating plan to encourage weight loss.   2. Prediabetes Leslie Branch will continue her eating plan, and we will recheck labs at her next visit.   3. Vitamin D deficiency Leslie Branch agreed to start prescription Vitamin D 50,000 IU every week with no refills. We will recheck Vitamin D, at least 2-3 times per year to avoid over-replacement.  - Vitamin D, Ergocalciferol, (DRISDOL) 1.25 MG (50000 UNIT) CAPS capsule; Take 1 capsule (50,000 Units total) by mouth every 7 (seven) days.  Dispense: 5 capsule; Refill:  0  4. Obesity, Current BMI 42.3 Leslie Branch is currently in the action stage of change. As such, her goal is to continue with weight loss efforts. She has agreed to change to keeping a food journal and adhering to recommended goals of 1600-2000 calories and 100+ grams of protein daily.   She will use My[employer]itness Pal. Handout for protein in foods given for ideas. We will recheck fasting labs at her next visit.   Exercise goals: As is.   Behavioral modification strategies: increasing lean protein intake, decreasing simple carbohydrates, keeping healthy foods in the home, better snacking choices, planning for success, and keeping a strict food journal.  Leslie Branch has agreed to follow-up with our clinic in 3 to 4 weeks. She was informed of the importance of frequent follow-up visits to maximize her success with intensive lifestyle modifications for her multiple health conditions.   Objective:   Blood pressure (!) 158/84, pulse 67, temperature 98.2 F (36.8 C), height 5\' 9"  (1.753 m), weight 286 lb (129.7 kg), SpO2 98 %, unknown if currently breastfeeding. Body mass index is 42.23 kg/m.  General: Cooperative, alert, well developed, in no acute distress. HEENT: Conjunctivae and lids unremarkable. Cardiovascular: Regular rhythm.  Lungs: Normal work of breathing. Neurologic: No focal deficits.   Lab Results  Component Value Date   CREATININE 0.68 03/24/2022   BUN 11 03/24/2022   NA 140 03/24/2022   K 4.1 03/24/2022   CL 100 03/24/2022   CO2 24 03/24/2022  Lab Results  Component Value Date   ALT 14 03/24/2022   AST 14 03/24/2022   ALKPHOS 69 03/24/2022   BILITOT 0.5 03/24/2022   Lab Results  Component Value Date   HGBA1C 5.7 (H) 03/24/2022   HGBA1C 5.8 (H) 04/17/2021   Lab Results  Component Value Date   INSULIN 19.7 03/24/2022   INSULIN 17.3 11/20/2021   Lab Results  Component Value Date   TSH 1.390 11/20/2021   No results found for: "CHOL", "HDL", "LDLCALC", "LDLDIRECT", "TRIG",  "CHOLHDL" Lab Results  Component Value Date   VD25OH 27.9 (L) 03/24/2022   Lab Results  Component Value Date   WBC 10.5 03/24/2022   HGB 13.2 03/24/2022   HCT 41.3 03/24/2022   MCV 83 03/24/2022   PLT 316 03/24/2022   Lab Results  Component Value Date   IRON 61 03/24/2022   TIBC 428 08/09/2018   FERRITIN 30 08/09/2018   Attestation Statements:   Reviewed by clinician on day of visit: allergies, medications, problem list, medical history, surgical history, family history, social history, and previous encounter notes.   I, Trixie Dredge, am acting as transcriptionist for Dennard Nip, MD.  I have reviewed the above documentation for accuracy and completeness, and I agree with the above. -  Dennard Nip, MD

## 2022-07-30 ENCOUNTER — Ambulatory Visit (INDEPENDENT_AMBULATORY_CARE_PROVIDER_SITE_OTHER): Payer: Medicaid Other | Admitting: Family Medicine

## 2022-07-30 ENCOUNTER — Encounter (INDEPENDENT_AMBULATORY_CARE_PROVIDER_SITE_OTHER): Payer: Self-pay | Admitting: Family Medicine

## 2022-07-30 VITALS — BP 153/89 | HR 65 | Temp 98.1°F | Ht 69.0 in | Wt 287.0 lb

## 2022-07-30 DIAGNOSIS — Z6841 Body Mass Index (BMI) 40.0 and over, adult: Secondary | ICD-10-CM

## 2022-07-30 DIAGNOSIS — R7303 Prediabetes: Secondary | ICD-10-CM | POA: Diagnosis not present

## 2022-07-30 DIAGNOSIS — E559 Vitamin D deficiency, unspecified: Secondary | ICD-10-CM

## 2022-07-30 DIAGNOSIS — I10 Essential (primary) hypertension: Secondary | ICD-10-CM | POA: Diagnosis not present

## 2022-07-30 DIAGNOSIS — E669 Obesity, unspecified: Secondary | ICD-10-CM

## 2022-07-30 MED ORDER — METOPROLOL SUCCINATE ER 25 MG PO TB24: 25.0000 mg | ORAL_TABLET | Freq: Every day | ORAL | 0 refills | Status: DC

## 2022-07-30 MED ORDER — VITAMIN D (ERGOCALCIFEROL) 1.25 MG (50000 UNIT) PO CAPS: 50000.0000 [IU] | ORAL_CAPSULE | ORAL | 0 refills | Status: DC

## 2022-08-01 LAB — CBC WITH DIFFERENTIAL/PLATELET
Basophils Absolute: 0.1 10*3/uL (ref 0.0–0.2)
Basos: 1 %
EOS (ABSOLUTE): 0.4 10*3/uL (ref 0.0–0.4)
Eos: 4 %
Hematocrit: 42.5 % (ref 34.0–46.6)
Hemoglobin: 14.1 g/dL (ref 11.1–15.9)
Immature Grans (Abs): 0 10*3/uL (ref 0.0–0.1)
Immature Granulocytes: 0 %
Lymphocytes Absolute: 2.3 10*3/uL (ref 0.7–3.1)
Lymphs: 23 %
MCH: 28.1 pg (ref 26.6–33.0)
MCHC: 33.2 g/dL (ref 31.5–35.7)
MCV: 85 fL (ref 79–97)
Monocytes Absolute: 0.7 10*3/uL (ref 0.1–0.9)
Monocytes: 7 %
Neutrophils Absolute: 6.7 10*3/uL (ref 1.4–7.0)
Neutrophils: 65 %
Platelets: 317 10*3/uL (ref 150–450)
RBC: 5.01 x10E6/uL (ref 3.77–5.28)
RDW: 13.5 % (ref 11.7–15.4)
WBC: 10.2 10*3/uL (ref 3.4–10.8)

## 2022-08-01 LAB — LIPID PANEL WITH LDL/HDL RATIO
Cholesterol, Total: 202 mg/dL — ABNORMAL HIGH (ref 100–199)
HDL: 45 mg/dL (ref >39)
LDL Chol Calc (NIH): 136 mg/dL — ABNORMAL HIGH (ref 0–99)
LDL/HDL Ratio: 3 ratio (ref 0.0–3.2)
Triglycerides: 119 mg/dL (ref 0–149)
VLDL Cholesterol Cal: 21 mg/dL (ref 5–40)

## 2022-08-01 LAB — CMP14+EGFR
ALT: 17 IU/L (ref 0–32)
AST: 18 IU/L (ref 0–40)
Albumin/Globulin Ratio: 2 (ref 1.2–2.2)
Albumin: 4.7 g/dL (ref 3.9–4.9)
Alkaline Phosphatase: 71 IU/L (ref 44–121)
BUN/Creatinine Ratio: 15 (ref 9–23)
BUN: 11 mg/dL (ref 6–20)
Bilirubin Total: 0.7 mg/dL (ref 0.0–1.2)
CO2: 21 mmol/L (ref 20–29)
Calcium: 9.4 mg/dL (ref 8.7–10.2)
Chloride: 101 mmol/L (ref 96–106)
Creatinine, Ser: 0.72 mg/dL (ref 0.57–1.00)
Globulin, Total: 2.3 g/dL (ref 1.5–4.5)
Glucose: 96 mg/dL (ref 70–99)
Potassium: 4.1 mmol/L (ref 3.5–5.2)
Sodium: 141 mmol/L (ref 134–144)
Total Protein: 7 g/dL (ref 6.0–8.5)
eGFR: 110 mL/min/{1.73_m2} (ref >59)

## 2022-08-01 LAB — VITAMIN D 25 HYDROXY (VIT D DEFICIENCY, FRACTURES): Vit D, 25-Hydroxy: 27.3 ng/mL — ABNORMAL LOW (ref 30.0–100.0)

## 2022-08-01 LAB — INSULIN, RANDOM: INSULIN: 22.5 u[IU]/mL (ref 2.6–24.9)

## 2022-08-01 LAB — TSH: TSH: 1.56 u[IU]/mL (ref 0.450–4.500)

## 2022-08-01 LAB — HEMOGLOBIN A1C
Est. average glucose Bld gHb Est-mCnc: 120 mg/dL
Hgb A1c MFr Bld: 5.8 % — ABNORMAL HIGH (ref 4.8–5.6)

## 2022-08-01 LAB — VITAMIN B12: Vitamin B-12: 723 pg/mL (ref 232–1245)

## 2022-08-03 ENCOUNTER — Telehealth: Payer: Medicaid Other | Admitting: Family

## 2022-08-03 DIAGNOSIS — B002 Herpesviral gingivostomatitis and pharyngotonsillitis: Secondary | ICD-10-CM

## 2022-08-03 MED ORDER — VALACYCLOVIR HCL 1 G PO TABS: 2000.0000 mg | ORAL_TABLET | Freq: Two times a day (BID) | ORAL | 1 refills | Status: DC

## 2022-08-03 NOTE — Progress Notes (Signed)

## 2022-08-12 NOTE — Progress Notes (Signed)
Chief Complaint:   OBESITY Leslie Branch is here to discuss her progress with her obesity treatment plan along with follow-up of her obesity related diagnoses. Leslie Branch is on keeping a food journal and adhering to recommended goals of 1600-2000 calories and 100+ grams of protein and states she is following her eating plan approximately 50% of the time. Leslie Branch states she is running and at the gym for 60 minutes 5 times per week.  Today's visit was #: 12 Starting weight: 284 lbs Starting date: 11/20/2021 Today's weight: 287 lbs Today's date: 07/30/2022 Total lbs lost to date: 0 Total lbs lost since last in-office visit: 0  Interim History: Leslie Branch is working on meal planning and getting into a routine.  She is trying to avoid significant holiday weight gain.  Subjective:   1. Pre-diabetes Leslie Branch is working on her diet and exercise, and she is due for labs.  She notes no change in polyphagia.  2. Vitamin D deficiency Leslie Branch is on vitamin D, and her last vitamin D level was not yet at goal.  She notes no change in fatigue.  3. Essential hypertension Leslie Branch's blood pressure remains elevated on one half dose of metoprolol.  She still notes occasional stress related palpitations.  She has a follow-up with cardiology in 2 to 3 months.  Assessment/Plan:   1. Pre-diabetes We will check labs today. Felicidad will continue to work on weight loss, exercise, and decreasing simple carbohydrates to help decrease the risk of diabetes.   - CMP14+EGFR - Vitamin B12 - Hemoglobin A1c - Insulin, random - Lipid Panel With LDL/HDL Ratio - TSH  2. Vitamin D deficiency We will check labs today, and we will refill prescription Vitamin D for 1 month. Kitt will follow-up for routine testing of Vitamin D, at least 2-3 times per year to avoid over-replacement.  - Vitamin D, Ergocalciferol, (DRISDOL) 1.25 MG (50000 UNIT) CAPS capsule; Take 1 capsule (50,000 Units total) by mouth every 7 (seven) days.  Dispense: 5  capsule; Refill: 0 - CBC with Differential/Platelet - VITAMIN D 25 Hydroxy (Vit-D Deficiency, Fractures)  3. Essential hypertension Leslie Branch agreed to increase metoprolol to 25 mg once daily, with no refills.  We will follow-up at her next visit.  - metoprolol succinate (TOPROL XL) 25 MG 24 hr tablet; Take 1 tablet (25 mg total) by mouth daily.  Dispense: 30 tablet; Refill: 0  4. Obesity, Current BMI 42.5 Leslie Branch is currently in the action stage of change. As such, her goal is to continue with weight loss efforts. She has agreed to keeping a food journal and adhering to recommended goals of 1600-2000 calories and 100+ grams of protein.   Exercise goals: As is.   Behavioral modification strategies: increasing lean protein intake, meal planning and cooking strategies, and travel eating strategies.  Leslie Branch has agreed to follow-up with our clinic in 4 weeks. She was informed of the importance of frequent follow-up visits to maximize her success with intensive lifestyle modifications for her multiple health conditions.   Leslie Branch was informed we would discuss her lab results at her next visit unless there is a critical issue that needs to be addressed sooner. Leslie Branch agreed to keep her next visit at the agreed upon time to discuss these results.  Objective:   Blood pressure (!) 153/89, pulse 65, temperature 98.1 F (36.7 C), height _0  (1.753 m), weight 287 lb (130.2 kg), SpO2 99 %, unknown if currently breastfeeding. Body mass index is 42.38 kg/m.  General: Cooperative, alert,  well developed, in no acute distress. HEENT: Conjunctivae and lids unremarkable. Cardiovascular: Regular rhythm.  Lungs: Normal work of breathing. Neurologic: No focal deficits.   Lab Results  Component Value Date   CREATININE 0.72 07/30/2022   BUN 11 07/30/2022   NA 141 07/30/2022   K 4.1 07/30/2022   CL 101 07/30/2022   CO2 21 07/30/2022   Lab Results  Component Value Date   ALT 17 07/30/2022   AST 18  07/30/2022   ALKPHOS 71 07/30/2022   BILITOT 0.7 07/30/2022   Lab Results  Component Value Date   HGBA1C 5.8 (H) 07/30/2022   HGBA1C 5.7 (H) 03/24/2022   HGBA1C 5.8 (H) 04/17/2021   Lab Results  Component Value Date   INSULIN 22.5 07/30/2022   INSULIN 19.7 03/24/2022   INSULIN 17.3 11/20/2021   Lab Results  Component Value Date   TSH 1.560 07/30/2022   Lab Results  Component Value Date   CHOL 202 (H) 07/30/2022   HDL 45 07/30/2022   LDLCALC 136 (H) 07/30/2022   TRIG 119 07/30/2022   Lab Results  Component Value Date   VD25OH 27.3 (L) 07/30/2022   VD25OH 27.9 (L) 03/24/2022   Lab Results  Component Value Date   WBC 10.2 07/30/2022   HGB 14.1 07/30/2022   HCT 42.5 07/30/2022   MCV 85 07/30/2022   PLT 317 07/30/2022   Lab Results  Component Value Date   IRON 61 03/24/2022   TIBC 428 08/09/2018   FERRITIN 30 08/09/2018   Attestation Statements:   Reviewed by clinician on day of visit: allergies, medications, problem list, medical history, surgical history, family history, social history, and previous encounter notes.  I have personally spent 47 minutes total time today in preparation, patient care, and documentation for this visit, including the following: review of clinical lab tests; review of medical tests/procedures/services.   I, Trixie Dredge, am acting as transcriptionist for Dennard Nip, MD.  I have reviewed the above documentation for accuracy and completeness, and I agree with the above. -  Dennard Nip, MD

## 2022-08-19 ENCOUNTER — Other Ambulatory Visit (INDEPENDENT_AMBULATORY_CARE_PROVIDER_SITE_OTHER): Payer: Self-pay | Admitting: Family Medicine

## 2022-08-19 DIAGNOSIS — I10 Essential (primary) hypertension: Secondary | ICD-10-CM

## 2022-08-27 ENCOUNTER — Ambulatory Visit (INDEPENDENT_AMBULATORY_CARE_PROVIDER_SITE_OTHER): Payer: No Typology Code available for payment source | Admitting: Nurse Practitioner

## 2022-08-27 ENCOUNTER — Encounter: Payer: Self-pay | Admitting: Nurse Practitioner

## 2022-08-27 ENCOUNTER — Encounter: Payer: Self-pay | Admitting: Hematology

## 2022-08-27 VITALS — BP 159/91 | HR 69 | Temp 98.1°F | Ht 69.0 in | Wt 289.0 lb

## 2022-08-27 DIAGNOSIS — Z6841 Body Mass Index (BMI) 40.0 and over, adult: Secondary | ICD-10-CM

## 2022-08-27 DIAGNOSIS — I10 Essential (primary) hypertension: Secondary | ICD-10-CM

## 2022-08-27 DIAGNOSIS — E559 Vitamin D deficiency, unspecified: Secondary | ICD-10-CM

## 2022-08-27 DIAGNOSIS — E669 Obesity, unspecified: Secondary | ICD-10-CM

## 2022-08-27 MED ORDER — VITAMIN D (ERGOCALCIFEROL) 1.25 MG (50000 UNIT) PO CAPS: 50000.0000 [IU] | ORAL_CAPSULE | ORAL | 0 refills | Status: DC

## 2022-09-01 ENCOUNTER — Other Ambulatory Visit (INDEPENDENT_AMBULATORY_CARE_PROVIDER_SITE_OTHER): Payer: Self-pay | Admitting: Family Medicine

## 2022-09-01 DIAGNOSIS — I10 Essential (primary) hypertension: Secondary | ICD-10-CM

## 2022-09-01 MED ORDER — METOPROLOL SUCCINATE ER 25 MG PO TB24: 25.0000 mg | ORAL_TABLET | Freq: Every day | ORAL | 0 refills | Status: DC

## 2022-09-01 NOTE — Telephone Encounter (Signed)
LAST APPOINTMENT DATE: 07/30/22 NEXT APPOINTMENT DATE: 10/02/22   [facility], [place] - [address] [facility] [place] [address] Phone: [phone] Fax: [phone]  Patient is requesting a refill of the following medications: Requested Prescriptions   Pending Prescriptions Disp Refills   metoprolol succinate (TOPROL XL) 25 MG 24 hr tablet 30 tablet 0    Sig: Take 1 tablet (25 mg total) by mouth daily.    Date last filled: 07/30/22 Previously prescribed by Dr. Dalbert Garnet  Lab Results  Component Value Date   HGBA1C 5.8 (H) 07/30/2022   HGBA1C 5.7 (H) 03/24/2022   HGBA1C 5.8 (H) 04/17/2021   Lab Results  Component Value Date   LDLCALC 136 (H) 07/30/2022   CREATININE 0.72 07/30/2022   Lab Results  Component Value Date   VD25OH 27.3 (L) 07/30/2022   VD25OH 27.9 (L) 03/24/2022    BP Readings from Last 3 Encounters:  08/27/22 (!) 159/91  07/30/22 (!) 153/89  07/01/22 (!) 158/84

## 2022-09-03 ENCOUNTER — Telehealth: Payer: Medicaid Other | Admitting: Physician Assistant

## 2022-09-03 DIAGNOSIS — J069 Acute upper respiratory infection, unspecified: Secondary | ICD-10-CM | POA: Diagnosis not present

## 2022-09-04 ENCOUNTER — Ambulatory Visit: Payer: No Typology Code available for payment source | Admitting: Internal Medicine

## 2022-09-04 MED ORDER — BENZONATATE 100 MG PO CAPS: 100.0000 mg | ORAL_CAPSULE | Freq: Three times a day (TID) | ORAL | 0 refills | Status: DC | PRN

## 2022-09-04 NOTE — Progress Notes (Signed)

## 2022-09-04 NOTE — Progress Notes (Signed)
I have spent 5 minutes in review of e-visit questionnaire, review and updating patient chart, medical decision making and response to patient.   Quinnley Colasurdo Cody Verner Mccrone, PA-C    

## 2022-09-09 NOTE — Progress Notes (Unsigned)
Chief Complaint:   OBESITY Leslie Branch is here to discuss her progress with her obesity treatment plan along with follow-up of her obesity related diagnoses. Leslie Branch is on the Category 4 Plan and states she is following her eating plan approximately 20% of the time. Leslie Branch states she is exercising 0 minutes 0 times per week.  Today's visit was #: 13 Starting weight: 284 lbs Starting date: 11/20/2021 Today's weight: 289 lbs Today's date: 08/27/2022 Total lbs lost to date: 0 lbs Total lbs lost since last in-office visit: 0  Interim History: Leslie Branch's son has been sick and she has not been able to go to the gym like she was in the past. She is scheduled for several upcoming races. Has not been on track but plans to start back in the [place].  Subjective:   1. Essential hypertension Labs discussed during visit today. Leslie Branch is taking Toprol XL 25 mg. Denies any side effects. Family history: multiple family members with hypertension. Has seen cardiology.  2. Vitamin D deficiency Leslie Branch is currently taking prescription Vit D 50,000 IU once a week. Denies any nausea, vomiting or muscle weakness.  Assessment/Plan:   1. Essential hypertension Continue to follow up with PCP and continue medications as directed.  Leslie Branch is working on healthy weight loss and exercise to improve blood pressure control. We will watch for signs of hypotension as she continues her lifestyle modifications.   2. Vitamin D deficiency We will refill Vit D 50K IU once a week for 1 month with 0 refills. Side effects discussed.   Low Vitamin D level contributes to fatigue and are associated with obesity, breast, and colon cancer. She agrees to continue to take prescription Vitamin D @50 ,000 IU every week and will follow-up for routine testing of Vitamin D, at least 2-3 times per year to avoid over-replacement.   -Refill Vitamin D, Ergocalciferol, (DRISDOL) 1.25 MG (50000 UNIT) CAPS capsule; Take 1 capsule (50,000 Units total)  by mouth every 7 (seven) days.  Dispense: 5 capsule; Refill: 0  3. Obesity, Current BMI 42.7 Leslie Branch is currently in the action stage of change. As such, her goal is to continue with weight loss efforts. She has agreed to keeping a food journal and adhering to recommended goals of 2000 calories and 100+ grams of protein.   Exercise goals: All adults should avoid inactivity. Some physical activity is better than none, and adults who participate in any amount of physical activity gain some health benefits.  IC at next visit. Labs reviewed in chart. Consider Zebound.  Behavioral modification strategies: increasing lean protein intake, increasing water intake, holiday eating strategies , and keeping a strict food journal.  Leslie Branch has agreed to follow-up with our clinic in 4 weeks. She was informed of the importance of frequent follow-up visits to maximize her success with intensive lifestyle modifications for her multiple health conditions.   Objective:   Blood pressure (!) 159/91, pulse 69, temperature 98.1 F (36.7 C), temperature source Oral, height 5\' 9"  (1.753 m), weight 289 lb (131.1 kg), SpO2 99 %, unknown if currently breastfeeding. Body mass index is 42.68 kg/m.  General: Cooperative, alert, well developed, in no acute distress. HEENT: Conjunctivae and lids unremarkable. Cardiovascular: Regular rhythm.  Lungs: Normal work of breathing. Neurologic: No focal deficits.   Lab Results  Component Value Date   CREATININE 0.72 07/30/2022   BUN 11 07/30/2022   NA 141 07/30/2022   K 4.1 07/30/2022   CL 101 07/30/2022   CO2 21  07/30/2022   Lab Results  Component Value Date   ALT 17 07/30/2022   AST 18 07/30/2022   ALKPHOS 71 07/30/2022   BILITOT 0.7 07/30/2022   Lab Results  Component Value Date   HGBA1C 5.8 (H) 07/30/2022   HGBA1C 5.7 (H) 03/24/2022   HGBA1C 5.8 (H) 04/17/2021   Lab Results  Component Value Date   INSULIN 22.5 07/30/2022   INSULIN 19.7 03/24/2022   INSULIN  17.3 11/20/2021   Lab Results  Component Value Date   TSH 1.560 07/30/2022   Lab Results  Component Value Date   CHOL 202 (H) 07/30/2022   HDL 45 07/30/2022   LDLCALC 136 (H) 07/30/2022   TRIG 119 07/30/2022   Lab Results  Component Value Date   VD25OH 27.3 (L) 07/30/2022   VD25OH 27.9 (L) 03/24/2022   Lab Results  Component Value Date   WBC 10.2 07/30/2022   HGB 14.1 07/30/2022   HCT 42.5 07/30/2022   MCV 85 07/30/2022   PLT 317 07/30/2022   Lab Results  Component Value Date   IRON 61 03/24/2022   TIBC 428 08/09/2018   FERRITIN 30 08/09/2018   Attestation Statements:   Reviewed by clinician on day of visit: allergies, medications, problem list, medical history, surgical history, family history, social history, and previous encounter notes.  I, Brendell Tyus, RMA, am acting as transcriptionist for Irene Limbo, FNP.  I have reviewed the above documentation for accuracy and completeness, and I agree with the above. Irene Limbo, FNP

## 2022-09-10 ENCOUNTER — Encounter: Payer: Self-pay | Admitting: Internal Medicine

## 2022-09-10 ENCOUNTER — Ambulatory Visit: Payer: No Typology Code available for payment source | Admitting: Internal Medicine

## 2022-09-10 VITALS — BP 170/110 | HR 66 | Ht 69.0 in | Wt 296.6 lb

## 2022-09-10 DIAGNOSIS — I10 Essential (primary) hypertension: Secondary | ICD-10-CM

## 2022-09-10 DIAGNOSIS — R002 Palpitations: Secondary | ICD-10-CM

## 2022-09-10 MED ORDER — ROSUVASTATIN CALCIUM 5 MG PO TABS: 5.0000 mg | ORAL_TABLET | Freq: Every day | ORAL | 3 refills | Status: DC

## 2022-09-10 MED ORDER — LOSARTAN POTASSIUM 50 MG PO TABS: 50.0000 mg | ORAL_TABLET | Freq: Two times a day (BID) | ORAL | 3 refills | Status: DC

## 2022-09-10 NOTE — Progress Notes (Signed)
Patient referred by No ref. provider found for palpitations  Subjective:   Leslie Branch, female    DOB: 11-20-1985, 36 y.o.   MRN: [id number]      Hypertension Associated symptoms include palpitations.    36 y.o.  female with palpitations here to for a follow-up visit. She finished her most recent marathon and is currently training for the [employer]. Patient states she feels great, but her blood pressure is extremely high today. Of note, she does admit that she has been taking ibuprofen around the clock recently and that is the only thing that has worked for her. We discussed how this could be contributing to her elevated pressures but patient states she needs it right now because nothing else works. She denies chest pain, SOB, dyspnea on exertion, diaphoresis, syncope, PND, and edema.    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   Termination of pregnancy (fetus)    x 3 in clinics     Past Surgical History:  Procedure Laterality Date   COLONOSCOPY     DILATATION & CURRETTAGE/HYSTEROSCOPY WITH RESECTOCOPE N/A 07/28/2019   Procedure: DILATATION & CURETTAGE/HYSTEROSCOPY WITH RESECTOCOPE;  Surgeon: Essie Hart, MD;  Location: [facility];  Service: Gynecology;  Laterality: N/A;   DILATION AND EVACUATION N/A 07/22/2017   Procedure: DILATATION AND EVACUATION WITH GENETICS;  Surgeon: Ranae Pila, MD;  Location: [facility];  Service: Gynecology;  Laterality: N/A;   HAND SURGERY Left    pinky - reconstructive   KNEE ARTHROSCOPY Left    TONSILLECTOMY AND ADENOIDECTOMY     as a child   tubes in ears     as a child   UPPER GI ENDOSCOPY       Social History   Tobacco Use  Smoking Status Former   Packs/day: 1.00   Years: 10.00   Total pack years:  10.00   Types: Cigarettes   Quit date: 09/15/2010   Years since quitting: 12.0  Smokeless Tobacco Never    Social History   Substance and Sexual Activity  Alcohol Use Yes   Alcohol/week: 1.0 standard drink of alcohol   Types: 1 Cans of beer per week   Comment: occassional     Family History  Problem Relation Age of Onset   Hypertension Mother    Hyperlipidemia Mother    Sleep apnea Mother    Diabetes Father    Prostate cancer Father    High blood pressure Father    High Cholesterol Father    Drug abuse Father    Diabetes Sister       Current Outpatient Medications:    benzonatate (TESSALON) 100 MG capsule, Take 1 capsule (100 mg total) by mouth 3 (three) times daily as needed for cough., Disp: 30 capsule, Rfl: 0   levonorgestrel (MIRENA, 52 MG,) 20 MCG/DAY IUD, 1 each by Intrauterine route once., Disp: , Rfl:    losartan (COZAAR) 50 MG tablet, Take 1 tablet (50 mg total) by mouth in the morning and at bedtime., Disp: 180 tablet, Rfl: 3   metoprolol succinate (TOPROL XL) 25 MG 24 hr tablet, Take 1 tablet (25 mg total) by mouth daily., Disp: 30  tablet, Rfl: 0   rosuvastatin (CRESTOR) 5 MG tablet, Take 1 tablet (5 mg total) by mouth at bedtime., Disp: 90 tablet, Rfl: 3   valACYclovir (VALTREX) 1000 MG tablet, Take 2 tablets (2,000 mg total) by mouth 2 (two) times daily., Disp: 4 tablet, Rfl: 1   Vitamin D, Ergocalciferol, (DRISDOL) 1.25 MG (50000 UNIT) CAPS capsule, Take 1 capsule (50,000 Units total) by mouth every 7 (seven) days., Disp: 5 capsule, Rfl: 0   Cardiovascular and other pertinent studies:   Echocardiogram 05/28/2022:  Normal LV systolic function with visual EF 55-60%. Left ventricle cavity  is normal in size. Normal left ventricular wall thickness. Normal global  wall motion. Normal diastolic filling pattern, normal LAP. Calculated EF  56%.  Left atrial cavity is mildly dilated at 40.3 ml/m^2.  Structurally normal tricuspid valve.  Mild tricuspid  regurgitation. No  evidence of pulmonary hypertension.  no prior available for comparison    Zio Patch Extended out patient EKG monitoring 5 days starting 04/25/2022: Patch Wear Time:  5 days and 7 hours    Patient had a min HR of 49 bpm, max HR of 170 bpm, and avg HR of 84 bpm.  Predominant underlying rhythm was Sinus Rhythm.  Rare PACs and PVCs. No atrial fibrillation. No heart block. Short Ventricular Bigeminy and Trigeminy were present, no symptoms reported. Two patient triggered events during normal sinus rhythm.    Reviewed external labs and tests, independently interpreted   EKG 04/21/2022: NSR, HR 61       Review of Systems  Constitutional: Negative.  Eyes: Negative.   Cardiovascular:  Positive for palpitations.  Respiratory: Negative.    Musculoskeletal: Negative.   Gastrointestinal: Negative.   All other systems reviewed and are negative.        Vitals:   09/10/22 1309 09/10/22 1320  BP: (!) 203/122 (!) 170/110  Pulse: 66   SpO2: 99%      Body mass index is 43.8 kg/m. Filed Weights   09/10/22 1309  Weight: 296 lb 9.6 oz (134.5 kg)     Objective:   Physical Exam Constitutional:      General: She is not in acute distress.    Appearance: Normal appearance.  HENT:     Head: Normocephalic and atraumatic.  Neck:     Vascular: No carotid bruit.  Cardiovascular:     Rate and Rhythm: Normal rate and regular rhythm.     Pulses: Normal pulses.     Heart sounds: Normal heart sounds. No murmur heard.    No friction rub. No gallop.  Pulmonary:     Effort: Pulmonary effort is normal.     Breath sounds: Normal breath sounds.  Abdominal:     General: Bowel sounds are normal.     Palpations: Abdomen is soft.  Musculoskeletal:     Right lower leg: No edema.     Left lower leg: No edema.  Skin:    General: Skin is warm and dry.  Neurological:     General: No focal deficit present.     Mental Status: She is alert.  Psychiatric:        Mood and  Affect: Mood normal.           Visit diagnoses:   ICD-10-CM   1. Essential hypertension  I10 PCV RENAL/RENAL ARTERY DUPLEX COMPLETE    Aldosterone + renin activity w/ ratio    Ambulatory referral to Nephrology    2. Palpitations  R00.2 PCV RENAL/RENAL ARTERY DUPLEX COMPLETE  Aldosterone + renin activity w/ ratio    Ambulatory referral to Nephrology       Orders Placed This Encounter  Procedures   Aldosterone + renin activity w/ ratio   Ambulatory referral to Nephrology   PCV RENAL/RENAL ARTERY DUPLEX COMPLETE     Medication changes this visit: There are no discontinued medications.  Meds ordered this encounter  Medications   losartan (COZAAR) 50 MG tablet    Sig: Take 1 tablet (50 mg total) by mouth in the morning and at bedtime.    Dispense:  180 tablet    Refill:  3   rosuvastatin (CRESTOR) 5 MG tablet    Sig: Take 1 tablet (5 mg total) by mouth at bedtime.    Dispense:  90 tablet    Refill:  3     Assessment & Recommendations:    Patient is a 36yo female with palpitations and hypertension  Essential hypertension Continue current cardiac medications. Will add losartan to current regiment Patient already taking full pill of toprol Will start statin given uncontrolled BP and HLD Renin/aldo ratio ordered and nephro referral provided Renal artery ultrasound ordered to eval for secondary HTN Encourage low-sodium diet, less than 2000 mg daily.   Palpitations Improved on metoprolol      Floydene Flock, DO Office: [phone]

## 2022-09-11 ENCOUNTER — Encounter: Payer: Self-pay | Admitting: Nurse Practitioner

## 2022-09-24 LAB — ALDOSTERONE + RENIN ACTIVITY W/ RATIO
Aldos/Renin Ratio: 25.4 (ref 0.0–30.0)
Aldosterone: 9 ng/dL (ref 0.0–30.0)
Renin Activity, Plasma: 0.355 ng/mL/hr (ref 0.167–5.380)

## 2022-09-25 ENCOUNTER — Ambulatory Visit: Payer: No Typology Code available for payment source

## 2022-09-25 DIAGNOSIS — I10 Essential (primary) hypertension: Secondary | ICD-10-CM

## 2022-09-25 DIAGNOSIS — R002 Palpitations: Secondary | ICD-10-CM

## 2022-10-02 ENCOUNTER — Ambulatory Visit: Payer: No Typology Code available for payment source | Admitting: Nurse Practitioner

## 2022-10-02 ENCOUNTER — Encounter: Payer: Self-pay | Admitting: Nurse Practitioner

## 2022-10-02 VITALS — BP 158/83 | HR 64 | Temp 97.8°F | Ht 69.0 in | Wt 288.0 lb

## 2022-10-02 DIAGNOSIS — I10 Essential (primary) hypertension: Secondary | ICD-10-CM | POA: Diagnosis not present

## 2022-10-02 DIAGNOSIS — R0602 Shortness of breath: Secondary | ICD-10-CM

## 2022-10-02 DIAGNOSIS — E669 Obesity, unspecified: Secondary | ICD-10-CM

## 2022-10-02 DIAGNOSIS — Z6841 Body Mass Index (BMI) 40.0 and over, adult: Secondary | ICD-10-CM | POA: Diagnosis not present

## 2022-10-02 MED ORDER — ZEPBOUND 2.5 MG/0.5ML ~~LOC~~ SOAJ: 2.5000 mg | SUBCUTANEOUS | 0 refills | Status: DC

## 2022-10-07 ENCOUNTER — Encounter: Payer: Self-pay | Admitting: Internal Medicine

## 2022-10-07 ENCOUNTER — Telehealth: Payer: No Typology Code available for payment source | Admitting: Family Medicine

## 2022-10-07 ENCOUNTER — Ambulatory Visit: Payer: No Typology Code available for payment source | Admitting: Internal Medicine

## 2022-10-07 VITALS — BP 152/85 | HR 67 | Ht 69.0 in | Wt 294.6 lb

## 2022-10-07 DIAGNOSIS — B001 Herpesviral vesicular dermatitis: Secondary | ICD-10-CM

## 2022-10-07 DIAGNOSIS — E782 Mixed hyperlipidemia: Secondary | ICD-10-CM

## 2022-10-07 DIAGNOSIS — R002 Palpitations: Secondary | ICD-10-CM

## 2022-10-07 DIAGNOSIS — I10 Essential (primary) hypertension: Secondary | ICD-10-CM

## 2022-10-07 MED ORDER — LOSARTAN POTASSIUM 50 MG PO TABS: 50.0000 mg | ORAL_TABLET | Freq: Every day | ORAL | 3 refills | Status: DC

## 2022-10-07 MED ORDER — VALACYCLOVIR HCL 1 G PO TABS: 2000.0000 mg | ORAL_TABLET | Freq: Two times a day (BID) | ORAL | 0 refills | Status: AC

## 2022-10-07 MED ORDER — OZEMPIC (0.25 OR 0.5 MG/DOSE) 2 MG/1.5ML ~~LOC~~ SOPN: 2.0000 mg | PEN_INJECTOR | SUBCUTANEOUS | 6 refills | Status: DC

## 2022-10-07 NOTE — Progress Notes (Signed)
Patient referred by No ref. provider found for palpitations  Subjective:   Leslie Branch, female    DOB: [birth year], 37 y.o.   MRN: [id number]      Hypertension Pertinent negatives include no palpitations.    37 y.o.  female with palpitations here to for a follow-up visit. She has stopped taking so much ibuprofen and her blood pressure is not as high as it was last visit. She is training for the [employer] and she needs to lose some weight so she can cut her time down. She is interested in trying Ozempic for this. Otherwise, patient has been doing well. Her palpitations have completely resolved with metoprolol. She denies chest pain, SOB, dyspnea on exertion, diaphoresis, syncope, PND, and edema.    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   Termination of pregnancy (fetus)    x 3 in clinics     Past Surgical History:  Procedure Laterality Date   COLONOSCOPY     DILATATION & CURRETTAGE/HYSTEROSCOPY WITH RESECTOCOPE N/A 07/28/2019   Procedure: DILATATION & CURETTAGE/HYSTEROSCOPY WITH RESECTOCOPE;  Surgeon: Essie Hart, MD;  Location: [facility];  Service: Gynecology;  Laterality: N/A;   DILATION AND EVACUATION N/A 07/22/2017   Procedure: DILATATION AND EVACUATION WITH GENETICS;  Surgeon: Ranae Pila, MD;  Location: [facility];  Service: Gynecology;  Laterality: N/A;   HAND SURGERY Left    pinky - reconstructive   KNEE ARTHROSCOPY Left    TONSILLECTOMY AND ADENOIDECTOMY     as a child   tubes in ears     as a child   UPPER GI ENDOSCOPY       Social History   Tobacco Use  Smoking Status Former   Packs/day: 1.00   Years: 10.00   Total pack years: 10.00   Types: Cigarettes   Quit date: 09/15/2010   Years since quitting: 12.0  Smokeless  Tobacco Never    Social History   Substance and Sexual Activity  Alcohol Use Yes   Alcohol/week: 1.0 standard drink of alcohol   Types: 1 Cans of beer per week   Comment: occassional     Family History  Problem Relation Age of Onset   Hypertension Mother    Hyperlipidemia Mother    Sleep apnea Mother    Diabetes Father    Prostate cancer Father    High blood pressure Father    High Cholesterol Father    Drug abuse Father    Diabetes Sister       Current Outpatient Medications:    levonorgestrel (MIRENA, 52 MG,) 20 MCG/DAY IUD, 1 each by Intrauterine route once., Disp: , Rfl:    metoprolol succinate (TOPROL XL) 25 MG 24 hr tablet, Take 1 tablet (25 mg total) by mouth daily., Disp: 30 tablet, Rfl: 0   rosuvastatin (CRESTOR) 5 MG tablet, Take 1 tablet (5 mg total) by mouth at bedtime., Disp: 90 tablet, Rfl: 3   Semaglutide,0.25 or 0.5MG /DOS, (OZEMPIC, 0.25 OR 0.5 MG/DOSE,) 2 MG/1.5ML SOPN, Inject 2 mg into the skin once a week., Disp: 1 mL, Rfl: 6   valACYclovir (VALTREX) 1000 MG tablet, Take 2 tablets (2,000 mg total) by mouth  2 (two) times daily for 1 day., Disp: 4 tablet, Rfl: 0   Vitamin D, Ergocalciferol, (DRISDOL) 1.25 MG (50000 UNIT) CAPS capsule, Take 1 capsule (50,000 Units total) by mouth every 7 (seven) days., Disp: 5 capsule, Rfl: 0   losartan (COZAAR) 50 MG tablet, Take 1 tablet (50 mg total) by mouth daily., Disp: 180 tablet, Rfl: 3   tirzepatide (ZEPBOUND) 2.5 MG/0.5ML Pen, Inject 2.5 mg into the skin once a week., Disp: 2 mL, Rfl: 0   Cardiovascular and other pertinent studies:   Echocardiogram 05/28/2022:  Normal LV systolic function with visual EF 55-60%. Left ventricle cavity  is normal in size. Normal left ventricular wall thickness. Normal global  wall motion. Normal diastolic filling pattern, normal LAP. Calculated EF  56%.  Left atrial cavity is mildly dilated at 40.3 ml/m^2.  Structurally normal tricuspid valve.  Mild tricuspid regurgitation. No   evidence of pulmonary hypertension.  no prior available for comparison    Zio Patch Extended out patient EKG monitoring 5 days starting 04/25/2022: Patch Wear Time:  5 days and 7 hours    Patient had a min HR of 49 bpm, max HR of 170 bpm, and avg HR of 84 bpm.  Predominant underlying rhythm was Sinus Rhythm.  Rare PACs and PVCs. No atrial fibrillation. No heart block. Short Ventricular Bigeminy and Trigeminy were present, no symptoms reported. Two patient triggered events during normal sinus rhythm.    Reviewed external labs and tests, independently interpreted   EKG 04/21/2022: NSR, HR 61   Renal artery duplex  09/25/2022: No evidence of renal artery occlusive disease in either renal artery. Normal intrarenal vascular perfusion is noted in both kidneys. Renal length is within normal limits for both kidneys. Normal abdominal aorta flow velocities noted. No significant plaque burden.      Review of Systems  Constitutional: Negative.  Eyes: Negative.   Cardiovascular:  Negative for palpitations.  Respiratory: Negative.    Musculoskeletal: Negative.   Gastrointestinal: Negative.   All other systems reviewed and are negative.        Vitals:   10/07/22 0929  BP: (!) 152/85  Pulse: 67  SpO2: 98%     Body mass index is 43.5 kg/m. Filed Weights   10/07/22 0929  Weight: 294 lb 9.6 oz (133.6 kg)     Objective:   Physical Exam Constitutional:      General: She is not in acute distress.    Appearance: Normal appearance.  HENT:     Head: Normocephalic and atraumatic.  Neck:     Vascular: No carotid bruit.  Cardiovascular:     Rate and Rhythm: Normal rate and regular rhythm.     Pulses: Normal pulses.     Heart sounds: Normal heart sounds. No murmur heard.    No friction rub. No gallop.  Pulmonary:     Effort: Pulmonary effort is normal.     Breath sounds: Normal breath sounds.  Abdominal:     General: Bowel sounds are normal.     Palpations: Abdomen  is soft.  Musculoskeletal:     Right lower leg: No edema.     Left lower leg: No edema.  Skin:    General: Skin is warm and dry.  Neurological:     General: No focal deficit present.     Mental Status: She is alert.  Psychiatric:        Mood and Affect: Mood normal.           Visit  diagnoses:   ICD-10-CM   1. Essential hypertension  I10     2. Palpitations  R00.2     3. Hyperlipemia, mixed  E78.2        No orders of the defined types were placed in this encounter.    Medication changes this visit: Medications Discontinued During This Encounter  Medication Reason   benzonatate (TESSALON) 100 MG capsule Completed Course   losartan (COZAAR) 50 MG tablet Reorder    Meds ordered this encounter  Medications   losartan (COZAAR) 50 MG tablet    Sig: Take 1 tablet (50 mg total) by mouth daily.    Dispense:  180 tablet    Refill:  3   Semaglutide,0.25 or 0.5MG /DOS, (OZEMPIC, 0.25 OR 0.5 MG/DOSE,) 2 MG/1.5ML SOPN    Sig: Inject 2 mg into the skin once a week.    Dispense:  1 mL    Refill:  6     Assessment & Recommendations:    Patient is a 37yo female with palpitations and hypertension  Essential hypertension Continue current cardiac medications. BP still uncontrolled but patient not tolerating higher dose of Losartan. Hopefully additional weight loss will provide added BP control Renin/aldo ratio and renal u/s both normal Encourage low-sodium diet, less than 2000 mg daily.   Palpitations Now resolved on metoprolol   Hyperlipemia, mixed Continue Crestor Will start Ozempic for weight loss, sample provided and instructions reviewed She will follow-up with me in about 1 month so check her results on [place], DO Office: [phone]

## 2022-10-07 NOTE — Progress Notes (Signed)
We are sorry that you are not feeling well.  Here is how we plan to help!  Based on what you have shared with me it does look like you have a viral infection.    Most cold sores or fever blisters are small fluid filled blisters around the mouth caused by herpes simplex virus.  The most common strain of the virus causing cold sores is herpes simplex virus 1.  It can be spread by skin contact, sharing eating utensils, or even sharing towels.  Cold sores are contagious to other people until dry. (Approximately 5-7 days).  Wash your hands. You can spread the virus to your eyes through handling your contact lenses after touching the lesions.  Most people experience pain at the sight or tingling sensations in their lips that may begin before the ulcers erupt.  Herpes simplex is treatable but not curable.  It may lie dormant for a long time and then reappear due to stress or prolonged sun exposure.  Many patients have success in treating their cold sores with an over the counter topical called Abreva.  You may apply the cream up to 5 times daily (maximum 10 days) until healing occurs.  If you would like to use an oral antiviral medication to speed the healing of your cold sore, I have sent a prescription to your local pharmacy Valacyclovir 2 gm take one by mouth twice a day for 1 day    HOME CARE:  Wash your hands frequently. Do not pick at or rub the sore. Don't open the blisters. Avoid kissing other people during this time. Avoid sharing drinking glasses, eating utensils, or razors. Do not handle contact lenses unless you have thoroughly washed your hands with soap and warm water! Avoid oral sex during this time.  Herpes from sores on your mouth can spread to your partner's genital area. Avoid contact with anyone who has eczema or a weakened immune system. Cold sores are often triggered by exposure to intense sunlight, use a lip balm containing a sunscreen (SPF 30 or higher).  GET HELP RIGHT AWAY  IF:  Blisters look infected. Blisters occur near or in the eye. Symptoms last longer than 10 days. Your symptoms become worse.  MAKE SURE YOU:  Understand these instructions. Will watch your condition. Will get help right away if you are not doing well or get worse.    Your e-visit answers were reviewed by a board certified advanced clinical practitioner to complete your personal care plan.  Depending upon the condition, your plan could have  Included both over the counter or prescription medications.    Please review your pharmacy choice.  Be sure that the pharmacy you have chosen is open so that you can pick up your prescription now.  If there is a problem you can message your provider in MyChart to have the prescription routed to another pharmacy.    Your safety is important to us.  If you have drug allergies check our prescription carefully.  For the next 24 hours you can use MyChart to ask questions about today's visit, request a non-urgent call back, or ask for a work or school excuse from your e-visit provider.  You will get an email in the next two days asking about your experience.  I hope that your e-visit has been valuable and will speed your recovery.  I provided 5 minutes of non face-to-face time during this encounter for chart review, medication and order placement, as well as and documentation.   

## 2022-10-09 NOTE — Progress Notes (Signed)
Chief Complaint:   OBESITY Leslie Branch is here to discuss her progress with her obesity treatment plan along with follow-up of her obesity related diagnoses. Leslie Branch is on keeping a food journal and adhering to recommended goals of 2000 calories and 100 grams of protein and states she is following her eating plan approximately 75% of the time. Leslie Branch states she is running 40 minutes 3 times per week.  Today's visit was #: 14 Starting weight: 284 lbs Starting date: 11/20/2021 Today's weight: 288 lbs Today's date: 10/02/2022 Total lbs lost to date: 0 lbs Total lbs lost since last in-office visit: 1  Interim History: Leslie Branch has a half marathon coming up this Saturday.  Drinking water and one energy drink daily.  Calories 2000, protein 60-124grams.  Weighing and measuring food.  Subjective:   1. Essential hypertension Leslie Branch is seeing cardiology next week for a follow up.  Denies any chest pain,shortness of breath or LEE.  Notes some palpitations last week.    2. SOB (shortness of breath) on exertion Leslie Branch's last IC was 2664 on 11/20/21.  Today's IC was 3220.  Assessment/Plan:   1. Essential hypertension Keep appointment with cardiology.  Stop Energy drinks/caffeine.  2. SOB (shortness of breath) on exertion Worsening.  Continue working on protein, water intake and exercise.  3. Obesity, Current BMI 42.6 Start Zepbound 2.5 mg SQ once a week for 1 month with 0 refills.  Side effects discussed.   Contraindications: patient denies  Pancreatitis (active gallstones) Medullary thyroid cancer High triglycerides (>500)-will need labs prior to starting Multiple Endocrine Neoplasia syndrome type 2 (MEN 2) Trying to get pregnant Breastfeeding Use with caution with taking insulin or sulfonylureas (will need to monitor blood sugars for hypoglycemia)  Avoid Phentermine; Qsymia and Contrave due to uncontrolled HTN and palpitations.  -Start tirzepatide (ZEPBOUND) 2.5 MG/0.5ML Pen; Inject 2.5 mg  into the skin once a week.  Dispense: 2 mL; Refill: 0  Leslie Branch is currently in the action stage of change. As such, her goal is to continue with weight loss efforts. She has agreed to the Category 4 Plan and keeping a food journal and adhering to recommended goals of 1900 calories and 90+ grams of protein.   Exercise goals: As is.  Behavioral modification strategies: increasing lean protein intake, increasing vegetables, and increasing water intake.  Leslie Branch has agreed to follow-up with our clinic in 4 weeks. She was informed of the importance of frequent follow-up visits to maximize her success with intensive lifestyle modifications for her multiple health conditions.   Objective:   Blood pressure (!) 158/83, pulse 64, temperature 97.8 F (36.6 C), height 5\' 9"  (1.753 m), weight 288 lb (130.6 kg), SpO2 99 %, unknown if currently breastfeeding. Body mass index is 42.53 kg/m.  General: Cooperative, alert, well developed, in no acute distress. HEENT: Conjunctivae and lids unremarkable. Cardiovascular: Regular rhythm.  Lungs: Normal work of breathing. Neurologic: No focal deficits.   Lab Results  Component Value Date   CREATININE 0.72 07/30/2022   BUN 11 07/30/2022   NA 141 07/30/2022   K 4.1 07/30/2022   CL 101 07/30/2022   CO2 21 07/30/2022   Lab Results  Component Value Date   ALT 17 07/30/2022   AST 18 07/30/2022   ALKPHOS 71 07/30/2022   BILITOT 0.7 07/30/2022   Lab Results  Component Value Date   HGBA1C 5.8 (H) 07/30/2022   HGBA1C 5.7 (H) 03/24/2022   HGBA1C 5.8 (H) 04/17/2021   Lab Results  Component Value  Date   INSULIN 22.5 07/30/2022   INSULIN 19.7 03/24/2022   INSULIN 17.3 11/20/2021   Lab Results  Component Value Date   TSH 1.560 07/30/2022   Lab Results  Component Value Date   CHOL 202 (H) 07/30/2022   HDL 45 07/30/2022   LDLCALC 136 (H) 07/30/2022   TRIG 119 07/30/2022   Lab Results  Component Value Date   VD25OH 27.3 (L) 07/30/2022   VD25OH  27.9 (L) 03/24/2022   Lab Results  Component Value Date   WBC 10.2 07/30/2022   HGB 14.1 07/30/2022   HCT 42.5 07/30/2022   MCV 85 07/30/2022   PLT 317 07/30/2022   Lab Results  Component Value Date   IRON 61 03/24/2022   TIBC 428 08/09/2018   FERRITIN 30 08/09/2018   Attestation Statements:   Reviewed by clinician on day of visit: allergies, medications, problem list, medical history, surgical history, family history, social history, and previous encounter notes.  I, Brendell Tyus, RMA, am acting as transcriptionist for Everardo Pacific, FNP.  I have reviewed the above documentation for accuracy and completeness, and I agree with the above. Everardo Pacific, FNP

## 2022-10-10 ENCOUNTER — Other Ambulatory Visit: Payer: Self-pay | Admitting: Nurse Practitioner

## 2022-10-10 DIAGNOSIS — E559 Vitamin D deficiency, unspecified: Secondary | ICD-10-CM

## 2022-10-13 NOTE — Patient Instructions (Signed)

## 2022-10-14 ENCOUNTER — Other Ambulatory Visit: Payer: Self-pay | Admitting: Internal Medicine

## 2022-10-14 MED ORDER — OZEMPIC (2 MG/DOSE) 8 MG/3ML ~~LOC~~ SOPN: 2.0000 mg | PEN_INJECTOR | SUBCUTANEOUS | 3 refills | Status: DC

## 2022-10-20 ENCOUNTER — Other Ambulatory Visit (INDEPENDENT_AMBULATORY_CARE_PROVIDER_SITE_OTHER): Payer: Self-pay | Admitting: Family Medicine

## 2022-10-20 DIAGNOSIS — I10 Essential (primary) hypertension: Secondary | ICD-10-CM

## 2022-10-28 ENCOUNTER — Telehealth: Payer: Self-pay

## 2022-10-28 NOTE — Telephone Encounter (Signed)
Thank you for creating a prior authorization request using [employer] for Zepbound The prior authorization department will contact you if additional information is needed. Once a decision is made you will be notified of the decision. You can check the status of your request using the Check Status link. Please note down (Prior Auth EOC ID) to check status. Prior Authorization request details: Prior Auth (EOC) ID: [id number] Drug/Service Name: ZEPBOUND 2.5 MG/0.5 ML PEN Patient: Leslie Branch Date Requested: 10/28/2022 11:17:46 AM   MemberID: [id number] DOB: [birth year]

## 2022-10-29 NOTE — Telephone Encounter (Signed)
Mychart message sent to patient.

## 2022-10-29 NOTE — Telephone Encounter (Signed)
Received fax from [facility]. Zepbound has been denied due to the members plan benefit does not provide coverage for this medication.

## 2022-11-04 ENCOUNTER — Encounter: Payer: Self-pay | Admitting: Nurse Practitioner

## 2022-11-04 ENCOUNTER — Ambulatory Visit (INDEPENDENT_AMBULATORY_CARE_PROVIDER_SITE_OTHER): Payer: No Typology Code available for payment source | Admitting: Nurse Practitioner

## 2022-11-04 VITALS — BP 155/71 | HR 62 | Temp 98.2°F | Ht 69.0 in | Wt 285.0 lb

## 2022-11-04 DIAGNOSIS — Z6841 Body Mass Index (BMI) 40.0 and over, adult: Secondary | ICD-10-CM | POA: Diagnosis not present

## 2022-11-04 DIAGNOSIS — I10 Essential (primary) hypertension: Secondary | ICD-10-CM

## 2022-11-04 MED ORDER — METOPROLOL SUCCINATE ER 25 MG PO TB24: 25.0000 mg | ORAL_TABLET | Freq: Every day | ORAL | 0 refills | Status: DC

## 2022-11-04 NOTE — Patient Instructions (Signed)

## 2022-11-04 NOTE — Progress Notes (Signed)
Office: [phone]  /  Fax: [phone]  WEIGHT SUMMARY AND BIOMETRICS  Medical Weight Loss Height: 5' 9"$  (1.753 m) Weight: 285 lb (129.3 kg) Temp: 98.2 F (36.8 C) Pulse Rate: 62 BP: (!) 155/71 SpO2: 99 % Fasting: Yes Today's Visit #: 15 Weight at Last VIsit: 288lb Weight Lost Since Last Visit: 3lb  Body Fat %: 45.1 % Fat Mass (lbs): 128.8 lbs Muscle Mass (lbs): 148.8 lbs Total Body Water (lbs): 101.8 lbs Visceral Fat Rating : 12 Starting Date: 11/30/21 Starting Weight: 284lb Total Weight Loss (lbs): 0 lb (0 kg)    HPI  Chief Complaint: OBESITY  Leslie Branch is here to discuss her progress with her obesity treatment plan. She is on the keeping a food journal and adhering to recommended goals of 1900 calories and 90+ protein and states she is following her eating plan approximately 90 % of the time. She states she is exercising 60 minutes 3-5 days per week.   Interval History:  Since last office visit she has lost 3 lbs.  She is eating around 1900 calories and 75 grams of protein daily.  She is trying to eat more protein and plans to start a protein shake.  She has been limiting her sweets.  She is running 3 days per week and going to the gym 2 days per week.    She started Ozempic 0.60m (today first injection of 0.568m prescribed by her PCP.  She completed 0.2530m 4 weeks.  Reports some side effects of constipation.    Hypertension Hypertension needs improvement.  Medication(s): She is taking Cozaar 13m51md needs a refill on Toprol XL 25mg57mies chest pain, shortness of breath or palpitations.    BP Readings from Last 3 Encounters:  11/04/22 (!) 155/71  10/07/22 (!) 152/85  10/02/22 (!) 158/83   Lab Results  Component Value Date   CREATININE 0.72 07/30/2022   CREATININE 0.68 03/24/2022   CREATININE 0.76 03/15/2021   No results found for: "GFR"   PHYSICAL EXAM:  Blood pressure (!) 155/71, pulse 62, temperature 98.2 F (36.8 C), height 5' 9"$  (1.753 m),  weight 285 lb (129.3 kg), SpO2 99 %, unknown if currently breastfeeding. Body mass index is 42.09 kg/m.  General: She is overweight, cooperative, alert, well developed, and in no acute distress. PSYCH: Has normal mood, affect and thought process.   Extremities: No edema.  Neurologic: No gross sensory or motor deficits. No tremors or fasciculations noted.    DIAGNOSTIC DATA REVIEWED:  BMET    Component Value Date/Time   NA 141 07/30/2022 1138   NA 140 08/21/2016 1328   K 4.1 07/30/2022 1138   K 4.0 08/21/2016 1328   CL 101 07/30/2022 1138   CO2 21 07/30/2022 1138   CO2 23 08/21/2016 1328   GLUCOSE 96 07/30/2022 1138   GLUCOSE 113 (H) 07/21/2019 0947   GLUCOSE 92 08/21/2016 1328   BUN 11 07/30/2022 1138   BUN 11.7 08/21/2016 1328   CREATININE 0.72 07/30/2022 1138   CREATININE 0.65 11/01/2018 0830   CREATININE 0.7 08/21/2016 1328   CALCIUM 9.4 07/30/2022 1138   CALCIUM 8.7 08/21/2016 1328   GFRNONAA >60 07/21/2019 0947   GFRNONAA >60 11/01/2018 0830   GFRAA >60 07/21/2019 0947   GFRAA >60 11/01/2018 0830   Lab Results  Component Value Date   HGBA1C 5.8 (H) 07/30/2022   HGBA1C 5.8 (H) 04/17/2021   Lab Results  Component Value Date   INSULIN 22.5 07/30/2022   INSULIN 17.3 11/20/2021  Lab Results  Component Value Date   TSH 1.560 07/30/2022   CBC    Component Value Date/Time   WBC 10.2 07/30/2022 1138   WBC 9.2 07/21/2019 0947   RBC 5.01 07/30/2022 1138   RBC 4.37 07/21/2019 0947   HGB 14.1 07/30/2022 1138   HGB 7.0 (L) 08/21/2016 1328   HCT 42.5 07/30/2022 1138   HCT 22.5 (L) 08/21/2016 1328   PLT 317 07/30/2022 1138   MCV 85 07/30/2022 1138   MCV 74.8 (L) 08/21/2016 1328   MCH 28.1 07/30/2022 1138   MCH 24.3 (L) 07/21/2019 0947   MCHC 33.2 07/30/2022 1138   MCHC 30.5 07/21/2019 0947   RDW 13.5 07/30/2022 1138   RDW 14.2 08/21/2016 1328   Iron Studies    Component Value Date/Time   IRON 61 03/24/2022 1021   IRON 14 (L) 08/21/2016 1328   TIBC 428  08/09/2018 0943   TIBC 459 (H) 08/21/2016 1328   FERRITIN 30 08/09/2018 0943   FERRITIN 10 08/21/2016 1328   IRONPCTSAT 17 (L) 08/09/2018 0943   IRONPCTSAT 3 (L) 08/21/2016 1328   Lipid Panel     Component Value Date/Time   CHOL 202 (H) 07/30/2022 1138   TRIG 119 07/30/2022 1138   HDL 45 07/30/2022 1138   LDLCALC 136 (H) 07/30/2022 1138   Hepatic Function Panel     Component Value Date/Time   PROT 7.0 07/30/2022 1138   PROT 6.7 08/21/2016 1328   ALBUMIN 4.7 07/30/2022 1138   ALBUMIN 3.6 08/21/2016 1328   AST 18 07/30/2022 1138   AST 10 (L) 11/01/2018 0830   AST 60 (H) 08/21/2016 1328   ALT 17 07/30/2022 1138   ALT 11 11/01/2018 0830   ALT 87 (H) 08/21/2016 1328   ALKPHOS 71 07/30/2022 1138   ALKPHOS 70 08/21/2016 1328   BILITOT 0.7 07/30/2022 1138   BILITOT 0.2 (L) 11/01/2018 0830   BILITOT 0.54 08/21/2016 1328   BILIDIR <0.1 07/21/2019 0947   IBILI NOT CALCULATED 07/21/2019 0947      Component Value Date/Time   TSH 1.560 07/30/2022 1138   Nutritional Lab Results  Component Value Date   VD25OH 27.3 (L) 07/30/2022   VD25OH 27.9 (L) 03/24/2022     ASSESSMENT AND PLAN  TREATMENT PLAN FOR OBESITY:  Recommended Dietary Goals  Leslie Branch is currently in the action stage of change. As such, her goal is to continue weight management plan. She has agreed to keeping a food journal and adhering to recommended goals of 1900 calories and 90+ protein.  Will adjust calories based upon her running schedule/mileage    Behavioral Intervention  We discussed the following Behavioral Modification Strategies today: increasing lean protein intake, increasing vegetables, increase water intake, work on meal planning and easy cooking plans, and think about ways to increase physical activity.  Additional resources provided today: NA  Recommended Physical Activity Goals  Leslie Branch has been advised to work up to 150 minutes of moderate intensity aerobic activity a week and strengthening  exercises 2-3 times per week for cardiovascular health, weight loss maintenance and preservation of muscle mass.   She has agreed to continue physical activity as is.   ASSOCIATED CONDITIONS ADDRESSED TODAY  Action/Plan  Essential hypertension -     Metoprolol Succinate ER; Take 1 tablet (25 mg total) by mouth daily.  Dispense: 30 tablet; Refill: 0.  Side effects discussed  Leslie Branch is working on healthy weight loss and exercise to improve blood pressure control. We will watch for signs  of hypotension as she continues her lifestyle modifications.   Morbid obesity ([place])  BMI 40.0-44.9, adult ([place])      Return in about 4 weeks (around 12/02/2022).Marland Kitchen She was informed of the importance of frequent follow up visits to maximize her success with intensive lifestyle modifications for her multiple health conditions.   ATTESTASTION STATEMENTS:  Reviewed by clinician on day of visit: allergies, medications, problem list, medical history, surgical history, family history, social history, and previous encounter notes.     Leslie Branch. Cassady Turano FNP-C

## 2022-11-05 ENCOUNTER — Encounter: Payer: Self-pay | Admitting: Nurse Practitioner

## 2022-11-06 ENCOUNTER — Encounter: Payer: Self-pay | Admitting: Internal Medicine

## 2022-11-06 ENCOUNTER — Ambulatory Visit: Payer: No Typology Code available for payment source | Admitting: Internal Medicine

## 2022-11-06 VITALS — BP 155/80 | HR 67 | Ht 69.0 in | Wt 290.0 lb

## 2022-11-06 DIAGNOSIS — I10 Essential (primary) hypertension: Secondary | ICD-10-CM

## 2022-11-06 DIAGNOSIS — E782 Mixed hyperlipidemia: Secondary | ICD-10-CM

## 2022-11-06 DIAGNOSIS — R002 Palpitations: Secondary | ICD-10-CM

## 2022-11-06 NOTE — Progress Notes (Signed)
Patient referred by No ref. provider found for palpitations  Subjective:   Leslie Branch, female    DOB: May 20, 1986, 37 y.o.   MRN: [id number]      Hypertension Pertinent negatives include no palpitations.    37 y.o.  female with palpitations here to for a follow-up visit. She is doing pretty well with Ozempic and she has lost a few pounds already. Patient is not experiencing any side effects with this. Otherwise, patient has been doing well. Her palpitations have completely resolved with metoprolol. She denies chest pain, SOB, dyspnea on exertion, diaphoresis, syncope, PND, and edema.    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   Termination of pregnancy (fetus)    x 3 in clinics     Past Surgical History:  Procedure Laterality Date   COLONOSCOPY     DILATATION & CURRETTAGE/HYSTEROSCOPY WITH RESECTOCOPE N/A 07/28/2019   Procedure: DILATATION & CURETTAGE/HYSTEROSCOPY WITH RESECTOCOPE;  Surgeon: Sanjuana Kava, MD;  Location: [facility];  Service: Gynecology;  Laterality: N/A;   DILATION AND EVACUATION N/A 07/22/2017   Procedure: DILATATION AND EVACUATION WITH GENETICS;  Surgeon: Tyson Dense, MD;  Location: [facility];  Service: Gynecology;  Laterality: N/A;   HAND SURGERY Left    pinky - reconstructive   KNEE ARTHROSCOPY Left    TONSILLECTOMY AND ADENOIDECTOMY     as a child   tubes in ears     as a child   UPPER GI ENDOSCOPY       Social History   Tobacco Use  Smoking Status Former   Packs/day: 1.00   Years: 10.00   Total pack years: 10.00   Types: Cigarettes   Quit date: 09/15/2010   Years since quitting: 12.1  Smokeless Tobacco Never    Social History   Substance and Sexual Activity  Alcohol Use Yes   Alcohol/week: 1.0 standard  drink of alcohol   Types: 1 Cans of beer per week   Comment: occassional     Family History  Problem Relation Age of Onset   Hypertension Mother    Hyperlipidemia Mother    Sleep apnea Mother    Diabetes Father    Prostate cancer Father    High blood pressure Father    High Cholesterol Father    Drug abuse Father    Diabetes Sister       Current Outpatient Medications:    levonorgestrel (MIRENA, 52 MG,) 20 MCG/DAY IUD, 1 each by Intrauterine route once., Disp: , Rfl:    losartan (COZAAR) 50 MG tablet, Take 1 tablet (50 mg total) by mouth daily., Disp: 180 tablet, Rfl: 3   Semaglutide, 2 MG/DOSE, (OZEMPIC, 2 MG/DOSE,) 8 MG/3ML SOPN, Inject 2 mg into the skin once a week., Disp: 3 mL, Rfl: 3   Vitamin D, Ergocalciferol, (DRISDOL) 1.25 MG (50000 UNIT) CAPS capsule, Take 1 capsule (50,000 Units total) by mouth every 7 (seven) days., Disp: 5 capsule, Rfl: 0   metoprolol succinate (TOPROL XL) 25 MG 24 hr tablet, Take 1 tablet (25 mg total) by mouth daily. (Patient not taking: Reported on 11/06/2022), Disp: 30 tablet, Rfl: 0   Cardiovascular and other pertinent studies:  Echocardiogram 05/28/2022:  Normal LV systolic function with visual EF 55-60%. Left ventricle cavity  is normal in size. Normal left ventricular wall thickness. Normal global  wall motion. Normal diastolic filling pattern, normal LAP. Calculated EF  56%.  Left atrial cavity is mildly dilated at 40.3 ml/m^2.  Structurally normal tricuspid valve.  Mild tricuspid regurgitation. No  evidence of pulmonary hypertension.  no prior available for comparison    Zio Patch Extended out patient EKG monitoring 5 days starting 04/25/2022: Patch Wear Time:  5 days and 7 hours    Patient had a min HR of 49 bpm, max HR of 170 bpm, and avg HR of 84 bpm.  Predominant underlying rhythm was Sinus Rhythm.  Rare PACs and PVCs. No atrial fibrillation. No heart block. Short Ventricular Bigeminy and Trigeminy were present, no symptoms  reported. Two patient triggered events during normal sinus rhythm.    Reviewed external labs and tests, independently interpreted   EKG 04/21/2022: NSR, HR 61   Renal artery duplex  09/25/2022: No evidence of renal artery occlusive disease in either renal artery. Normal intrarenal vascular perfusion is noted in both kidneys. Renal length is within normal limits for both kidneys. Normal abdominal aorta flow velocities noted. No significant plaque burden.      Review of Systems  Constitutional: Negative.  Eyes: Negative.   Cardiovascular:  Negative for palpitations.  Respiratory: Negative.    Musculoskeletal: Negative.   Gastrointestinal: Negative.   All other systems reviewed and are negative.        Vitals:   11/06/22 1003  BP: (!) 155/80  Pulse: 67  SpO2: 98%     Body mass index is 42.83 kg/m. Filed Weights   11/06/22 1003  Weight: 290 lb (131.5 kg)     Objective:   Physical Exam Constitutional:      General: She is not in acute distress.    Appearance: Normal appearance.  HENT:     Head: Normocephalic and atraumatic.  Neck:     Vascular: No carotid bruit.  Cardiovascular:     Rate and Rhythm: Normal rate and regular rhythm.     Pulses: Normal pulses.     Heart sounds: Normal heart sounds. No murmur heard.    No friction rub. No gallop.  Pulmonary:     Effort: Pulmonary effort is normal.     Breath sounds: Normal breath sounds.  Abdominal:     General: Bowel sounds are normal.     Palpations: Abdomen is soft.  Musculoskeletal:     Right lower leg: No edema.     Left lower leg: No edema.  Skin:    General: Skin is warm and dry.  Neurological:     General: No focal deficit present.     Mental Status: She is alert.  Psychiatric:        Mood and Affect: Mood normal.           Visit diagnoses:   ICD-10-CM   1. Essential hypertension  I10 EKG 12-Lead    2. Palpitations  R00.2     3. Hyperlipemia, mixed  E78.2        Orders  Placed This Encounter  Procedures   EKG 12-Lead      Medication changes this visit: Medications Discontinued During This Encounter  Medication Reason   rosuvastatin (CRESTOR) 5 MG tablet Discontinued by provider    No orders of the defined types were placed in this encounter.    Assessment & Recommendations:  Patient is a 37yo female with palpitations and hypertension  Essential hypertension Continue current cardiac medications. BP labile but she has been monitoring at home and it has been pretty well controlled Renin/aldo ratio and renal u/s both normal Encourage low-sodium diet, less than 2000 mg daily.   Palpitations Resolved on metoprolol   Hyperlipemia, mixed Continue Crestor, tolerating without side effects Will start Ozempic for weight loss, sample provided and instructions reviewed She will follow-up with me in about 3 months        Floydene Flock, DO Office: [phone]

## 2022-12-04 ENCOUNTER — Encounter: Payer: Self-pay | Admitting: Internal Medicine

## 2022-12-04 ENCOUNTER — Other Ambulatory Visit: Payer: Self-pay | Admitting: Nurse Practitioner

## 2022-12-04 ENCOUNTER — Ambulatory Visit (INDEPENDENT_AMBULATORY_CARE_PROVIDER_SITE_OTHER): Payer: No Typology Code available for payment source | Admitting: Nurse Practitioner

## 2022-12-04 ENCOUNTER — Encounter: Payer: Self-pay | Admitting: Nurse Practitioner

## 2022-12-04 VITALS — BP 162/84 | HR 65 | Temp 98.2°F | Ht 69.0 in | Wt 283.0 lb

## 2022-12-04 DIAGNOSIS — Z6841 Body Mass Index (BMI) 40.0 and over, adult: Secondary | ICD-10-CM | POA: Diagnosis not present

## 2022-12-04 DIAGNOSIS — I10 Essential (primary) hypertension: Secondary | ICD-10-CM

## 2022-12-04 NOTE — Progress Notes (Signed)
Office: [phone]  /  Fax: [phone]  WEIGHT SUMMARY AND BIOMETRICS  Weight Lost Since Last Visit: 2lb  No data recorded  Vitals Temp: 98.2 F (36.8 C) BP: (!) 162/84 Pulse Rate: 65 SpO2: 97 %   Anthropometric Measurements Height: 5\' 9"  (1.753 m) Weight: 283 lb (128.4 kg) BMI (Calculated): 41.77 Weight at Last Visit: 285lb Weight Lost Since Last Visit: 2lb Starting Weight: 284lb Total Weight Loss (lbs): 1 lb (0.454 kg)   Body Composition  Body Fat %: 44.4 % Fat Mass (lbs): 126 lbs Muscle Mass (lbs): 149.6 lbs Total Body Water (lbs): 100.8 lbs Visceral Fat Rating : 12   Other Clinical Data Fasting: No Labs: 0 Today's Visit #: 16 Starting Date: 11/30/21     HPI  Chief Complaint: OBESITY  Leslie Branch is here to discuss her progress with her obesity treatment plan. She is on the keeping a food journal and adhering to recommended goals of 1900 calories and 90+ protein and states she is following her eating plan approximately 90 % of the time. She states she is exercising 60 minutes 3-5 days per week.   Interval History:  Since last office visit she has lost 2 pounds.  She is averaging around 1900 calories and focusing harder on protein intake.  She is substituting a protein shake for BF, protein for snack, lunch protein + veggies, snack: none and dinner protein + veggies.  She has done a 10 mile race since her last visit and is scheduled for a 10k in a week. She is doing a 55k in May. She is doing [place] marathon in Sept.  Denies hunger and cravings since starting Ozempic.    She is currently taking Ozempic 1mg .  Denies side effects.    Bariatric surgery:  Patient never had bariatric surgery.    Hypertension Hypertension poorly controlled.  Medication(s): Toprol XL. Was taking Cozaar and stopped after seeing Cardiology.   Denies chest pain, palpitations and SOB. FH:  mother, father, pgp  BP Readings from Last 3 Encounters:  12/04/22 (!) 162/84  11/06/22  (!) 155/80  11/04/22 (!) 155/71   Lab Results  Component Value Date   CREATININE 0.72 07/30/2022   CREATININE 0.68 03/24/2022   CREATININE 0.76 03/15/2021    PHYSICAL EXAM:  Blood pressure (!) 162/84, pulse 65, temperature 98.2 F (36.8 C), height 5\' 9"  (1.753 m), weight 283 lb (128.4 kg), SpO2 97 %, unknown if currently breastfeeding. Body mass index is 41.79 kg/m.  General: She is overweight, cooperative, alert, well developed, and in no acute distress. PSYCH: Has normal mood, affect and thought process.   Extremities: No edema.  Neurologic: No gross sensory or motor deficits. No tremors or fasciculations noted.    DIAGNOSTIC DATA REVIEWED:  BMET    Component Value Date/Time   NA 141 07/30/2022 1138   NA 140 08/21/2016 1328   K 4.1 07/30/2022 1138   K 4.0 08/21/2016 1328   CL 101 07/30/2022 1138   CO2 21 07/30/2022 1138   CO2 23 08/21/2016 1328   GLUCOSE 96 07/30/2022 1138   GLUCOSE 113 (H) 07/21/2019 0947   GLUCOSE 92 08/21/2016 1328   BUN 11 07/30/2022 1138   BUN 11.7 08/21/2016 1328   CREATININE 0.72 07/30/2022 1138   CREATININE 0.65 11/01/2018 0830   CREATININE 0.7 08/21/2016 1328   CALCIUM 9.4 07/30/2022 1138   CALCIUM 8.7 08/21/2016 1328   GFRNONAA >60 07/21/2019 [place] >60 11/01/2018 0830   GFRAA >60 07/21/2019 [id number]  GFRAA >60 11/01/2018 0830   Lab Results  Component Value Date   HGBA1C 5.8 (H) 07/30/2022   HGBA1C 5.8 (H) 04/17/2021   Lab Results  Component Value Date   INSULIN 22.5 07/30/2022   INSULIN 17.3 11/20/2021   Lab Results  Component Value Date   TSH 1.560 07/30/2022   CBC    Component Value Date/Time   WBC 10.2 07/30/2022 1138   WBC 9.2 07/21/2019 0947   RBC 5.01 07/30/2022 1138   RBC 4.37 07/21/2019 0947   HGB 14.1 07/30/2022 1138   HGB 7.0 (L) 08/21/2016 1328   HCT 42.5 07/30/2022 1138   HCT 22.5 (L) 08/21/2016 1328   PLT 317 07/30/2022 1138   MCV 85 07/30/2022 1138   MCV 74.8 (L) 08/21/2016 1328   MCH 28.1  07/30/2022 1138   MCH 24.3 (L) 07/21/2019 0947   MCHC 33.2 07/30/2022 1138   MCHC 30.5 07/21/2019 0947   RDW 13.5 07/30/2022 1138   RDW 14.2 08/21/2016 1328   Iron Studies    Component Value Date/Time   IRON 61 03/24/2022 1021   IRON 14 (L) 08/21/2016 1328   TIBC 428 08/09/2018 0943   TIBC 459 (H) 08/21/2016 1328   FERRITIN 30 08/09/2018 0943   FERRITIN 10 08/21/2016 1328   IRONPCTSAT 17 (L) 08/09/2018 0943   IRONPCTSAT 3 (L) 08/21/2016 1328   Lipid Panel     Component Value Date/Time   CHOL 202 (H) 07/30/2022 1138   TRIG 119 07/30/2022 1138   HDL 45 07/30/2022 1138   LDLCALC 136 (H) 07/30/2022 1138   Hepatic Function Panel     Component Value Date/Time   PROT 7.0 07/30/2022 1138   PROT 6.7 08/21/2016 1328   ALBUMIN 4.7 07/30/2022 1138   ALBUMIN 3.6 08/21/2016 1328   AST 18 07/30/2022 1138   AST 10 (L) 11/01/2018 0830   AST 60 (H) 08/21/2016 1328   ALT 17 07/30/2022 1138   ALT 11 11/01/2018 0830   ALT 87 (H) 08/21/2016 1328   ALKPHOS 71 07/30/2022 1138   ALKPHOS 70 08/21/2016 1328   BILITOT 0.7 07/30/2022 1138   BILITOT 0.2 (L) 11/01/2018 0830   BILITOT 0.54 08/21/2016 1328   BILIDIR <0.1 07/21/2019 0947   IBILI NOT CALCULATED 07/21/2019 0947      Component Value Date/Time   TSH 1.560 07/30/2022 1138   Nutritional Lab Results  Component Value Date   VD25OH 27.3 (L) 07/30/2022   VD25OH 27.9 (L) 03/24/2022     ASSESSMENT AND PLAN  TREATMENT PLAN FOR OBESITY:  Recommended Dietary Goals  Leslie Branch is currently in the action stage of change. As such, her goal is to continue weight management plan. She has agreed to keeping a food journal and adhering to recommended goals of 1900 calories and 90+ protein.  Behavioral Intervention  We discussed the following Behavioral Modification Strategies today: increasing lean protein intake, decreasing simple carbohydrates , increasing vegetables, avoiding skipping meals, increasing water intake, work on meal planning  and easy cooking plans, planning for success, and keeping healthy foods at home.  Additional resources provided today: NA  Recommended Physical Activity Goals  Leslie Branch has been advised to work up to 150 minutes of moderate intensity aerobic activity a week and strengthening exercises 2-3 times per week for cardiovascular health, weight loss maintenance and preservation of muscle mass.   She has agreed to Continue current level of physical activity    Pharmacotherapy We discussed various medication options to help Leslie Branch with her weight loss  efforts and we both agreed to continue Ozempic.  ASSOCIATED CONDITIONS ADDRESSED TODAY  Action/Plan  Essential hypertension I've asked her to reach out her cardiologist about her BP to discuss further treatment options.     Morbid obesity ([place])  BMI 40.0-44.9, adult [facility])      Patient to contact me weekly to discuss calories, protein and current miles she is running.     Return in about 4 weeks (around 01/01/2023).Marland Kitchen She was informed of the importance of frequent follow up visits to maximize her success with intensive lifestyle modifications for her multiple health conditions.   ATTESTASTION STATEMENTS:  Reviewed by clinician on day of visit: allergies, medications, problem list, medical history, surgical history, family history, social history, and previous encounter notes.   Time spent on visit including pre-visit chart review and post-visit care and charting was 30 minutes.    Ailene Rud. Williams Dietrick FNP-C

## 2022-12-04 NOTE — Telephone Encounter (Signed)
From patient.

## 2022-12-16 ENCOUNTER — Other Ambulatory Visit: Payer: Self-pay

## 2022-12-16 MED ORDER — OZEMPIC (2 MG/DOSE) 8 MG/3ML ~~LOC~~ SOPN: 2.0000 mg | PEN_INJECTOR | SUBCUTANEOUS | 3 refills | Status: DC

## 2022-12-19 ENCOUNTER — Telehealth: Payer: Self-pay

## 2022-12-19 ENCOUNTER — Other Ambulatory Visit: Payer: Self-pay

## 2022-12-19 MED ORDER — WEGOVY 0.25 MG/0.5ML ~~LOC~~ SOAJ: 0.2500 mg | SUBCUTANEOUS | 3 refills | Status: DC

## 2022-12-19 NOTE — Telephone Encounter (Signed)
done

## 2022-12-19 NOTE — Telephone Encounter (Signed)
Patient will not get approved for ozempic she has pre diabetes not diabetes can we switch to wegovy?

## 2022-12-23 ENCOUNTER — Other Ambulatory Visit: Payer: Self-pay | Admitting: Internal Medicine

## 2022-12-29 ENCOUNTER — Encounter: Payer: Self-pay | Admitting: *Deleted

## 2023-01-06 ENCOUNTER — Encounter: Payer: Self-pay | Admitting: Nurse Practitioner

## 2023-01-06 ENCOUNTER — Telehealth: Payer: Self-pay

## 2023-01-06 ENCOUNTER — Ambulatory Visit (INDEPENDENT_AMBULATORY_CARE_PROVIDER_SITE_OTHER): Payer: No Typology Code available for payment source | Admitting: Nurse Practitioner

## 2023-01-06 VITALS — BP 145/81 | HR 76 | Temp 97.8°F | Ht 69.0 in | Wt 282.0 lb

## 2023-01-06 DIAGNOSIS — E7849 Other hyperlipidemia: Secondary | ICD-10-CM

## 2023-01-06 DIAGNOSIS — Z6841 Body Mass Index (BMI) 40.0 and over, adult: Secondary | ICD-10-CM

## 2023-01-06 DIAGNOSIS — I10 Essential (primary) hypertension: Secondary | ICD-10-CM

## 2023-01-06 DIAGNOSIS — R7303 Prediabetes: Secondary | ICD-10-CM

## 2023-01-06 MED ORDER — BUPROPION HCL ER (SR) 150 MG PO TB12: 150.0000 mg | ORAL_TABLET | Freq: Every day | ORAL | 0 refills | Status: DC

## 2023-01-06 MED ORDER — WEGOVY 0.25 MG/0.5ML ~~LOC~~ SOAJ: 0.2500 mg | SUBCUTANEOUS | 3 refills | Status: DC

## 2023-01-06 NOTE — Patient Instructions (Signed)

## 2023-01-06 NOTE — Progress Notes (Signed)
Office: [phone]  /  Fax: [phone]  WEIGHT SUMMARY AND BIOMETRICS  Weight Lost Since Last Visit: 1lb  No data recorded  Vitals Temp: 97.8 F (36.6 C) BP: (!) 145/81 Pulse Rate: 76 SpO2: 96 %   Anthropometric Measurements Height:  (1.753 m) Weight: 282 lb (127.9 kg) BMI (Calculated): 41.63 Weight at Last Visit: 283lb Weight Lost Since Last Visit: 1lb Starting Weight: 284lb Total Weight Loss (lbs): 2 lb (0.907 kg)   Body Composition  Body Fat %: 44.1 % Fat Mass (lbs): 124.4 lbs Muscle Mass (lbs): 149.6 lbs Total Body Water (lbs): 99.8 lbs Visceral Fat Rating : 12   Other Clinical Data Fasting: No Today's Visit #: 17 Starting Date: 11/30/21     HPI  Chief Complaint: OBESITY  Leslie Branch Branch here to discuss Leslie Branch progress with Leslie Branch obesity treatment plan. Leslie Branch on the keeping a food journal and adhering to recommended goals of 1900 calories and 90+ protein and states Leslie Branch following Leslie Branch eating plan approximately 90 % of the time. Leslie Branch states Leslie Branch exercising 60 minutes 5 days per week.   Interval History:  Since last office visit Leslie Branch has lost 1 pound.  Leslie Branch running a 34 mile race May 19th.  Leslie Branch was taking Ozempic and was doing well.  However due to cost Leslie Branch not able to continue taking ti.  Leslie Branch struggling with cravings since stopping Ozempic.  Leslie Branch not skipping meals.  Leslie Branch aiming to eat protein with each meal.  Leslie Branch drinking a protein shake and water daily.     Pharmacotherapy for weight loss: Leslie Branch not currently taking medications  for medical weight loss.     Hypertension Hypertension needs improvement.  Medication(s): Cozaar . Stopped Toprol due to side effects of fatigue.  Leslie Branch saw cardiology last on 11/06/22.   Denies chest pain, palpitations and SOB.  BP Readings from Last 3 Encounters:  01/06/23 (!) 145/81  12/04/22 (!) 162/84  11/06/22 (!) 155/80   Lab Results  Component Value Date   CREATININE 0.72 07/30/2022    CREATININE 0.68 03/24/2022   CREATININE 0.76 03/15/2021     Hyperlipidemia Medication(s): Crestor . Denies side effects.   Lab Results  Component Value Date   CHOL 202 (H) 07/30/2022   HDL 45 07/30/2022   LDLCALC 136 (H) 07/30/2022   TRIG 119 07/30/2022   Lab Results  Component Value Date   ALT 17 07/30/2022   AST 18 07/30/2022   ALKPHOS 71 07/30/2022   BILITOT 0.7 07/30/2022   The ASCVD Risk score (Arnett DK, et al., 2019) failed to calculate for the following reasons:   The 2019 ASCVD risk score Branch only valid for ages 68 to 59   Prediabetes Last A1c was 5.8  Medication(s): not currently on meds Recently took Ozempic and did well but was unable to continue due to cost.  FH:  father and sister  Lab Results  Component Value Date   HGBA1C 5.8 (H) 07/30/2022   HGBA1C 5.7 (H) 03/24/2022   HGBA1C 5.8 (H) 04/17/2021   Lab Results  Component Value Date   INSULIN 22.5 07/30/2022   INSULIN 19.7 03/24/2022   INSULIN 17.3 11/20/2021     PHYSICAL EXAM:  Blood pressure (!) 145/81, pulse 76, temperature 97.8 F (36.6 C), height  (1.753 m), weight 282 lb (127.9 kg), SpO2 96 %, not currently breastfeeding. Body mass index Branch 41.64 kg/m.  General: Leslie Branch overweight, cooperative, alert, well developed,  and in no acute distress. PSYCH: Has normal mood, affect and thought process.   Extremities: No edema.  Neurologic: No gross sensory or motor deficits. No tremors or fasciculations noted.    DIAGNOSTIC DATA REVIEWED:  BMET    Component Value Date/Time   NA 141 07/30/2022 1138   NA 140 08/21/2016 1328   K 4.1 07/30/2022 1138   K 4.0 08/21/2016 1328   CL 101 07/30/2022 1138   CO2 21 07/30/2022 1138   CO2 23 08/21/2016 1328   GLUCOSE 96 07/30/2022 1138   GLUCOSE 113 (H) 07/21/2019 0947   GLUCOSE 92 08/21/2016 1328   BUN 11 07/30/2022 1138   BUN 11.7 08/21/2016 1328   CREATININE 0.72 07/30/2022 1138   CREATININE 0.65 11/01/2018 0830   CREATININE 0.7  08/21/2016 1328   CALCIUM 9.4 07/30/2022 1138   CALCIUM 8.7 08/21/2016 1328   GFRNONAA >60 07/21/2019 0947   GFRNONAA >60 11/01/2018 0830   GFRAA >60 07/21/2019 0947   GFRAA >60 11/01/2018 0830   Lab Results  Component Value Date   HGBA1C 5.8 (H) 07/30/2022   HGBA1C 5.8 (H) 04/17/2021   Lab Results  Component Value Date   INSULIN 22.5 07/30/2022   INSULIN 17.3 11/20/2021   Lab Results  Component Value Date   TSH 1.560 07/30/2022   CBC    Component Value Date/Time   WBC 10.2 07/30/2022 1138   WBC 9.2 07/21/2019 0947   RBC 5.01 07/30/2022 1138   RBC 4.37 07/21/2019 0947   HGB 14.1 07/30/2022 1138   HGB 7.0 (L) 08/21/2016 1328   HCT 42.5 07/30/2022 1138   HCT 22.5 (L) 08/21/2016 1328   PLT 317 07/30/2022 1138   MCV 85 07/30/2022 1138   MCV 74.8 (L) 08/21/2016 1328   MCH 28.1 07/30/2022 1138   MCH 24.3 (L) 07/21/2019 0947   MCHC 33.2 07/30/2022 1138   MCHC 30.5 07/21/2019 0947   RDW 13.5 07/30/2022 1138   RDW 14.2 08/21/2016 1328   Iron Studies    Component Value Date/Time   IRON 61 03/24/2022 1021   IRON 14 (L) 08/21/2016 1328   TIBC 428 08/09/2018 0943   TIBC 459 (H) 08/21/2016 1328   FERRITIN 30 08/09/2018 0943   FERRITIN 10 08/21/2016 1328   IRONPCTSAT 17 (L) 08/09/2018 0943   IRONPCTSAT 3 (L) 08/21/2016 1328   Lipid Panel     Component Value Date/Time   CHOL 202 (H) 07/30/2022 1138   TRIG 119 07/30/2022 1138   HDL 45 07/30/2022 1138   LDLCALC 136 (H) 07/30/2022 1138   Hepatic Function Panel     Component Value Date/Time   PROT 7.0 07/30/2022 1138   PROT 6.7 08/21/2016 1328   ALBUMIN 4.7 07/30/2022 1138   ALBUMIN 3.6 08/21/2016 1328   AST 18 07/30/2022 1138   AST 10 (L) 11/01/2018 0830   AST 60 (H) 08/21/2016 1328   ALT 17 07/30/2022 1138   ALT 11 11/01/2018 0830   ALT 87 (H) 08/21/2016 1328   ALKPHOS 71 07/30/2022 1138   ALKPHOS 70 08/21/2016 1328   BILITOT 0.7 07/30/2022 1138   BILITOT 0.2 (L) 11/01/2018 0830   BILITOT 0.54 08/21/2016  1328   BILIDIR <0.1 07/21/2019 0947   IBILI NOT CALCULATED 07/21/2019 0947      Component Value Date/Time   TSH 1.560 07/30/2022 1138   Nutritional Lab Results  Component Value Date   VD25OH 27.3 (L) 07/30/2022   VD25OH 27.9 (L) 03/24/2022     ASSESSMENT AND PLAN  TREATMENT PLAN FOR OBESITY:  Recommended Dietary Goals  Leslie Branch Branch currently in the action stage of change. As such, Leslie Branch Branch to continue weight management plan. Leslie Branch has agreed to keeping a food journal and adhering to recommended goals of 1900 calories and 100 protein.  Behavioral Intervention  We discussed the following Behavioral Modification Strategies today: increasing lean protein intake, decreasing simple carbohydrates , increasing vegetables, increasing lower glycemic fruits, increasing fiber rich foods, avoiding skipping meals, increasing water intake, continue to practice mindfulness when eating, and planning for success.  Additional resources provided today: NA  Recommended Physical Activity Goals  Leslie Branch has been advised to work up to 150 minutes of moderate intensity aerobic activity a week and strengthening exercises 2-3 times per week for cardiovascular health, weight loss maintenance and preservation of muscle mass.   Leslie Branch has agreed to Continue current level of physical activity    Pharmacotherapy We discussed various medication options to help Leslie Branch with Leslie Branch weight loss efforts and we both agreed to start [facility] 0.25mg   See SELECT study.  Leslie Branch would benefit from using Wegovy due to history of HTN, HLD, pre diabetes, palpitations.  Avoid Phentermine; Qsymia and Contrave due to uncontrolled HTN and palpitations.  Avoid Orlistat due to Vit D def.  Contraindications:  Pancreatitis (active gallstones) Medullary thyroid cancer High triglycerides (>500)-will need labs prior to starting Multiple Endocrine Neoplasia syndrome type 2 (MEN 2) Trying to get pregnant Breastfeeding Use with caution with  taking insulin or sulfonylureas (will need to monitor blood sugars for hypoglycemia)  ASSOCIATED CONDITIONS ADDRESSED TODAY  Action/Plan  Essential hypertension -     [id number]; Inject 0.25 mg into the skin once a week.  Dispense: 2 mL; Refill: 3  Continue to follow up with cardiology and PCP.  Continue meds as directed.   Other hyperlipidemia -     [id number]; Inject 0.25 mg into the skin once a week.  Dispense: 2 mL; Refill: 3  Continue to follow up with cardiology and PCP.  Continue meds as directed.   Pre-diabetes -     [id number]; Inject 0.25 mg into the skin once a week.  Dispense: 2 mL; Refill: 3  Leslie Branch will continue to work on weight loss, exercise, and decreasing simple carbohydrates to help decrease the risk of diabetes.    Morbid obesity -     EAVWUJ; Inject 0.25 mg into the skin once a week.  Dispense: 2 mL; Refill: 3 -     Start buPROPion HCl ER (SR); Take 1 tablet (150 mg total) by mouth daily.  Dispense: 30 tablet; Refill: 0 for cravings.  Side effects discussed.    BMI 40.0-44.9, adult -     [id number]; Inject 0.25 mg into the skin once a week.  Dispense: 2 mL; Refill: 3 -     buPROPion HCl ER (SR); Take 1 tablet (150 mg total) by mouth daily.  Dispense: 30 tablet; Refill: 0         Return in about 4 weeks (around 02/03/2023).Marland Kitchen Leslie Branch was informed of the importance of frequent follow up visits to maximize Leslie Branch success with intensive lifestyle modifications for Leslie Branch multiple health conditions.   ATTESTASTION STATEMENTS:  Reviewed by clinician on day of visit: allergies, medications, problem list, medical history, surgical history, family history, social history, and previous encounter notes.    Theodis Sato. Sherrell Weir FNP-C

## 2023-01-06 NOTE — Telephone Encounter (Signed)
PA submitted through [employer] for [employer]. Awaiting insurance determination. [id number]

## 2023-01-07 NOTE — Telephone Encounter (Signed)
Received fax from [employer] stating that Leslie Branch has been denied. "This members plan benefit does not provide benefit coverage for this medication."

## 2023-01-08 ENCOUNTER — Encounter: Payer: Self-pay | Admitting: Nurse Practitioner

## 2023-01-09 ENCOUNTER — Other Ambulatory Visit: Payer: Self-pay

## 2023-01-09 MED ORDER — WEGOVY 0.25 MG/0.5ML ~~LOC~~ SOAJ: 0.2500 mg | SUBCUTANEOUS | 3 refills | Status: DC

## 2023-01-09 NOTE — Telephone Encounter (Signed)
send wagovy she is pre-diabetic

## 2023-01-09 NOTE — Telephone Encounter (Signed)
Ozempic was denied  

## 2023-01-31 ENCOUNTER — Other Ambulatory Visit: Payer: Self-pay | Admitting: Nurse Practitioner

## 2023-01-31 DIAGNOSIS — Z6841 Body Mass Index (BMI) 40.0 and over, adult: Secondary | ICD-10-CM

## 2023-02-12 ENCOUNTER — Ambulatory Visit (INDEPENDENT_AMBULATORY_CARE_PROVIDER_SITE_OTHER): Payer: No Typology Code available for payment source | Admitting: Nurse Practitioner

## 2023-02-12 ENCOUNTER — Encounter: Payer: Self-pay | Admitting: Nurse Practitioner

## 2023-02-12 VITALS — BP 143/84 | HR 66 | Temp 97.5°F | Ht 69.0 in | Wt 279.0 lb

## 2023-02-12 DIAGNOSIS — E559 Vitamin D deficiency, unspecified: Secondary | ICD-10-CM | POA: Diagnosis not present

## 2023-02-12 DIAGNOSIS — R7303 Prediabetes: Secondary | ICD-10-CM | POA: Diagnosis not present

## 2023-02-12 DIAGNOSIS — I10 Essential (primary) hypertension: Secondary | ICD-10-CM

## 2023-02-12 DIAGNOSIS — E7849 Other hyperlipidemia: Secondary | ICD-10-CM

## 2023-02-12 DIAGNOSIS — Z6841 Body Mass Index (BMI) 40.0 and over, adult: Secondary | ICD-10-CM

## 2023-02-12 NOTE — Progress Notes (Signed)
Office: [phone]  /  Fax: [phone]  WEIGHT SUMMARY AND BIOMETRICS  Weight Lost Since Last Visit: 3lb  No data recorded  Vitals Temp: (!) 97.5 F (36.4 C) BP: (!) 143/84 Pulse Rate: 66 SpO2: 98 %   Anthropometric Measurements Height: 5\' 9"  (1.753 m) Weight: 279 lb (126.6 kg) BMI (Calculated): 41.18 Weight at Last Visit: 282lb Weight Lost Since Last Visit: 3lb Starting Weight: 284lb Total Weight Loss (lbs): 5 lb (2.268 kg)   Body Composition  Body Fat %: 44 % Fat Mass (lbs): 122.8 lbs Muscle Mass (lbs): 148.6 lbs Total Body Water (lbs): 101 lbs Visceral Fat Rating : 12   Other Clinical Data Fasting: No Labs: No Today's Visit #: 18 Starting Date: 11/30/21     HPI  Chief Complaint: OBESITY  Jenai is here to discuss her progress with her obesity treatment plan. She is on the keeping a food journal and adhering to recommended goals of 1900 calories and 90 protein and states she is following her eating plan approximately 90 % of the time. She states she is exercising 60 minutes 5 days per week.   Interval History:  Since last office visit she has lost 3 pounds.  She is not skipping meals.  She is trying to eat more protein with each meal.  Doing well with running and notes she is not as hungry especially with running since starting Wellbutrin.  She is planning to start training for [employer].  She is drinking water and a protein shake daily.     Pharmacotherapy for weight loss: She is currently taking Wellbutrin SR 150mg  for medical weight loss.  Reports side effects of occ nausea and reflux.  Helps with cravings for sweets and hunger.    Bariatric surgery:  Patient has not had bariatric surgery.    Vit D deficiency  She is not currently taking  Vit D.    Lab Results  Component Value Date   VD25OH 27.3 (L) 07/30/2022   VD25OH 27.9 (L) 03/24/2022    Prediabetes Last A1c was 5.8  Medication(s): she is not currently on meds.  She tried  Ozempic in the past and did well but wasn't able to continue due to cost.   Lab Results  Component Value Date   HGBA1C 5.8 (H) 07/30/2022   HGBA1C 5.7 (H) 03/24/2022   HGBA1C 5.8 (H) 04/17/2021   Lab Results  Component Value Date   INSULIN 22.5 07/30/2022   INSULIN 19.7 03/24/2022   INSULIN 17.3 11/20/2021   Hyperlipidemia Medication(s): Not currently on meds.  Took Crestor in the past. .    Lab Results  Component Value Date   CHOL 202 (H) 07/30/2022   HDL 45 07/30/2022   LDLCALC 136 (H) 07/30/2022   TRIG 119 07/30/2022   Lab Results  Component Value Date   ALT 17 07/30/2022   AST 18 07/30/2022   ALKPHOS 71 07/30/2022   BILITOT 0.7 07/30/2022   The ASCVD Risk score (Arnett DK, et al., 2019) failed to calculate for the following reasons:   The 2019 ASCVD risk score is only valid for ages 56 to 65   Hypertension Hypertension needs improvement.  Medication(s): Cozaar 50mg .  Denies side effects.  Denies chest pain, palpitations and SOB.  BP Readings from Last 3 Encounters:  02/12/23 (!) 143/84  01/06/23 (!) 145/81  12/04/22 (!) 162/84   Lab Results  Component Value Date   CREATININE 0.72 07/30/2022   CREATININE 0.68 03/24/2022   CREATININE 0.76 03/15/2021  PHYSICAL EXAM:  Blood pressure (!) 143/84, pulse 66, temperature (!) 97.5 F (36.4 C), height 5\' 9"  (1.753 m), weight 279 lb (126.6 kg), SpO2 98 %. Body mass index is 41.2 kg/m.  General: She is overweight, cooperative, alert, well developed, and in no acute distress. PSYCH: Has normal mood, affect and thought process.   Extremities: No edema.  Neurologic: No gross sensory or motor deficits. No tremors or fasciculations noted.    DIAGNOSTIC DATA REVIEWED:  BMET    Component Value Date/Time   NA 141 07/30/2022 1138   NA 140 08/21/2016 1328   K 4.1 07/30/2022 1138   K 4.0 08/21/2016 1328   CL 101 07/30/2022 1138   CO2 21 07/30/2022 1138   CO2 23 08/21/2016 1328   GLUCOSE 96 07/30/2022 1138    GLUCOSE 113 (H) 07/21/2019 0947   GLUCOSE 92 08/21/2016 1328   BUN 11 07/30/2022 1138   BUN 11.7 08/21/2016 1328   CREATININE 0.72 07/30/2022 1138   CREATININE 0.65 11/01/2018 0830   CREATININE 0.7 08/21/2016 1328   CALCIUM 9.4 07/30/2022 1138   CALCIUM 8.7 08/21/2016 1328   GFRNONAA >60 07/21/2019 0947   GFRNONAA >60 11/01/2018 0830   GFRAA >60 07/21/2019 0947   GFRAA >60 11/01/2018 0830   Lab Results  Component Value Date   HGBA1C 5.8 (H) 07/30/2022   HGBA1C 5.8 (H) 04/17/2021   Lab Results  Component Value Date   INSULIN 22.5 07/30/2022   INSULIN 17.3 11/20/2021   Lab Results  Component Value Date   TSH 1.560 07/30/2022   CBC    Component Value Date/Time   WBC 10.2 07/30/2022 1138   WBC 9.2 07/21/2019 0947   RBC 5.01 07/30/2022 1138   RBC 4.37 07/21/2019 0947   HGB 14.1 07/30/2022 1138   HGB 7.0 (L) 08/21/2016 1328   HCT 42.5 07/30/2022 1138   HCT 22.5 (L) 08/21/2016 1328   PLT 317 07/30/2022 1138   MCV 85 07/30/2022 1138   MCV 74.8 (L) 08/21/2016 1328   MCH 28.1 07/30/2022 1138   MCH 24.3 (L) 07/21/2019 0947   MCHC 33.2 07/30/2022 1138   MCHC 30.5 07/21/2019 0947   RDW 13.5 07/30/2022 1138   RDW 14.2 08/21/2016 1328   Iron Studies    Component Value Date/Time   IRON 61 03/24/2022 1021   IRON 14 (L) 08/21/2016 1328   TIBC 428 08/09/2018 0943   TIBC 459 (H) 08/21/2016 1328   FERRITIN 30 08/09/2018 0943   FERRITIN 10 08/21/2016 1328   IRONPCTSAT 17 (L) 08/09/2018 0943   IRONPCTSAT 3 (L) 08/21/2016 1328   Lipid Panel     Component Value Date/Time   CHOL 202 (H) 07/30/2022 1138   TRIG 119 07/30/2022 1138   HDL 45 07/30/2022 1138   LDLCALC 136 (H) 07/30/2022 1138   Hepatic Function Panel     Component Value Date/Time   PROT 7.0 07/30/2022 1138   PROT 6.7 08/21/2016 1328   ALBUMIN 4.7 07/30/2022 1138   ALBUMIN 3.6 08/21/2016 1328   AST 18 07/30/2022 1138   AST 10 (L) 11/01/2018 0830   AST 60 (H) 08/21/2016 1328   ALT 17 07/30/2022 1138    ALT 11 11/01/2018 0830   ALT 87 (H) 08/21/2016 1328   ALKPHOS 71 07/30/2022 1138   ALKPHOS 70 08/21/2016 1328   BILITOT 0.7 07/30/2022 1138   BILITOT 0.2 (L) 11/01/2018 0830   BILITOT 0.54 08/21/2016 1328   BILIDIR <0.1 07/21/2019 0947   IBILI NOT CALCULATED 07/21/2019  1610      Component Value Date/Time   TSH 1.560 07/30/2022 1138   Nutritional Lab Results  Component Value Date   VD25OH 27.3 (L) 07/30/2022   VD25OH 27.9 (L) 03/24/2022     ASSESSMENT AND PLAN  TREATMENT PLAN FOR OBESITY:  Recommended Dietary Goals  Angalina is currently in the action stage of change. As such, her goal is to continue weight management plan. She has agreed to keeping a food journal and adhering to recommended goals of 1900 calories and 90+ protein.  I've asked her to contact me every 1-2 weeks with calories and protein intake, miles running for each week and how she is feeling.   Behavioral Intervention  We discussed the following Behavioral Modification Strategies today: increasing lean protein intake, decreasing simple carbohydrates , increasing vegetables, increasing lower glycemic fruits, avoiding skipping meals, increasing water intake, work on tracking and journaling calories using tracking application, continue to practice mindfulness when eating, and planning for success.  Additional resources provided today: NA  Recommended Physical Activity Goals  Nazly has been advised to work up to 150 minutes of moderate intensity aerobic activity a week and strengthening exercises 2-3 times per week for cardiovascular health, weight loss maintenance and preservation of muscle mass.   She has agreed to Continue current level of physical activity   ASSOCIATED CONDITIONS ADDRESSED TODAY  Action/Plan  Pre-diabetes -     Comprehensive metabolic panel -     Hemoglobin A1c  Trenady will continue to work on weight loss, exercise, and decreasing simple carbohydrates to help decrease the risk of  diabetes.    Other hyperlipidemia -     Comprehensive metabolic panel -     Lipid Panel With LDL/HDL Ratio  Continue to follow up with PCP.  Continue meds as directed.    Vitamin D deficiency -     VITAMIN D 25 Hydroxy (Vit-D Deficiency, Fractures)  Low Vitamin D level contributes to fatigue and are associated with obesity, breast, and colon cancer. She agrees to continue to take prescription Vitamin D @50 ,000 IU every week and will follow-up for routine testing of Vitamin D, at least 2-3 times per year to avoid over-replacement.   Essential hypertension -     Comprehensive metabolic panel  Continue to follow up with PCP.  Continue meds as directed.  Would like better control.   Morbid obesity (HCC)  BMI 40.0-44.9, adult (HCC)         No follow-ups on file.Marland Kitchen She was informed of the importance of frequent follow up visits to maximize her success with intensive lifestyle modifications for her multiple health conditions.   ATTESTASTION STATEMENTS:  Reviewed by clinician on day of visit: allergies, medications, problem list, medical history, surgical history, family history, social history, and previous encounter notes.     Theodis Sato. Karnisha Lefebre FNP-C

## 2023-02-13 LAB — COMPREHENSIVE METABOLIC PANEL
ALT: 16 IU/L (ref 0–32)
AST: 16 IU/L (ref 0–40)
Albumin/Globulin Ratio: 1.9 (ref 1.2–2.2)
Albumin: 4.3 g/dL (ref 3.9–4.9)
Alkaline Phosphatase: 62 IU/L (ref 44–121)
BUN/Creatinine Ratio: 19 (ref 9–23)
BUN: 17 mg/dL (ref 6–20)
Bilirubin Total: 0.4 mg/dL (ref 0.0–1.2)
CO2: 22 mmol/L (ref 20–29)
Calcium: 9.1 mg/dL (ref 8.7–10.2)
Chloride: 102 mmol/L (ref 96–106)
Creatinine, Ser: 0.9 mg/dL (ref 0.57–1.00)
Globulin, Total: 2.3 g/dL (ref 1.5–4.5)
Glucose: 81 mg/dL (ref 70–99)
Potassium: 4.4 mmol/L (ref 3.5–5.2)
Sodium: 139 mmol/L (ref 134–144)
Total Protein: 6.6 g/dL (ref 6.0–8.5)
eGFR: 84 mL/min/{1.73_m2} (ref >59)

## 2023-02-13 LAB — LIPID PANEL WITH LDL/HDL RATIO
Cholesterol, Total: 167 mg/dL (ref 100–199)
HDL: 41 mg/dL (ref >39)
LDL Chol Calc (NIH): 100 mg/dL — ABNORMAL HIGH (ref 0–99)
LDL/HDL Ratio: 2.4 ratio (ref 0.0–3.2)
Triglycerides: 150 mg/dL — ABNORMAL HIGH (ref 0–149)
VLDL Cholesterol Cal: 26 mg/dL (ref 5–40)

## 2023-02-13 LAB — VITAMIN D 25 HYDROXY (VIT D DEFICIENCY, FRACTURES): Vit D, 25-Hydroxy: 29.7 ng/mL — ABNORMAL LOW (ref 30.0–100.0)

## 2023-02-13 LAB — HEMOGLOBIN A1C
Est. average glucose Bld gHb Est-mCnc: 111 mg/dL
Hgb A1c MFr Bld: 5.5 % (ref 4.8–5.6)

## 2023-02-17 ENCOUNTER — Other Ambulatory Visit: Payer: Self-pay | Admitting: Nurse Practitioner

## 2023-02-17 DIAGNOSIS — Z6841 Body Mass Index (BMI) 40.0 and over, adult: Secondary | ICD-10-CM

## 2023-03-02 ENCOUNTER — Encounter: Payer: Self-pay | Admitting: Nurse Practitioner

## 2023-03-23 ENCOUNTER — Ambulatory Visit (INDEPENDENT_AMBULATORY_CARE_PROVIDER_SITE_OTHER): Payer: No Typology Code available for payment source | Admitting: Nurse Practitioner

## 2023-03-23 ENCOUNTER — Encounter: Payer: Self-pay | Admitting: Nurse Practitioner

## 2023-03-23 VITALS — BP 140/80 | HR 68 | Temp 98.2°F | Ht 69.0 in | Wt 281.0 lb

## 2023-03-23 DIAGNOSIS — E7849 Other hyperlipidemia: Secondary | ICD-10-CM

## 2023-03-23 DIAGNOSIS — E559 Vitamin D deficiency, unspecified: Secondary | ICD-10-CM | POA: Diagnosis not present

## 2023-03-23 DIAGNOSIS — Z6841 Body Mass Index (BMI) 40.0 and over, adult: Secondary | ICD-10-CM

## 2023-03-23 DIAGNOSIS — R7303 Prediabetes: Secondary | ICD-10-CM | POA: Diagnosis not present

## 2023-03-23 MED ORDER — BUPROPION HCL ER (SR) 150 MG PO TB12: 150.0000 mg | ORAL_TABLET | Freq: Two times a day (BID) | ORAL | 0 refills | Status: DC

## 2023-03-23 NOTE — Progress Notes (Signed)
Office: [phone]  /  Fax: [phone]  WEIGHT SUMMARY AND BIOMETRICS  No data recorded Weight Gained Since Last Visit: 2lb   Vitals Temp: 98.2 F (36.8 C) BP: (!) 140/80 Pulse Rate: 68 SpO2: 98 %   Anthropometric Measurements Height: 5\' 9"  (1.753 m) Weight: 281 lb (127.5 kg) BMI (Calculated): 41.48 Weight at Last Visit: 279lb Weight Gained Since Last Visit: 2lb Starting Weight: 284lb Total Weight Loss (lbs): 3 lb (1.361 kg)   Body Composition  Body Fat %: 43.7 % Fat Mass (lbs): 122.8 lbs Muscle Mass (lbs): 150.2 lbs Total Body Water (lbs): 101.2 lbs Visceral Fat Rating : 12   Other Clinical Data Fasting: No Labs: No Today's Visit #: 19 Starting Date: 11/20/21     HPI  Chief Complaint: OBESITY  Leslie Branch is here to discuss her progress with her obesity treatment plan. She is on the keeping a food journal and adhering to recommended goals of 1900 calories and 90 protein and states she is following her eating plan approximately 75 % of the time. She states she is exercising 30-60 minutes 5 days per week.   Interval History:  Since last office visit she has gained 2 pounds. She is averaging around 1900 calories.  She is running on an average of 10-20 miles per week. Struggles with hunger and some stress eating. Plans to increase her mileage as she is running [place] marathon in Sept.     Pharmacotherapy for weight loss: She is currently taking Wellbutrin SR 150mg  for cravings.  Denies side effects.    Previous pharmacotherapy for medical weight loss:  Ozempic   Bariatric surgery:  Patient has not had bariatric surgery.     Vit D deficiency  She is not taking Vit D due to side effects.  Plans to start a MV  Lab Results  Component Value Date   VD25OH 29.7 (L) 02/12/2023   VD25OH 27.3 (L) 07/30/2022   VD25OH 27.9 (L) 03/24/2022    Prediabetes Last A1c was 5.5-looks better  Medication(s): none.  Took Ozempic in the past and did well.  Wasn't able to  continue due to cost.  Polyphagia:Yes Lab Results  Component Value Date   HGBA1C 5.5 02/12/2023   HGBA1C 5.8 (H) 07/30/2022   HGBA1C 5.7 (H) 03/24/2022   HGBA1C 5.8 (H) 04/17/2021   Lab Results  Component Value Date   INSULIN 22.5 07/30/2022   INSULIN 19.7 03/24/2022   INSULIN 17.3 11/20/2021    Hyperlipidemia Medication(s): none-looks better. Denies side effects.   Lab Results  Component Value Date   CHOL 167 02/12/2023   HDL 41 02/12/2023   LDLCALC 100 (H) 02/12/2023   TRIG 150 (H) 02/12/2023   Lab Results  Component Value Date   ALT 16 02/12/2023   AST 16 02/12/2023   ALKPHOS 62 02/12/2023   BILITOT 0.4 02/12/2023   The ASCVD Risk score (Arnett DK, et al., 2019) failed to calculate for the following reasons:   The 2019 ASCVD risk score is only valid for ages 4 to 66   PHYSICAL EXAM:  Blood pressure (!) 140/80, pulse 68, temperature 98.2 F (36.8 C), height 5\' 9"  (1.753 m), weight 281 lb (127.5 kg), SpO2 98 %. Body mass index is 41.5 kg/m.  General: She is overweight, cooperative, alert, well developed, and in no acute distress. PSYCH: Has normal mood, affect and thought process.   Extremities: No edema.  Neurologic: No gross sensory or motor deficits. No tremors or fasciculations noted.    DIAGNOSTIC  DATA REVIEWED:  BMET    Component Value Date/Time   NA 139 02/12/2023 1037   NA 140 08/21/2016 1328   K 4.4 02/12/2023 1037   K 4.0 08/21/2016 1328   CL 102 02/12/2023 1037   CO2 22 02/12/2023 1037   CO2 23 08/21/2016 1328   GLUCOSE 81 02/12/2023 1037   GLUCOSE 113 (H) 07/21/2019 0947   GLUCOSE 92 08/21/2016 1328   BUN 17 02/12/2023 1037   BUN 11.7 08/21/2016 1328   CREATININE 0.90 02/12/2023 1037   CREATININE 0.65 11/01/2018 0830   CREATININE 0.7 08/21/2016 1328   CALCIUM 9.1 02/12/2023 1037   CALCIUM 8.7 08/21/2016 1328   GFRNONAA >60 07/21/2019 0947   GFRNONAA >60 11/01/2018 0830   GFRAA >60 07/21/2019 0947   GFRAA >60 11/01/2018 0830    Lab Results  Component Value Date   HGBA1C 5.5 02/12/2023   HGBA1C 5.8 (H) 04/17/2021   Lab Results  Component Value Date   INSULIN 22.5 07/30/2022   INSULIN 17.3 11/20/2021   Lab Results  Component Value Date   TSH 1.560 07/30/2022   CBC    Component Value Date/Time   WBC 10.2 07/30/2022 1138   WBC 9.2 07/21/2019 0947   RBC 5.01 07/30/2022 1138   RBC 4.37 07/21/2019 0947   HGB 14.1 07/30/2022 1138   HGB 7.0 (L) 08/21/2016 1328   HCT 42.5 07/30/2022 1138   HCT 22.5 (L) 08/21/2016 1328   PLT 317 07/30/2022 1138   MCV 85 07/30/2022 1138   MCV 74.8 (L) 08/21/2016 1328   MCH 28.1 07/30/2022 1138   MCH 24.3 (L) 07/21/2019 0947   MCHC 33.2 07/30/2022 1138   MCHC 30.5 07/21/2019 0947   RDW 13.5 07/30/2022 1138   RDW 14.2 08/21/2016 1328   Iron Studies    Component Value Date/Time   IRON 61 03/24/2022 1021   IRON 14 (L) 08/21/2016 1328   TIBC 428 08/09/2018 0943   TIBC 459 (H) 08/21/2016 1328   FERRITIN 30 08/09/2018 0943   FERRITIN 10 08/21/2016 1328   IRONPCTSAT 17 (L) 08/09/2018 0943   IRONPCTSAT 3 (L) 08/21/2016 1328   Lipid Panel     Component Value Date/Time   CHOL 167 02/12/2023 1037   TRIG 150 (H) 02/12/2023 1037   HDL 41 02/12/2023 1037   LDLCALC 100 (H) 02/12/2023 1037   Hepatic Function Panel     Component Value Date/Time   PROT 6.6 02/12/2023 1037   PROT 6.7 08/21/2016 1328   ALBUMIN 4.3 02/12/2023 1037   ALBUMIN 3.6 08/21/2016 1328   AST 16 02/12/2023 1037   AST 10 (L) 11/01/2018 0830   AST 60 (H) 08/21/2016 1328   ALT 16 02/12/2023 1037   ALT 11 11/01/2018 0830   ALT 87 (H) 08/21/2016 1328   ALKPHOS 62 02/12/2023 1037   ALKPHOS 70 08/21/2016 1328   BILITOT 0.4 02/12/2023 1037   BILITOT 0.2 (L) 11/01/2018 0830   BILITOT 0.54 08/21/2016 1328   BILIDIR <0.1 07/21/2019 0947   IBILI NOT CALCULATED 07/21/2019 0947      Component Value Date/Time   TSH 1.560 07/30/2022 1138   Nutritional Lab Results  Component Value Date   VD25OH  29.7 (L) 02/12/2023   VD25OH 27.3 (L) 07/30/2022   VD25OH 27.9 (L) 03/24/2022     ASSESSMENT AND PLAN  TREATMENT PLAN FOR OBESITY:  Recommended Dietary Goals  Tyrika is currently in the action stage of change. As such, her goal is to continue weight management plan. She  has agreed to weeks with higher running mileage will aim for 2000-2100 and for drop down weeks will aim for 1800-1900 calories and 100+ grams of protein daily.   Behavioral Intervention  We discussed the following Behavioral Modification Strategies today: increasing lean protein intake, decreasing simple carbohydrates , increasing vegetables, increasing lower glycemic fruits, increasing water intake, work on meal planning and preparation, continue to practice mindfulness when eating, and planning for success.  Additional resources provided today: NA  Recommended Physical Activity Goals  Lillyian has been advised to work up to 150 minutes of moderate intensity aerobic activity a week and strengthening exercises 2-3 times per week for cardiovascular health, weight loss maintenance and preservation of muscle mass.   She has agreed to Continue current level of physical activity    Pharmacotherapy Avoid Phentermine; Qsymia and Contrave due to uncontrolled HTN and palpitations. .  ASSOCIATED CONDITIONS ADDRESSED TODAY  Action/Plan  Pre-diabetes Deaundra will continue to work on weight loss, exercise, and decreasing simple carbohydrates to help decrease the risk of diabetes.    Other hyperlipidemia Cardiovascular risk and specific lipid/LDL goals reviewed.  We discussed several lifestyle modifications today and Hamda will continue to work on diet, exercise and weight loss efforts. Orders and follow up as documented in patient record.   Counseling Intensive lifestyle modifications are the first line treatment for this issue. Dietary changes: Increase soluble fiber. Decrease simple carbohydrates. Exercise changes: Moderate  to vigorous-intensity aerobic activity 150 minutes per week if tolerated. Lipid-lowering medications: see documented in medical record.   Vitamin D deficiency Can't take Vit D due to side effects.  To start a multivitamin  Morbid obesity (HCC) -     Increase buPROPion HCl ER (SR); Take 1 tablet (150 mg total) by mouth 2 (two) times daily.  Dispense: 60 tablet; Refill: 0. Side effects discussed  BMI 40.0-44.9, adult (HCC) -     buPROPion HCl ER (SR); Take 1 tablet (150 mg total) by mouth 2 (two) times daily.  Dispense: 60 tablet; Refill: 0      Labs reviewed from 02/12/23   Return in about 4 weeks (around 04/20/2023).Marland Kitchen She was informed of the importance of frequent follow up visits to maximize her success with intensive lifestyle modifications for her multiple health conditions.   ATTESTASTION STATEMENTS:  Reviewed by clinician on day of visit: allergies, medications, problem list, medical history, surgical history, family history, social history, and previous encounter notes.      Theodis Sato. Jaden Abreu FNP-C

## 2023-04-02 ENCOUNTER — Encounter: Payer: Self-pay | Admitting: Nurse Practitioner

## 2023-04-02 DIAGNOSIS — Z6841 Body Mass Index (BMI) 40.0 and over, adult: Secondary | ICD-10-CM

## 2023-04-27 ENCOUNTER — Encounter: Payer: Self-pay | Admitting: Nurse Practitioner

## 2023-04-27 ENCOUNTER — Ambulatory Visit (INDEPENDENT_AMBULATORY_CARE_PROVIDER_SITE_OTHER): Payer: No Typology Code available for payment source | Admitting: Nurse Practitioner

## 2023-04-27 VITALS — BP 147/83 | HR 75 | Temp 98.5°F | Ht 69.0 in | Wt 281.0 lb

## 2023-04-27 DIAGNOSIS — R632 Polyphagia: Secondary | ICD-10-CM | POA: Diagnosis not present

## 2023-04-27 DIAGNOSIS — Z6841 Body Mass Index (BMI) 40.0 and over, adult: Secondary | ICD-10-CM

## 2023-04-27 MED ORDER — LIRAGLUTIDE 18 MG/3ML ~~LOC~~ SOPN
1.8000 mg | PEN_INJECTOR | Freq: Every day | SUBCUTANEOUS | 0 refills | Status: DC
Start: 2023-04-27 — End: 2023-05-28

## 2023-04-27 MED ORDER — INSULIN PEN NEEDLE 31G X 5 MM MISC
0 refills | Status: DC
Start: 2023-04-27 — End: 2023-06-30

## 2023-04-27 NOTE — Progress Notes (Signed)
Office: [phone]  /  Fax: [phone]  WEIGHT SUMMARY AND BIOMETRICS  Weight Lost Since Last Visit: 0lb  Weight Gained Since Last Visit: 0lb   Vitals Temp: 98.5 F (36.9 C) BP: (!) 147/83 Pulse Rate: 75 SpO2: 98 %   Anthropometric Measurements Height: 5\' 9"  (1.753 m) Weight: 281 lb (127.5 kg) BMI (Calculated): 41.48 Weight at Last Visit: 281lb Weight Lost Since Last Visit: 0lb Weight Gained Since Last Visit: 0lb Starting Weight: 284lb Total Weight Loss (lbs): 3 lb (1.361 kg)   Body Composition  Body Fat %: 40.6 % Fat Mass (lbs): 114.4 lbs Muscle Mass (lbs): 159 lbs Total Body Water (lbs): 102.6 lbs Visceral Fat Rating : 11   Other Clinical Data Fasting: No Labs: No Today's Visit #: 20 Starting Date: 11/20/21     HPI  Chief Complaint: OBESITY  Leslie Branch is here to discuss her progress with her obesity treatment plan. She is on the keeping a food journal and adhering to recommended goals of 1900 calories and 90 protein and states she is following her eating plan approximately 80-85 % of the time. She states she is exercising 30-180 minutes 3-5 days per week.   Interval History:  Since last office visit she has maintained her weight.  She went on vacation and notes that she gained weight and then re lost what she gained.  She has been making better choices and decreasing her snacking.  She is running [place] marathon in 7 weeks.  She hasn't been as active as she usually is due to problems with her right foot.  Her foot is feeling better but now her left hip is bothering her.  She is drinking water and a protein shake daily.     Pharmacotherapy for weight loss: She is currently taking Wellbutrin SR 150mg  BID for cravings.  Denies side effects.  Has helped with snacking and cravings.   Previous pharmacotherapy for medical weight loss:  Ozempic  Bariatric surgery:  Patient has not had bariatric surgery  PHYSICAL EXAM:  Blood pressure (!) 147/83, pulse 75,  temperature 98.5 F (36.9 C), height 5\' 9"  (1.753 m), weight 281 lb (127.5 kg), SpO2 98%. Body mass index is 41.5 kg/m.  General: She is overweight, cooperative, alert, well developed, and in no acute distress. PSYCH: Has normal mood, affect and thought process.   Extremities: No edema.  Neurologic: No gross sensory or motor deficits. No tremors or fasciculations noted.    DIAGNOSTIC DATA REVIEWED:  BMET    Component Value Date/Time   NA 139 02/12/2023 1037   NA 140 08/21/2016 1328   K 4.4 02/12/2023 1037   K 4.0 08/21/2016 1328   CL 102 02/12/2023 1037   CO2 22 02/12/2023 1037   CO2 23 08/21/2016 1328   GLUCOSE 81 02/12/2023 1037   GLUCOSE 113 (H) 07/21/2019 0947   GLUCOSE 92 08/21/2016 1328   BUN 17 02/12/2023 1037   BUN 11.7 08/21/2016 1328   CREATININE 0.90 02/12/2023 1037   CREATININE 0.65 11/01/2018 0830   CREATININE 0.7 08/21/2016 1328   CALCIUM 9.1 02/12/2023 1037   CALCIUM 8.7 08/21/2016 1328   GFRNONAA >60 07/21/2019 0947   GFRNONAA >60 11/01/2018 0830   GFRAA >60 07/21/2019 0947   GFRAA >60 11/01/2018 0830   Lab Results  Component Value Date   HGBA1C 5.5 02/12/2023   HGBA1C 5.8 (H) 04/17/2021   Lab Results  Component Value Date   INSULIN 22.5 07/30/2022   INSULIN 17.3 11/20/2021   Lab Results  Component Value Date   TSH 1.560 07/30/2022   CBC    Component Value Date/Time   WBC 10.2 07/30/2022 1138   WBC 9.2 07/21/2019 0947   RBC 5.01 07/30/2022 1138   RBC 4.37 07/21/2019 0947   HGB 14.1 07/30/2022 1138   HGB 7.0 (L) 08/21/2016 1328   HCT 42.5 07/30/2022 1138   HCT 22.5 (L) 08/21/2016 1328   PLT 317 07/30/2022 1138   MCV 85 07/30/2022 1138   MCV 74.8 (L) 08/21/2016 1328   MCH 28.1 07/30/2022 1138   MCH 24.3 (L) 07/21/2019 0947   MCHC 33.2 07/30/2022 1138   MCHC 30.5 07/21/2019 0947   RDW 13.5 07/30/2022 1138   RDW 14.2 08/21/2016 1328   Iron Studies    Component Value Date/Time   IRON 61 03/24/2022 1021   IRON 14 (L) 08/21/2016  1328   TIBC 428 08/09/2018 0943   TIBC 459 (H) 08/21/2016 1328   FERRITIN 30 08/09/2018 0943   FERRITIN 10 08/21/2016 1328   IRONPCTSAT 17 (L) 08/09/2018 0943   IRONPCTSAT 3 (L) 08/21/2016 1328   Lipid Panel     Component Value Date/Time   CHOL 167 02/12/2023 1037   TRIG 150 (H) 02/12/2023 1037   HDL 41 02/12/2023 1037   LDLCALC 100 (H) 02/12/2023 1037   Hepatic Function Panel     Component Value Date/Time   PROT 6.6 02/12/2023 1037   PROT 6.7 08/21/2016 1328   ALBUMIN 4.3 02/12/2023 1037   ALBUMIN 3.6 08/21/2016 1328   AST 16 02/12/2023 1037   AST 10 (L) 11/01/2018 0830   AST 60 (H) 08/21/2016 1328   ALT 16 02/12/2023 1037   ALT 11 11/01/2018 0830   ALT 87 (H) 08/21/2016 1328   ALKPHOS 62 02/12/2023 1037   ALKPHOS 70 08/21/2016 1328   BILITOT 0.4 02/12/2023 1037   BILITOT 0.2 (L) 11/01/2018 0830   BILITOT 0.54 08/21/2016 1328   BILIDIR <0.1 07/21/2019 0947   IBILI NOT CALCULATED 07/21/2019 0947      Component Value Date/Time   TSH 1.560 07/30/2022 1138   Nutritional Lab Results  Component Value Date   VD25OH 29.7 (L) 02/12/2023   VD25OH 27.3 (L) 07/30/2022   VD25OH 27.9 (L) 03/24/2022     ASSESSMENT AND PLAN  TREATMENT PLAN FOR OBESITY:  Recommended Dietary Goals  Leslie Branch is currently in the action stage of change. As such, her goal is to continue weight management plan. She has agreed to keeping a food journal and adhering to recommended goals of 1900 calories and 90+ protein.  Behavioral Intervention  We discussed the following Behavioral Modification Strategies today: increasing lean protein intake, decreasing simple carbohydrates , increasing vegetables, increasing lower glycemic fruits, increasing water intake, work on tracking and journaling calories using tracking application, reading food labels , keeping healthy foods at home, continue to practice mindfulness when eating, and planning for success.  Additional resources provided today:  NA  Recommended Physical Activity Goals  [name] has been advised to work up to 150 minutes of moderate intensity aerobic activity a week and strengthening exercises 2-3 times per week for cardiovascular health, weight loss maintenance and preservation of muscle mass.   She has agreed to Continue current level of physical activity    Pharmacotherapy We discussed various medication options to help Heidi with her weight loss efforts and we both agreed to start Victoza off label for weight loss.  Side effects discussed.  Took Ozempic in the past and did well.  Avoid Phentermine;  Qsymia and Contrave due to HTN and palpitations.   ASSOCIATED CONDITIONS ADDRESSED TODAY  Action/Plan  Polyphagia -     Start Liraglutide; Inject 1.8 mg into the skin daily.  Dispense: 3 mL; Refill: 0.  Side effects discussed -     Insulin Pen Needle; Take as directed with Victoza.  Dispense: 100 each; Refill: 0  Will start Victoza 0.6mg  daily x 1 week, then 1.2mg  daily x 1 week and then 1.8mg  daily.    Morbid obesity (HCC)  BMI 40.0-44.9, adult (HCC)         Return in about 4 weeks (around 05/25/2023).Marland Kitchen She was informed of the importance of frequent follow up visits to maximize her success with intensive lifestyle modifications for her multiple health conditions.   ATTESTASTION STATEMENTS:  Reviewed by clinician on day of visit: allergies, medications, problem list, medical history, surgical history, family history, social history, and previous encounter notes.     Theodis Sato. Racheal Mathurin FNP-C

## 2023-04-27 NOTE — Patient Instructions (Signed)

## 2023-04-28 MED ORDER — BUPROPION HCL ER (SR) 150 MG PO TB12
150.0000 mg | ORAL_TABLET | Freq: Two times a day (BID) | ORAL | 0 refills | Status: DC
Start: 2023-04-28 — End: 2023-11-23

## 2023-05-07 ENCOUNTER — Ambulatory Visit: Payer: No Typology Code available for payment source | Admitting: Cardiology

## 2023-05-07 ENCOUNTER — Encounter: Payer: Self-pay | Admitting: Cardiology

## 2023-05-07 VITALS — BP 142/75 | HR 82 | Resp 16 | Ht 69.0 in | Wt 288.0 lb

## 2023-05-07 DIAGNOSIS — R002 Palpitations: Secondary | ICD-10-CM

## 2023-05-07 DIAGNOSIS — I10 Essential (primary) hypertension: Secondary | ICD-10-CM

## 2023-05-07 NOTE — Progress Notes (Signed)
ID:  Leslie Branch, DOB 11/09/1985, MRN [id number]  PCP:  Oneita Hurt, No  Cardiologist:  Tessa Lerner, DO, [facility] (established care 05/07/23) Former Cardiology Providers: Dr. Clotilde Dieter  Date: 05/07/23  Chief Complaint  Patient presents with   Hypertension   Palpitations   Follow-up    6 month    HPI  Leslie Branch is a 37 y.o. Caucasian female whose past medical history and cardiovascular risk factors include: Hypertension, obesity due to excess calories.  Patient was being followed by my partner Dr. Clotilde Dieter and I am seeing her for the first time for the management of blood pressure and palpitations.  Since last office visit patient has not had any palpitations and is no longer on metoprolol.  Patient states that the palpitations are likely secondary to anxiety and stress related to coworker/boss.  Home blood pressures are still not at goal.  Home SBPs 140 mmHg and DBP ranges between 80-85 mmHg.  She currently takes losartan 25 mg p.o. every afternoon.  She has started to exercise more regularly and will be participating into marathons.  She runs 2 or 3 times per week and does not have any exertional chest pain or decreased physical endurance.  FUNCTIONAL STATUS: Run 2-3 times a week.    CARDIAC DATABASE: EKG: May 07, 2023: Sinus rhythm, 76 bpm, without underlying ischemia injury pattern.  Echocardiogram: 05/28/2022: Normal LV systolic function with visual EF 55-60%. Left ventricle cavity is normal in size. Normal left ventricular wall thickness. Normal global wall motion. Normal diastolic filling pattern, normal LAP. Calculated EF 56%. Left atrial cavity is mildly dilated at 40.3 ml/m^2. Structurally normal tricuspid valve.  Mild tricuspid regurgitation. No evidence of pulmonary hypertension. no prior available for comparison   Stress Testing: No results found for this or any previous visit from the past 1095 days.    ALLERGIES: Allergies  Allergen Reactions    Chloraseptic Sore Throat [Acetaminophen] Anaphylaxis    Closed throat when used spray   Eucalyptol    Other     MEDICATION LIST PRIOR TO VISIT: Current Meds  Medication Sig   buPROPion (WELLBUTRIN SR) 150 MG 12 hr tablet Take 1 tablet (150 mg total) by mouth 2 (two) times daily.   Insulin Pen Needle 31G X 5 MM MISC Take as directed with Victoza.   levonorgestrel (MIRENA, 52 MG,) 20 MCG/DAY IUD 1 each by Intrauterine route once.   losartan (COZAAR) 50 MG tablet Take 1 tablet (50 mg total) by mouth daily. (Patient taking differently: Take 25 mg by mouth daily.)   Vitamin D, Ergocalciferol, (DRISDOL) 1.25 MG (50000 UNIT) CAPS capsule Take 1 capsule (50,000 Units total) by mouth every 7 (seven) days.     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   Termination of pregnancy (fetus)    x 3 in clinics    PAST SURGICAL HISTORY: Past Surgical History:  Procedure Laterality Date   COLONOSCOPY     DILATATION & CURRETTAGE/HYSTEROSCOPY WITH RESECTOCOPE N/A 07/28/2019   Procedure: DILATATION & CURETTAGE/HYSTEROSCOPY WITH RESECTOCOPE;  Surgeon: Essie Hart, MD;  Location: [facility] OR;  Service: Gynecology;  Laterality: N/A;   DILATION AND EVACUATION N/A  07/22/2017   Procedure: DILATATION AND EVACUATION WITH GENETICS;  Surgeon: Ranae Pila, MD;  Location: [facility] ORS;  Service: Gynecology;  Laterality: N/A;   HAND SURGERY Left    pinky - reconstructive   KNEE ARTHROSCOPY Left    TONSILLECTOMY AND ADENOIDECTOMY     as a child   tubes in ears     as a child   UPPER GI ENDOSCOPY      FAMILY HISTORY: The patient family history includes Diabetes in her father and sister; Drug abuse in her father; High Cholesterol in her father; High blood pressure  in her father; Hyperlipidemia in her mother; Hypertension in her mother; Prostate cancer in her father; Sleep apnea in her mother.  SOCIAL HISTORY:  The patient  reports that she quit smoking about 12 years ago. Her smoking use included cigarettes. She started smoking about 22 years ago. She has a 10 pack-year smoking history. She has never used smokeless tobacco. She reports current alcohol use of about 1.0 standard drink of alcohol per week. She reports that she does not use drugs.  REVIEW OF SYSTEMS: Review of Systems  Cardiovascular:  Negative for chest pain, claudication, dyspnea on exertion, irregular heartbeat, leg swelling, near-syncope, orthopnea, palpitations, paroxysmal nocturnal dyspnea and syncope.  Respiratory:  Negative for shortness of breath.   Hematologic/Lymphatic: Negative for bleeding problem.  Musculoskeletal:  Negative for muscle cramps and myalgias.  Neurological:  Negative for dizziness and light-headedness.    PHYSICAL EXAM:    05/07/2023    9:05 AM 04/27/2023    9:00 AM 03/23/2023    9:00 AM  Vitals with BMI  Height 5\' 9"  5\' 9"  5\' 9"   Weight 288 lbs 281 lbs 281 lbs  BMI 42.51 41.48 41.48  Systolic 142 147 096  Diastolic 75 83 80  Pulse 82 75 68    Physical Exam  Constitutional: No distress.  Age appropriate, hemodynamically stable.   Neck: No JVD present.  Cardiovascular: Normal rate, regular rhythm, S1 normal, S2 normal, intact distal pulses and normal pulses. Exam reveals no gallop, no S3 and no S4.  No murmur heard. Pulses:      Dorsalis pedis pulses are 2+ on the right side and 2+ on the left side.       Posterior tibial pulses are 2+ on the right side and 2+ on the left side.  Pulmonary/Chest: Effort normal and breath sounds normal. No stridor. She has no wheezes. She has no rales.  Abdominal: Soft. Bowel sounds are normal. She exhibits no distension. There is no abdominal tenderness.  Musculoskeletal:        General: No edema.     Cervical back:  Neck supple.  Neurological: She is alert and oriented to person, place, and time. She has intact cranial nerves (2-12).  Skin: Skin is warm and moist.     LABORATORY DATA:    Latest Ref Rng & Units 07/30/2022   11:38 AM 03/24/2022   10:21 AM 03/15/2021    9:27 AM  CBC  WBC 3.4 - 10.8 x10E3/uL 10.2  10.5  9.8   Hemoglobin 11.1 - 15.9 g/dL 04.5  40.9  81.1   Hematocrit 34.0 - 46.6 % 42.5  41.3  39.7   Platelets 150 - 450 x10E3/uL 317  316         Latest Ref Rng & Units 02/12/2023   10:37 AM 07/30/2022   11:38 AM 03/24/2022   10:21 AM  CMP  Glucose 70 - 99 mg/dL 81  96  89   BUN 6 - 20 mg/dL 17  11  11    Creatinine 0.57 - 1.00 mg/dL 7.42  5.95  6.38   Sodium 134 - 144 mmol/L 139  141  140   Potassium 3.5 - 5.2 mmol/L 4.4  4.1  4.1   Chloride 96 - 106 mmol/L 102  101  100   CO2 20 - 29 mmol/L 22  21  24    Calcium 8.7 - 10.2 mg/dL 9.1  9.4  9.0   Total Protein 6.0 - 8.5 g/dL 6.6  7.0  7.1   Total Bilirubin 0.0 - 1.2 mg/dL 0.4  0.7  0.5   Alkaline Phos 44 - 121 IU/L 62  71  69   AST 0 - 40 IU/L 16  18  14    ALT 0 - 32 IU/L 16  17  14      Lab Results  Component Value Date   CHOL 167 02/12/2023   HDL 41 02/12/2023   LDLCALC 100 (H) 02/12/2023   TRIG 150 (H) 02/12/2023   No components found for: "NTPROBNP" No results for input(s): "PROBNP" in the last 8760 hours. Recent Labs    07/30/22 1138  TSH 1.560    BMP Recent Labs    07/30/22 1138 02/12/23 1037  NA 141 139  K 4.1 4.4  CL 101 102  CO2 21 22  GLUCOSE 96 81  BUN 11 17  CREATININE 0.72 0.90  CALCIUM 9.4 9.1    HEMOGLOBIN A1C Lab Results  Component Value Date   HGBA1C 5.5 02/12/2023    IMPRESSION:    ICD-10-CM   1. Essential hypertension  I10 EKG 12-Lead    2. Palpitations  R00.2        RECOMMENDATIONS: Leslie Branch is a 37 y.o. Caucasian female whose past medical history and cardiac risk factors include: Hypertension, obesity attacks calories.  Patient presents today for 86-month follow-up  visit.  Palpitations have essentially resolved for reasons mentioned above.  Benign essential hypertension: Home blood pressures are still not at goal, SBP 140 mmHg and DBP ranges between 80-85 mmHg. Currently takes losartan 25 mg p.o. every afternoon. She is motivated with regards to improving her modifiable cardiovascular risk factors. Reemphasize importance of exercise.  Given her age and comorbidities would recommend a goal SBP 120 mmHg if possible. For now I have asked her to take losartan 25 mg p.o. every morning. After 2 weeks if her systolic blood pressures are still not at goal have asked her to increase losartan to 50 mg p.o. daily. If additional medication titration is warranted she is asked to call the office or schedule a follow-up appointment.  Otherwise I would like to see her back in 1 year sooner if needed  Given her age I have recommended that she establish care with PCP for management of comorbid conditions and age appropriate appropriate screening.   FINAL MEDICATION LIST END OF ENCOUNTER: No orders of the defined types were placed in this encounter.   Medications Discontinued During This Encounter  Medication Reason   rosuvastatin (CRESTOR) 5 MG tablet      Current Outpatient Medications:    buPROPion (WELLBUTRIN SR) 150 MG 12 hr tablet, Take 1 tablet (150 mg total) by mouth 2 (two) times daily., Disp: 60 tablet, Rfl: 0   Insulin Pen Needle 31G X 5 MM MISC, Take as directed with Victoza., Disp: 100 each, Rfl: 0   levonorgestrel (MIRENA, 52 MG,) 20 MCG/DAY IUD, 1 each by Intrauterine  route once., Disp: , Rfl:    losartan (COZAAR) 50 MG tablet, Take 1 tablet (50 mg total) by mouth daily. (Patient taking differently: Take 25 mg by mouth daily.), Disp: 180 tablet, Rfl: 3   Vitamin D, Ergocalciferol, (DRISDOL) 1.25 MG (50000 UNIT) CAPS capsule, Take 1 capsule (50,000 Units total) by mouth every 7 (seven) days., Disp: 5 capsule, Rfl: 0   liraglutide (VICTOZA) 18 MG/3ML  SOPN, Inject 1.8 mg into the skin daily. (Patient not taking: Reported on 05/07/2023), Disp: 3 mL, Rfl: 0  Orders Placed This Encounter  Procedures   EKG 12-Lead    There are no Patient Instructions on file for this visit.   --Continue cardiac medications as reconciled in final medication list. --Return in about 1 year (around 05/06/2024) for Follow up, BP. or sooner if needed. --Continue follow-up with your primary care physician regarding the management of your other chronic comorbid conditions.  Patient's questions and concerns were addressed to her satisfaction. She voices understanding of the instructions provided during this encounter.   This note was created using a voice recognition software as a result there may be grammatical errors inadvertently enclosed that do not reflect the nature of this encounter. Every attempt is made to correct such errors.  Tessa Lerner, [place], [facility]  Pager:  [phone] Office: [phone]

## 2023-05-21 ENCOUNTER — Ambulatory Visit (INDEPENDENT_AMBULATORY_CARE_PROVIDER_SITE_OTHER): Payer: Medicaid Other | Admitting: Physician Assistant

## 2023-05-28 ENCOUNTER — Encounter: Payer: Self-pay | Admitting: Nurse Practitioner

## 2023-05-28 ENCOUNTER — Telehealth: Payer: Self-pay

## 2023-05-28 ENCOUNTER — Ambulatory Visit: Payer: No Typology Code available for payment source | Admitting: Nurse Practitioner

## 2023-05-28 VITALS — BP 141/87 | HR 80 | Temp 98.7°F | Ht 69.0 in | Wt 284.0 lb

## 2023-05-28 DIAGNOSIS — Z6841 Body Mass Index (BMI) 40.0 and over, adult: Secondary | ICD-10-CM

## 2023-05-28 DIAGNOSIS — R632 Polyphagia: Secondary | ICD-10-CM | POA: Diagnosis not present

## 2023-05-28 MED ORDER — WEGOVY 0.25 MG/0.5ML ~~LOC~~ SOAJ
0.2500 mg | SUBCUTANEOUS | 0 refills | Status: DC
Start: 2023-05-28 — End: 2023-07-28
  Filled 2023-06-22: qty 2, 28d supply, fill #0

## 2023-05-28 NOTE — Progress Notes (Signed)
Office: [phone]  /  Fax: [phone]  WEIGHT SUMMARY AND BIOMETRICS  Weight Lost Since Last Visit: 0lb  Weight Gained Since Last Visit: 3lb   Vitals Temp: 98.7 F (37.1 C) BP: (!) 141/87 Pulse Rate: 80 SpO2: 100 %   Anthropometric Measurements Height: 5\' 9"  (1.753 m) Weight: 284 lb (128.8 kg) BMI (Calculated): 41.92 Weight at Last Visit: 281lb Weight Lost Since Last Visit: 0lb Weight Gained Since Last Visit: 3lb Starting Weight: 284lb Total Weight Loss (lbs): 0 lb (0 kg)   Body Composition  Body Fat %: 44.1 % Fat Mass (lbs): 125.4 lbs Muscle Mass (lbs): 151 lbs Total Body Water (lbs): 102.2 lbs Visceral Fat Rating : 12   Other Clinical Data Fasting: No Labs: No Today's Visit #: 21 Starting Date: 11/20/21     HPI  Chief Complaint: OBESITY  Nikeeta is here to discuss her progress with her obesity treatment plan. She is on the keeping a food journal and adhering to recommended goals of 1900 calories and 90 protein and states she is following her eating plan approximately 90-100 % of the time. She states she is exercising 30 minutes 3-4 days per week.   Interval History:  Since last office visit she has gained 3 pounds. She has decreased her snacking. She is running [employer] on Sept 29th and [facility] marathon Nov 3rd.  She is running multiple days per week.  She ran 20 miles this past Sunday.  Notes polyphagia.     Pharmacotherapy for weight loss:  She is currently taking Wellbutrin SR 150mg  BID for cravings.  Denies side effects.   Previous pharmacotherapy for medical weight loss:  Ozempic-stopped due to cost    Bariatric surgery:  Patient has not had bariatric surgery.   PHYSICAL EXAM:  Blood pressure (!) 141/87, pulse 80, temperature 98.7 F (37.1 C), height 5\' 9"  (1.753 m), weight 284 lb (128.8 kg), SpO2 100%. Body mass index is 41.94 kg/m.  General: She is overweight, cooperative, alert, well developed, and in no acute distress. PSYCH:  Has normal mood, affect and thought process.   Extremities: No edema.  Neurologic: No gross sensory or motor deficits. No tremors or fasciculations noted.    DIAGNOSTIC DATA REVIEWED:  BMET    Component Value Date/Time   NA 139 02/12/2023 1037   NA 140 08/21/2016 1328   K 4.4 02/12/2023 1037   K 4.0 08/21/2016 1328   CL 102 02/12/2023 1037   CO2 22 02/12/2023 1037   CO2 23 08/21/2016 1328   GLUCOSE 81 02/12/2023 1037   GLUCOSE 113 (H) 07/21/2019 0947   GLUCOSE 92 08/21/2016 1328   BUN 17 02/12/2023 1037   BUN 11.7 08/21/2016 1328   CREATININE 0.90 02/12/2023 1037   CREATININE 0.65 11/01/2018 0830   CREATININE 0.7 08/21/2016 1328   CALCIUM 9.1 02/12/2023 1037   CALCIUM 8.7 08/21/2016 1328   GFRNONAA >60 07/21/2019 0947   GFRNONAA >60 11/01/2018 0830   GFRAA >60 07/21/2019 0947   GFRAA >60 11/01/2018 0830   Lab Results  Component Value Date   HGBA1C 5.5 02/12/2023   HGBA1C 5.8 (H) 04/17/2021   Lab Results  Component Value Date   INSULIN 22.5 07/30/2022   INSULIN 17.3 11/20/2021   Lab Results  Component Value Date   TSH 1.560 07/30/2022   CBC    Component Value Date/Time   WBC 10.2 07/30/2022 1138   WBC 9.2 07/21/2019 0947   RBC 5.01 07/30/2022 1138   RBC 4.37 07/21/2019 0947  HGB 14.1 07/30/2022 1138   HGB 7.0 (L) 08/21/2016 1328   HCT 42.5 07/30/2022 1138   HCT 22.5 (L) 08/21/2016 1328   PLT 317 07/30/2022 1138   MCV 85 07/30/2022 1138   MCV 74.8 (L) 08/21/2016 1328   MCH 28.1 07/30/2022 1138   MCH 24.3 (L) 07/21/2019 0947   MCHC 33.2 07/30/2022 1138   MCHC 30.5 07/21/2019 0947   RDW 13.5 07/30/2022 1138   RDW 14.2 08/21/2016 1328   Iron Studies    Component Value Date/Time   IRON 61 03/24/2022 1021   IRON 14 (L) 08/21/2016 1328   TIBC 428 08/09/2018 0943   TIBC 459 (H) 08/21/2016 1328   FERRITIN 30 08/09/2018 0943   FERRITIN 10 08/21/2016 1328   IRONPCTSAT 17 (L) 08/09/2018 0943   IRONPCTSAT 3 (L) 08/21/2016 1328   Lipid Panel      Component Value Date/Time   CHOL 167 02/12/2023 1037   TRIG 150 (H) 02/12/2023 1037   HDL 41 02/12/2023 1037   LDLCALC 100 (H) 02/12/2023 1037   Hepatic Function Panel     Component Value Date/Time   PROT 6.6 02/12/2023 1037   PROT 6.7 08/21/2016 1328   ALBUMIN 4.3 02/12/2023 1037   ALBUMIN 3.6 08/21/2016 1328   AST 16 02/12/2023 1037   AST 10 (L) 11/01/2018 0830   AST 60 (H) 08/21/2016 1328   ALT 16 02/12/2023 1037   ALT 11 11/01/2018 0830   ALT 87 (H) 08/21/2016 1328   ALKPHOS 62 02/12/2023 1037   ALKPHOS 70 08/21/2016 1328   BILITOT 0.4 02/12/2023 1037   BILITOT 0.2 (L) 11/01/2018 0830   BILITOT 0.54 08/21/2016 1328   BILIDIR <0.1 07/21/2019 0947   IBILI NOT CALCULATED 07/21/2019 0947      Component Value Date/Time   TSH 1.560 07/30/2022 1138   Nutritional Lab Results  Component Value Date   VD25OH 29.7 (L) 02/12/2023   VD25OH 27.3 (L) 07/30/2022   VD25OH 27.9 (L) 03/24/2022     ASSESSMENT AND PLAN  TREATMENT PLAN FOR OBESITY:  Recommended Dietary Goals  Jessamine is currently in the action stage of change. As such, her goal is to continue weight management plan. She has agreed to to track and will review at next visit.  Behavioral Intervention  We discussed the following Behavioral Modification Strategies today: increasing lean protein intake, decreasing simple carbohydrates , increasing vegetables, increasing lower glycemic fruits, increasing water intake, work on meal planning and preparation, work on tracking and journaling calories using tracking application, continue to practice mindfulness when eating, and planning for success.  Additional resources provided today: NA  Recommended Physical Activity Goals  Doyne has been advised to work up to 150 minutes of moderate intensity aerobic activity a week and strengthening exercises 2-3 times per week for cardiovascular health, weight loss maintenance and preservation of muscle mass.   She has agreed to  Continue current level of physical activity    Pharmacotherapy We discussed various medication options to help Gurtie with her weight loss efforts and we both agreed to start [facility] 0.25mg . side effects.  Patient has an IUD.   ASSOCIATED CONDITIONS ADDRESSED TODAY  Action/Plan  Polyphagia -     VWUJWJ; Inject 0.25 mg into the skin once a week.  Dispense: 2 mL; Refill: 0  Morbid obesity (HCC) -     XBJYNW; Inject 0.25 mg into the skin once a week.  Dispense: 2 mL; Refill: 0  BMI 40.0-44.9, adult (HCC) -  Wegovy; Inject 0.25 mg into the skin once a week.  Dispense: 2 mL; Refill: 0         Return in about 4 weeks (around 06/25/2023).Marland Kitchen She was informed of the importance of frequent follow up visits to maximize her success with intensive lifestyle modifications for her multiple health conditions.   ATTESTASTION STATEMENTS:  Reviewed by clinician on day of visit: allergies, medications, problem list, medical history, surgical history, family history, social history, and previous encounter notes.     Theodis Sato. Sidrah Harden FNP-C

## 2023-05-28 NOTE — Telephone Encounter (Signed)
PA submitted for [place] through [employer]. Awaiting insurance determination. EOC: [id number]

## 2023-06-01 NOTE — Telephone Encounter (Signed)
PA submitted through Cover My Meds for Wegovy (under Medicaid) Awaiting insurance determination. Key: Leslie Branch

## 2023-06-01 NOTE — Telephone Encounter (Signed)
Received fax from [employer] that Reginal Lutes has been denied. "The members plan benefit does not provide coverage for this class of medications"

## 2023-06-02 NOTE — Telephone Encounter (Addendum)
Message from plan: Approved. WEGOVY 0.25MG /0.5ML Soln Auto-inj is approved from 06/01/2023 to 11/29/2023.Marland Kitchen Authorization Expiration Date: November 29, 2023. Patient notified.

## 2023-06-22 ENCOUNTER — Other Ambulatory Visit (HOSPITAL_COMMUNITY): Payer: Self-pay

## 2023-06-22 ENCOUNTER — Encounter: Payer: Self-pay | Admitting: Hematology

## 2023-06-30 ENCOUNTER — Ambulatory Visit (INDEPENDENT_AMBULATORY_CARE_PROVIDER_SITE_OTHER): Payer: No Typology Code available for payment source | Admitting: Nurse Practitioner

## 2023-06-30 ENCOUNTER — Other Ambulatory Visit (HOSPITAL_COMMUNITY): Payer: Self-pay

## 2023-06-30 ENCOUNTER — Other Ambulatory Visit: Payer: Self-pay

## 2023-06-30 ENCOUNTER — Encounter: Payer: Self-pay | Admitting: Nurse Practitioner

## 2023-06-30 VITALS — BP 139/87 | HR 80 | Temp 98.4°F | Ht 69.0 in | Wt 284.0 lb

## 2023-06-30 DIAGNOSIS — R632 Polyphagia: Secondary | ICD-10-CM

## 2023-06-30 DIAGNOSIS — E559 Vitamin D deficiency, unspecified: Secondary | ICD-10-CM | POA: Diagnosis not present

## 2023-06-30 DIAGNOSIS — Z6841 Body Mass Index (BMI) 40.0 and over, adult: Secondary | ICD-10-CM | POA: Diagnosis not present

## 2023-06-30 MED ORDER — WEGOVY 0.5 MG/0.5ML ~~LOC~~ SOAJ
0.5000 mg | SUBCUTANEOUS | 0 refills | Status: DC
Start: 2023-06-30 — End: 2023-08-26
  Filled 2023-06-30: qty 2, 28d supply, fill #0

## 2023-06-30 NOTE — Progress Notes (Signed)
Office: [phone]  /  Fax: [phone]  WEIGHT SUMMARY AND BIOMETRICS  Weight Lost Since Last Visit: 0lb  Weight Gained Since Last Visit: 0lb   Vitals Temp: 98.4 F (36.9 C) BP: 139/87 Pulse Rate: 80 SpO2: 100 %   Anthropometric Measurements Height: 5\' 9"  (1.753 m) Weight: 284 lb (128.8 kg) BMI (Calculated): 41.92 Weight at Last Visit: 284lb Weight Lost Since Last Visit: 0lb Weight Gained Since Last Visit: 0lb Starting Weight: 284lb Total Weight Loss (lbs): 0 lb (0 kg)   Body Composition  Body Fat %: 44.5 % Fat Mass (lbs): 126.4 lbs Muscle Mass (lbs): 149.6 lbs Total Body Water (lbs): 102 lbs Visceral Fat Rating : 12   Other Clinical Data Fasting: No Labs: No Today's Visit #: 22 Starting Date: 11/20/21     HPI  Chief Complaint: OBESITY  Leslie Branch is here to discuss her progress with her obesity treatment plan. She is on the keeping a food journal and adhering to recommended goals of 1900 calories and 90 protein and states she is following her eating plan approximately 0 % of the time. She states she is exercising 60+ minutes 3-4 days per week.   Interval History:  Since last office visit she has maintained her weight.  She has a half marathon this weekend, [place]  marathon in 2 weeks and another half marathon in December.     Pharmacotherapy for weight loss: She is currently taking Wegovy 0.25mg  (x 1 dose) for medical weight loss.  Denies side effects.  Wasn't able to start when approved due to shortage.  Taking for polyphagia and cravings.    Approval dates: 06/01/2023 to 11/29/2023.   Previous pharmacotherapy for medical weight loss:   Ozempic-stopped due to cost    Bariatric surgery:  Patient has not had bariatric surgery.   Vit D deficiency  She is taking a MVI daily.  Stopped Vit D due to side effects of headaches.    Lab Results  Component Value Date   VD25OH 29.7 (L) 02/12/2023   VD25OH 27.3 (L) 07/30/2022   VD25OH 27.9 (L) 03/24/2022      PHYSICAL EXAM:  Blood pressure 139/87, pulse 80, temperature 98.4 F (36.9 C), height 5\' 9"  (1.753 m), weight 284 lb (128.8 kg), SpO2 100%. Body mass index is 41.94 kg/m.  General: She is overweight, cooperative, alert, well developed, and in no acute distress. PSYCH: Has normal mood, affect and thought process.   Extremities: No edema.  Neurologic: No gross sensory or motor deficits. No tremors or fasciculations noted.    DIAGNOSTIC DATA REVIEWED:  BMET    Component Value Date/Time   NA 139 02/12/2023 1037   NA 140 08/21/2016 1328   K 4.4 02/12/2023 1037   K 4.0 08/21/2016 1328   CL 102 02/12/2023 1037   CO2 22 02/12/2023 1037   CO2 23 08/21/2016 1328   GLUCOSE 81 02/12/2023 1037   GLUCOSE 113 (H) 07/21/2019 0947   GLUCOSE 92 08/21/2016 1328   BUN 17 02/12/2023 1037   BUN 11.7 08/21/2016 1328   CREATININE 0.90 02/12/2023 1037   CREATININE 0.65 11/01/2018 0830   CREATININE 0.7 08/21/2016 1328   CALCIUM 9.1 02/12/2023 1037   CALCIUM 8.7 08/21/2016 1328   GFRNONAA >60 07/21/2019 0947   GFRNONAA >60 11/01/2018 0830   GFRAA >60 07/21/2019 0947   GFRAA >60 11/01/2018 0830   Lab Results  Component Value Date   HGBA1C 5.5 02/12/2023   HGBA1C 5.8 (H) 04/17/2021   Lab Results  Component Value Date   INSULIN 22.5 07/30/2022   INSULIN 17.3 11/20/2021   Lab Results  Component Value Date   TSH 1.560 07/30/2022   CBC    Component Value Date/Time   WBC 10.2 07/30/2022 1138   WBC 9.2 07/21/2019 0947   RBC 5.01 07/30/2022 1138   RBC 4.37 07/21/2019 0947   HGB 14.1 07/30/2022 1138   HGB 7.0 (L) 08/21/2016 1328   HCT 42.5 07/30/2022 1138   HCT 22.5 (L) 08/21/2016 1328   PLT 317 07/30/2022 1138   MCV 85 07/30/2022 1138   MCV 74.8 (L) 08/21/2016 1328   MCH 28.1 07/30/2022 1138   MCH 24.3 (L) 07/21/2019 0947   MCHC 33.2 07/30/2022 1138   MCHC 30.5 07/21/2019 0947   RDW 13.5 07/30/2022 1138   RDW 14.2 08/21/2016 1328   Iron Studies    Component Value Date/Time    IRON 61 03/24/2022 1021   IRON 14 (L) 08/21/2016 1328   TIBC 428 08/09/2018 0943   TIBC 459 (H) 08/21/2016 1328   FERRITIN 30 08/09/2018 0943   FERRITIN 10 08/21/2016 1328   IRONPCTSAT 17 (L) 08/09/2018 0943   IRONPCTSAT 3 (L) 08/21/2016 1328   Lipid Panel     Component Value Date/Time   CHOL 167 02/12/2023 1037   TRIG 150 (H) 02/12/2023 1037   HDL 41 02/12/2023 1037   LDLCALC 100 (H) 02/12/2023 1037   Hepatic Function Panel     Component Value Date/Time   PROT 6.6 02/12/2023 1037   PROT 6.7 08/21/2016 1328   ALBUMIN 4.3 02/12/2023 1037   ALBUMIN 3.6 08/21/2016 1328   AST 16 02/12/2023 1037   AST 10 (L) 11/01/2018 0830   AST 60 (H) 08/21/2016 1328   ALT 16 02/12/2023 1037   ALT 11 11/01/2018 0830   ALT 87 (H) 08/21/2016 1328   ALKPHOS 62 02/12/2023 1037   ALKPHOS 70 08/21/2016 1328   BILITOT 0.4 02/12/2023 1037   BILITOT 0.2 (L) 11/01/2018 0830   BILITOT 0.54 08/21/2016 1328   BILIDIR <0.1 07/21/2019 0947   IBILI NOT CALCULATED 07/21/2019 0947      Component Value Date/Time   TSH 1.560 07/30/2022 1138   Nutritional Lab Results  Component Value Date   VD25OH 29.7 (L) 02/12/2023   VD25OH 27.3 (L) 07/30/2022   VD25OH 27.9 (L) 03/24/2022     ASSESSMENT AND PLAN  TREATMENT PLAN FOR OBESITY:  Recommended Dietary Goals  Leslie Branch is currently in the action stage of change. As such, her goal is to continue weight management plan. She has agreed to keeping a food journal and adhering to recommended goals of 1900-2000 calories and 100+ protein.  Behavioral Intervention  We discussed the following Behavioral Modification Strategies today: continue to work on maintaining a reduced calorie state, getting the recommended amount of protein, incorporating whole foods, making healthy choices, staying well hydrated and practicing mindfulness when eating..  Additional resources provided today: NA  Recommended Physical Activity Goals  Leslie Branch has been advised to work up  to 150 minutes of moderate intensity aerobic activity a week and strengthening exercises 2-3 times per week for cardiovascular health, weight loss maintenance and preservation of muscle mass.   She has agreed to Continue current level of physical activity    Pharmacotherapy We discussed various medication options to help Leslie Branch with her weight loss efforts and we both agreed to continue [facility] 0.25mg  and then will increase to 0.5mg .  Side effects discussed.  ASSOCIATED CONDITIONS ADDRESSED TODAY  Action/Plan  Vitamin D deficiency Taking a MVI.  Not taking Vit D 50,000 international units weekly due to side effects of headaches.   Polyphagia -     [id number]; Inject 0.5 mg into the skin once a week.  Dispense: 2 mL; Refill: 0  Morbid obesity (HCC) -     NWGNFA; Inject 0.5 mg into the skin once a week.  Dispense: 2 mL; Refill: 0  BMI 40.0-44.9, adult (HCC)         Return in about 4 weeks (around 07/28/2023).Marland Kitchen She was informed of the importance of frequent follow up visits to maximize her success with intensive lifestyle modifications for her multiple health conditions.   ATTESTASTION STATEMENTS:  Reviewed by clinician on day of visit: allergies, medications, problem list, medical history, surgical history, family history, social history, and previous encounter notes.    Theodis Sato. Amalee Olsen FNP-C

## 2023-06-30 NOTE — Patient Instructions (Signed)
Steps to starting your [facility]  The office staff will send a prior authorization request to your insurance company for approval. We will send you a mychart message once we hear back from your insurance with a decision.  This can take up to 7-10 business days.   Once your WegovyTis approved, you may then pick up [facility] pen from your pharmacy.    Learn how to do Wegovy injections on the [email or web address] website. There is a training video that will walk you through how to safely perform the injection. If you have questions for our clinical staff, please contact our  clinical staff. If you have any symptoms of allergic reaction to [facility] discontinue immediately and call 911.  1. What should I tell my provider before using WegovyT ? have or have had problems with your pancreas or kidneys. have type 2 diabetes and a history of diabetic retinopathy. have or have had depression, suicidal thoughts, or mental health issues. are pregnant or plan to become pregnant. Joesphine Bare may harm your unborn baby. You should stop using WegovyT 3 months before you plan to become pregnant or if you are breastfeeding or plan to breastfeed. It is not known if WegovyT passes into your breast milk.  2. What is Joesphine Bare and how does it work?  Joesphine Bare is an injectable prescription medication prescribed by your provider to help with your weight loss.  This medicine will be most effective when combined with a reduced calorie diet and physical activity.  Joesphine Bare is not for the treatment of type 2 diabetes mellitus. Joesphine Bare should not be used with other GLP-1 receptor agonist medicines. The addition of WegovyT in  patients treated with insulin has not been evaluated. When initiating WegovyT, consider reducing the dose of concomitantly administered insulin secretagogues (such as sulfonylureas) or insulin to reduce the risk of  hypoglycemia.  One role of GLP-1 is to send a signal to your brain to tell it you are full. It also slows down  stomach emptying which will make you feel full longer and may help with reducing cravings.   3.  How should I take WegovyT?  Administer WegovyT once weekly, on the same day each week, at any time of day, with or without meals Inject subcutaneously in the abdomen, thigh or upper arm Initiate at 0.25 mg once weekly for 4 weeks. In 4 week intervals, increase the dose until a dose of 2.4 mg is reached (we will discuss with you the dosage at each visit). The maintenance dose of WegovyT is 2.4 mg once weekly.  The dosing schedule of Wegovy is:  0.25 mg per week X 4 weeks 0.5 mg per week X 4 weeks 1.0 mg per week X 4 weeks 1.7 mg per week X 4 weeks 2.4 mg per week   Missed dose   If you miss your injection day, go ahead inject your current dose. You can go >7 days, but not <7 days between injections. You may change your injection day (It must be >7 days). If you miss >2 doses, you can still keep next injection dose the same or follow de-escalation schedule which may minimize GI symptoms.   In patients with type 2 diabetes, monitor blood glucose prior to starting and during WEGOVYT treatment.   Inject your dose of Wegovy under the skin (subcutaneous injection) in your stomach area (abdomen), upper leg (thigh) or upper arm. Do not inject into a vein or a muscle. The injection site should be rotated and not given in the  same spot each day. Hold the needle under the skin and count to "10". This will allow all of the medicine to be dispensed under the skin. Always wipe your skin with an alcohol prep pad before injection  Dispose of used pen in an approved sharps container. More practical options that can be put in the trash  to go to the landfill are milk jugs or plastic laundry detergent containers with a screw on lid.  What side effects may I notice from taking WegovyT?  Side effects that usually do not require medical attention (report to our office if they continue or are bothersome): Nausea  (most common but decreases over time in most people as their body gets used to the medicine) Diarrhea Constipation (you may take an over the counter laxative if needed) Headache Decreased appetite Upset stomach Tiredness Dizziness Feeling bloated Hair loss Belching Gas Heartburn  Side effects that you should call 911 as soon as possible Vomiting Stomach pain Fever Yellowing of your skin or eyes  Clay-colored stools Increased heart rate while at rest Low blood sugar  Sudden changes in mood, behaviors, thoughts, feelings, or thoughts of suicide If you get a lump or swelling in your neck, hoarseness, trouble swallowing, or shortness of breath. Allergic reaction such as skin rash, itching, hives, swelling of the face, tongue, or lips  Helpful tips for managing nausea Nausea is a common side effect when first starting WegovyT. If you experience nausea, be sure to connect with your health care provider. He or she will offer guidance on ways to manage it, which may include: Eat bland, low-fat foods, like crackers, toast and rice  Eat foods that contain water, like soups and gelatin  Avoid lying down after you eat  Go outdoors for fresh air  Eat more slowly    Other important information Do not drop your pen or knock it against hard surfaces  Do not expose your pen to any liquids  If you think that your pen may be damaged, do not try to fix it. Use a new one Keep the pen cap on until you are ready to inject. Your pen will no longer be sterile if you store an unused pen without the cap, if you pull the pen cap off and put it on again, or if the pen cap is missing. This could lead to an infection  Store the [place] pen in the refrigerator from 82F to 85F (2C to 8C) If needed, before removing the pen cap, WegovyT can be stored from 8C to 30C (85F to 68F) in the original carton for up to 28 days.  Keep WegovyT in the original carton to protect it from light  Do not freeze   Throw away pen if WegovyT has been frozen, has been exposed to light or temperatures above 68F (30C), or has been out of the refrigerator for 28 days or longer It's important to properly dispose of your used WegovyT pens. Do not throw the pen away in your household trash. Instead, use an FDA-cleared sharps disposable container or a sturdy household container with a tight-fitting lid, like a heavy duty plastic container.   WGNFAO pen training website: [employer]Th[employer]  Wegovy savings and support link: achegone.com

## 2023-07-01 ENCOUNTER — Other Ambulatory Visit: Payer: Self-pay

## 2023-07-02 ENCOUNTER — Other Ambulatory Visit (HOSPITAL_COMMUNITY): Payer: Self-pay

## 2023-07-28 ENCOUNTER — Other Ambulatory Visit (HOSPITAL_COMMUNITY): Payer: Self-pay

## 2023-07-28 ENCOUNTER — Encounter: Payer: Self-pay | Admitting: Nurse Practitioner

## 2023-07-28 ENCOUNTER — Ambulatory Visit (INDEPENDENT_AMBULATORY_CARE_PROVIDER_SITE_OTHER): Payer: No Typology Code available for payment source | Admitting: Nurse Practitioner

## 2023-07-28 VITALS — BP 160/80 | HR 69 | Temp 97.9°F | Ht 69.0 in | Wt 285.0 lb

## 2023-07-28 DIAGNOSIS — I10 Essential (primary) hypertension: Secondary | ICD-10-CM

## 2023-07-28 DIAGNOSIS — E559 Vitamin D deficiency, unspecified: Secondary | ICD-10-CM | POA: Diagnosis not present

## 2023-07-28 DIAGNOSIS — Z6841 Body Mass Index (BMI) 40.0 and over, adult: Secondary | ICD-10-CM

## 2023-07-28 DIAGNOSIS — R632 Polyphagia: Secondary | ICD-10-CM

## 2023-07-28 MED ORDER — WEGOVY 1 MG/0.5ML ~~LOC~~ SOAJ
1.0000 mg | SUBCUTANEOUS | 0 refills | Status: DC
  Filled 2023-07-28: qty 2, 28d supply, fill #0

## 2023-07-28 NOTE — Progress Notes (Signed)
Office: [phone]  /  Fax: [phone]  WEIGHT SUMMARY AND BIOMETRICS  Weight Lost Since Last Visit: 0  Weight Gained Since Last Visit: 1 lb   Vitals Temp: 97.9 F (36.6 C) BP: (!) 160/80 Pulse Rate: 69 SpO2: 99 %   Anthropometric Measurements Height: 5\' 9"  (1.753 m) Weight: 285 lb (129.3 kg) BMI (Calculated): 42.07 Weight at Last Visit: 284 lb Weight Lost Since Last Visit: 0 Weight Gained Since Last Visit: 1 lb Starting Weight: 284 lb Total Weight Loss (lbs): 0 lb (0 kg)   Body Composition  Body Fat %: 45.1 % Fat Mass (lbs): 128.6 lbs Muscle Mass (lbs): 148.6 lbs Total Body Water (lbs): 103.4 lbs Visceral Fat Rating : 13   Other Clinical Data Fasting: no Labs: no Today's Visit #: 23 Starting Date: 11/20/21     HPI  Chief Complaint: OBESITY  Leslie Branch is here to discuss her progress with her obesity treatment plan. She is on the 1900 calories and states she is following her eating plan approximately 50 % of the time. She states she is exercising 30 minutes 3-4 days per week.   Interval History:  Since last office visit she has gained 1 pound.  Since her last visit she ran the [facility] marathon.  She is planning to run a half marathon in December.     Pharmacotherapy for weight loss: She is currently taking Wegovy 0.5mg  (3rd dose due tomorrow) for medical weight loss.  Denies side effects.  Has helped with polyphagia and cravings.    Approval dates: 06/01/2023 to 11/29/2023   Previous pharmacotherapy for medical weight loss:  Ozempic-stopped due to cost    Bariatric surgery:  Patient has not had bariatric surgery.      Hypertension Hypertension BP is elevated today.  Reports taking OTC cold meds today.  Medication(s): Cozaar 25mg .  Denies side effects.  Denies chest pain, palpitations and SOB.  BP Readings from Last 3 Encounters:  07/28/23 (!) 160/80  06/30/23 139/87  05/28/23 (!) 141/87   Lab Results  Component Value Date   CREATININE 0.90  02/12/2023   CREATININE 0.72 07/30/2022   CREATININE 0.68 03/24/2022    Vit D deficiency  She is not taking Vit D 50,000 IU weekly.  Reports side effects of headaches. Taking OTC MVI.   Lab Results  Component Value Date   VD25OH 29.7 (L) 02/12/2023   VD25OH 27.3 (L) 07/30/2022   VD25OH 27.9 (L) 03/24/2022     PHYSICAL EXAM:  Blood pressure (!) 160/80, pulse 69, temperature 97.9 F (36.6 C), height 5\' 9"  (1.753 m), weight 285 lb (129.3 kg), SpO2 99%. Body mass index is 42.09 kg/m.  General: She is overweight, cooperative, alert, well developed, and in no acute distress. PSYCH: Has normal mood, affect and thought process.   Extremities: No edema.  Neurologic: No gross sensory or motor deficits. No tremors or fasciculations noted.    DIAGNOSTIC DATA REVIEWED:  BMET    Component Value Date/Time   NA 139 02/12/2023 1037   NA 140 08/21/2016 1328   K 4.4 02/12/2023 1037   K 4.0 08/21/2016 1328   CL 102 02/12/2023 1037   CO2 22 02/12/2023 1037   CO2 23 08/21/2016 1328   GLUCOSE 81 02/12/2023 1037   GLUCOSE 113 (H) 07/21/2019 0947   GLUCOSE 92 08/21/2016 1328   BUN 17 02/12/2023 1037   BUN 11.7 08/21/2016 1328   CREATININE 0.90 02/12/2023 1037   CREATININE 0.65 11/01/2018 0830   CREATININE 0.7 08/21/2016  1328   CALCIUM 9.1 02/12/2023 1037   CALCIUM 8.7 08/21/2016 1328   GFRNONAA >60 07/21/2019 0947   GFRNONAA >60 11/01/2018 0830   GFRAA >60 07/21/2019 0947   GFRAA >60 11/01/2018 0830   Lab Results  Component Value Date   HGBA1C 5.5 02/12/2023   HGBA1C 5.8 (H) 04/17/2021   Lab Results  Component Value Date   INSULIN 22.5 07/30/2022   INSULIN 17.3 11/20/2021   Lab Results  Component Value Date   TSH 1.560 07/30/2022   CBC    Component Value Date/Time   WBC 10.2 07/30/2022 1138   WBC 9.2 07/21/2019 0947   RBC 5.01 07/30/2022 1138   RBC 4.37 07/21/2019 0947   HGB 14.1 07/30/2022 1138   HGB 7.0 (L) 08/21/2016 1328   HCT 42.5 07/30/2022 1138   HCT 22.5  (L) 08/21/2016 1328   PLT 317 07/30/2022 1138   MCV 85 07/30/2022 1138   MCV 74.8 (L) 08/21/2016 1328   MCH 28.1 07/30/2022 1138   MCH 24.3 (L) 07/21/2019 0947   MCHC 33.2 07/30/2022 1138   MCHC 30.5 07/21/2019 0947   RDW 13.5 07/30/2022 1138   RDW 14.2 08/21/2016 1328   Iron Studies    Component Value Date/Time   IRON 61 03/24/2022 1021   IRON 14 (L) 08/21/2016 1328   TIBC 428 08/09/2018 0943   TIBC 459 (H) 08/21/2016 1328   FERRITIN 30 08/09/2018 0943   FERRITIN 10 08/21/2016 1328   IRONPCTSAT 17 (L) 08/09/2018 0943   IRONPCTSAT 3 (L) 08/21/2016 1328   Lipid Panel     Component Value Date/Time   CHOL 167 02/12/2023 1037   TRIG 150 (H) 02/12/2023 1037   HDL 41 02/12/2023 1037   LDLCALC 100 (H) 02/12/2023 1037   Hepatic Function Panel     Component Value Date/Time   PROT 6.6 02/12/2023 1037   PROT 6.7 08/21/2016 1328   ALBUMIN 4.3 02/12/2023 1037   ALBUMIN 3.6 08/21/2016 1328   AST 16 02/12/2023 1037   AST 10 (L) 11/01/2018 0830   AST 60 (H) 08/21/2016 1328   ALT 16 02/12/2023 1037   ALT 11 11/01/2018 0830   ALT 87 (H) 08/21/2016 1328   ALKPHOS 62 02/12/2023 1037   ALKPHOS 70 08/21/2016 1328   BILITOT 0.4 02/12/2023 1037   BILITOT 0.2 (L) 11/01/2018 0830   BILITOT 0.54 08/21/2016 1328   BILIDIR <0.1 07/21/2019 0947   IBILI NOT CALCULATED 07/21/2019 0947      Component Value Date/Time   TSH 1.560 07/30/2022 1138   Nutritional Lab Results  Component Value Date   VD25OH 29.7 (L) 02/12/2023   VD25OH 27.3 (L) 07/30/2022   VD25OH 27.9 (L) 03/24/2022     ASSESSMENT AND PLAN  TREATMENT PLAN FOR OBESITY:  Recommended Dietary Goals  Reylene is currently in the action stage of change. As such, her goal is to continue weight management plan. She has agreed to keeping a food journal and adhering to recommended goals of 1900 calories and 100+ protein.  Behavioral Intervention  We discussed the following Behavioral Modification Strategies today: increasing  lean protein intake to established goals, reading food labels , keeping healthy foods at home, and continue to work on maintaining a reduced calorie state, getting the recommended amount of protein, incorporating whole foods, making healthy choices, staying well hydrated and practicing mindfulness when eating..  Additional resources provided today: NA  Recommended Physical Activity Goals  Shardea has been advised to work up to 150 minutes of moderate  intensity aerobic activity a week and strengthening exercises 2-3 times per week for cardiovascular health, weight loss maintenance and preservation of muscle mass.   She has agreed to Continue current level of physical activity    Pharmacotherapy We discussed various medication options to help Maecy with her weight loss efforts and we both agreed to continue [facility] 0.5mg  x 2 doses and then increase to 1mg . Side effects discussed.   ASSOCIATED CONDITIONS ADDRESSED TODAY  Action/Plan  Essential hypertension Increase Cozaar to 50mg .  Continue to follow up with PCP.    Polyphagia -     Increase Wegovy; Inject 1 mg into the skin once a week.  Dispense: 2 mL; Refill: 0  Vitamin D deficiency Continue MVI.  Can't take Vit D 50,000 international units  weekly due to side effects.    Morbid obesity (HCC) -     ZHYQMV; Inject 1 mg into the skin once a week.  Dispense: 2 mL; Refill: 0  BMI 40.0-44.9, adult (HCC)      To send me a mychart message next week with calories and weight.     Return in about 4 weeks (around 08/25/2023).Marland Kitchen She was informed of the importance of frequent follow up visits to maximize her success with intensive lifestyle modifications for her multiple health conditions.   ATTESTASTION STATEMENTS:  Reviewed by clinician on day of visit: allergies, medications, problem list, medical history, surgical history, family history, social history, and previous encounter notes.     Theodis Sato. Marlon Vonruden FNP-C

## 2023-08-26 ENCOUNTER — Other Ambulatory Visit (HOSPITAL_COMMUNITY): Payer: Self-pay

## 2023-08-26 ENCOUNTER — Encounter: Payer: Self-pay | Admitting: Nurse Practitioner

## 2023-08-26 ENCOUNTER — Ambulatory Visit (INDEPENDENT_AMBULATORY_CARE_PROVIDER_SITE_OTHER): Payer: No Typology Code available for payment source | Admitting: Nurse Practitioner

## 2023-08-26 VITALS — BP 133/85 | HR 69 | Temp 97.9°F | Ht 69.0 in | Wt 282.0 lb

## 2023-08-26 DIAGNOSIS — Z6841 Body Mass Index (BMI) 40.0 and over, adult: Secondary | ICD-10-CM | POA: Diagnosis not present

## 2023-08-26 DIAGNOSIS — R632 Polyphagia: Secondary | ICD-10-CM | POA: Diagnosis not present

## 2023-08-26 DIAGNOSIS — I10 Essential (primary) hypertension: Secondary | ICD-10-CM | POA: Diagnosis not present

## 2023-08-26 MED ORDER — WEGOVY 1.7 MG/0.75ML ~~LOC~~ SOAJ
1.7000 mg | SUBCUTANEOUS | 0 refills | Status: DC
  Filled 2023-08-26: qty 3, 28d supply, fill #0

## 2023-08-26 NOTE — Progress Notes (Signed)
Office: [phone]  /  Fax: [phone]  WEIGHT SUMMARY AND BIOMETRICS  Weight Lost Since Last Visit: 0lb  Weight Gained Since Last Visit: 3lb   Vitals Temp: 97.9 F (36.6 C) BP: 133/85 Pulse Rate: 69 SpO2: 99 %   Anthropometric Measurements Height: 5\' 9"  (1.753 m) Weight: 282 lb (127.9 kg) BMI (Calculated): 41.63 Weight at Last Visit: 285lb Weight Lost Since Last Visit: 0lb Weight Gained Since Last Visit: 3lb Starting Weight: 284lb Total Weight Loss (lbs): 2 lb (0.907 kg)   Body Composition  Body Fat %: 44.1 % Fat Mass (lbs): 124.8 lbs Muscle Mass (lbs): 150 lbs Total Body Water (lbs): 102 lbs Visceral Fat Rating : 12   Other Clinical Data Fasting: No Labs: No Today's Visit #: 24 Starting Date: 11/20/21     HPI  Chief Complaint: OBESITY  Leslie Branch is here to discuss her progress with her obesity treatment plan. She is on the keeping a food journal and adhering to recommended goals of 1900 calories and 80+ protein and states she is following her eating plan approximately 75 % of the time. She states she is exercising 30-120 minutes 3-4 days per week.   Interval History:  Since last office visit she has gained 3 pounds.  She is in the process of building a home gym.  She is running 3 days per week.  Goal is to run one full marathon, one 50k and several half marathons next year.    Pharmacotherapy for weight loss: She is currently taking Wegovy 1 mg (x 2 doses) for medical weight loss.  Reports side effects of GERD with certain foods she eats.  Notes has helped with polyphagia and cravings as Reginal Lutes has been increased.     Approval dates: 06/01/2023 to 11/29/2023    Previous pharmacotherapy for medical weight loss:  Ozempic-stopped due to cost     Bariatric surgery:  Patient has not had bariatric surgery.      Hypertension Hypertension BP looks better today.  Medication(s): Cozaar 25 mg.  Denies side effects  Denies chest pain, palpitations, LEE and  SHOB.  BP Readings from Last 3 Encounters:  08/26/23 133/85  07/28/23 (!) 160/80  06/30/23 139/87   Lab Results  Component Value Date   CREATININE 0.90 02/12/2023   CREATININE 0.72 07/30/2022   CREATININE 0.68 03/24/2022     PHYSICAL EXAM:  Blood pressure 133/85, pulse 69, temperature 97.9 F (36.6 C), height 5\' 9"  (1.753 m), weight 282 lb (127.9 kg), SpO2 99%. Body mass index is 41.64 kg/m.  General: She is overweight, cooperative, alert, well developed, and in no acute distress. PSYCH: Has normal mood, affect and thought process.   Extremities: No edema.  Neurologic: No gross sensory or motor deficits. No tremors or fasciculations noted.    DIAGNOSTIC DATA REVIEWED:  BMET    Component Value Date/Time   NA 139 02/12/2023 1037   NA 140 08/21/2016 1328   K 4.4 02/12/2023 1037   K 4.0 08/21/2016 1328   CL 102 02/12/2023 1037   CO2 22 02/12/2023 1037   CO2 23 08/21/2016 1328   GLUCOSE 81 02/12/2023 1037   GLUCOSE 113 (H) 07/21/2019 0947   GLUCOSE 92 08/21/2016 1328   BUN 17 02/12/2023 1037   BUN 11.7 08/21/2016 1328   CREATININE 0.90 02/12/2023 1037   CREATININE 0.65 11/01/2018 0830   CREATININE 0.7 08/21/2016 1328   CALCIUM 9.1 02/12/2023 1037   CALCIUM 8.7 08/21/2016 1328   GFRNONAA >60 07/21/2019 0947  GFRNONAA >60 11/01/2018 0830   GFRAA >60 07/21/2019 0947   GFRAA >60 11/01/2018 0830   Lab Results  Component Value Date   HGBA1C 5.5 02/12/2023   HGBA1C 5.8 (H) 04/17/2021   Lab Results  Component Value Date   INSULIN 22.5 07/30/2022   INSULIN 17.3 11/20/2021   Lab Results  Component Value Date   TSH 1.560 07/30/2022   CBC    Component Value Date/Time   WBC 10.2 07/30/2022 1138   WBC 9.2 07/21/2019 0947   RBC 5.01 07/30/2022 1138   RBC 4.37 07/21/2019 0947   HGB 14.1 07/30/2022 1138   HGB 7.0 (L) 08/21/2016 1328   HCT 42.5 07/30/2022 1138   HCT 22.5 (L) 08/21/2016 1328   PLT 317 07/30/2022 1138   MCV 85 07/30/2022 1138   MCV 74.8 (L)  08/21/2016 1328   MCH 28.1 07/30/2022 1138   MCH 24.3 (L) 07/21/2019 0947   MCHC 33.2 07/30/2022 1138   MCHC 30.5 07/21/2019 0947   RDW 13.5 07/30/2022 1138   RDW 14.2 08/21/2016 1328   Iron Studies    Component Value Date/Time   IRON 61 03/24/2022 1021   IRON 14 (L) 08/21/2016 1328   TIBC 428 08/09/2018 0943   TIBC 459 (H) 08/21/2016 1328   FERRITIN 30 08/09/2018 0943   FERRITIN 10 08/21/2016 1328   IRONPCTSAT 17 (L) 08/09/2018 0943   IRONPCTSAT 3 (L) 08/21/2016 1328   Lipid Panel     Component Value Date/Time   CHOL 167 02/12/2023 1037   TRIG 150 (H) 02/12/2023 1037   HDL 41 02/12/2023 1037   LDLCALC 100 (H) 02/12/2023 1037   Hepatic Function Panel     Component Value Date/Time   PROT 6.6 02/12/2023 1037   PROT 6.7 08/21/2016 1328   ALBUMIN 4.3 02/12/2023 1037   ALBUMIN 3.6 08/21/2016 1328   AST 16 02/12/2023 1037   AST 10 (L) 11/01/2018 0830   AST 60 (H) 08/21/2016 1328   ALT 16 02/12/2023 1037   ALT 11 11/01/2018 0830   ALT 87 (H) 08/21/2016 1328   ALKPHOS 62 02/12/2023 1037   ALKPHOS 70 08/21/2016 1328   BILITOT 0.4 02/12/2023 1037   BILITOT 0.2 (L) 11/01/2018 0830   BILITOT 0.54 08/21/2016 1328   BILIDIR <0.1 07/21/2019 0947   IBILI NOT CALCULATED 07/21/2019 0947      Component Value Date/Time   TSH 1.560 07/30/2022 1138   Nutritional Lab Results  Component Value Date   VD25OH 29.7 (L) 02/12/2023   VD25OH 27.3 (L) 07/30/2022   VD25OH 27.9 (L) 03/24/2022     ASSESSMENT AND PLAN  TREATMENT PLAN FOR OBESITY:  Recommended Dietary Goals  [name] is currently in the action stage of change. As such, her goal is to continue weight management plan. She has agreed to keeping a food journal and adhering to recommended goals of 1900 calories and 100+ protein.  Behavioral Intervention  We discussed the following Behavioral Modification Strategies today: increasing lean protein intake to established goals, decreasing simple carbohydrates , increasing  vegetables, increasing fiber rich foods, increasing water intake , work on meal planning and preparation, reading food labels , keeping healthy foods at home, celebration eating strategies, and continue to work on maintaining a reduced calorie state, getting the recommended amount of protein, incorporating whole foods, making healthy choices, staying well hydrated and practicing mindfulness when eating..  Additional resources provided today: NA  Recommended Physical Activity Goals  Makaliah has been advised to work up to 150 minutes  of moderate intensity aerobic activity a week and strengthening exercises 2-3 times per week for cardiovascular health, weight loss maintenance and preservation of muscle mass.   She has agreed to Continue current level of physical activity  and Increase the intensity, frequency or duration of strengthening exercises    Pharmacotherapy We discussed various medication options to help Teionna with her weight loss efforts and we both agreed to continue [facility] 1mg  x 2 doses and then will increase to 1.7mg .  Side effects discussed.  ASSOCIATED CONDITIONS ADDRESSED TODAY  Action/Plan  Essential hypertension Continue to follow up with PCP.  Continue meds as directed  Polyphagia       -     [id number]; Inject 1.7 mg into the skin once a week.  Dispense: 3 mL; Refill: 0  Morbid obesity (HCC) -     ZOXWRU; Inject 1.7 mg into the skin once a week.  Dispense: 3 mL; Refill: 0  BMI 40.0-44.9, adult (HCC)     Will obtain labs at next visit.   Bio impedance reviewed with patient-muscle mass increased, body fat % decreased and fat mass in lbs decreased.   Return in about 4 weeks (around 09/23/2023).Marland Kitchen She was informed of the importance of frequent follow up visits to maximize her success with intensive lifestyle modifications for her multiple health conditions.   ATTESTASTION STATEMENTS:  Reviewed by clinician on day of visit: allergies, medications, problem list, medical  history, surgical history, family history, social history, and previous encounter notes.     Theodis Sato. Betheny Suchecki FNP-C

## 2023-10-01 ENCOUNTER — Ambulatory Visit: Payer: No Typology Code available for payment source | Admitting: Nurse Practitioner

## 2023-10-01 ENCOUNTER — Encounter: Payer: Self-pay | Admitting: Nurse Practitioner

## 2023-10-01 ENCOUNTER — Other Ambulatory Visit (HOSPITAL_COMMUNITY): Payer: Self-pay

## 2023-10-01 VITALS — BP 132/85 | HR 70 | Temp 98.2°F | Ht 69.0 in | Wt 278.0 lb

## 2023-10-01 DIAGNOSIS — R632 Polyphagia: Secondary | ICD-10-CM

## 2023-10-01 DIAGNOSIS — Z6841 Body Mass Index (BMI) 40.0 and over, adult: Secondary | ICD-10-CM

## 2023-10-01 DIAGNOSIS — E559 Vitamin D deficiency, unspecified: Secondary | ICD-10-CM

## 2023-10-01 MED ORDER — WEGOVY 1.7 MG/0.75ML ~~LOC~~ SOAJ
1.7000 mg | SUBCUTANEOUS | 0 refills | Status: DC
  Filled 2023-10-01: qty 3, 28d supply, fill #0

## 2023-10-01 MED ORDER — VITAMIN D (ERGOCALCIFEROL) 1.25 MG (50000 UNIT) PO CAPS
50000.0000 [IU] | ORAL_CAPSULE | ORAL | 0 refills | Status: DC
  Filled 2023-10-01: qty 5, 35d supply, fill #0

## 2023-10-01 NOTE — Progress Notes (Signed)
Office: [phone]  /  Fax: [phone]  WEIGHT SUMMARY AND BIOMETRICS  Weight Lost Since Last Visit: 4lb  Weight Gained Since Last Visit: 0lb   Vitals Temp: 98.2 F (36.8 C) BP: 132/85 Pulse Rate: 70 SpO2: 99 %   Anthropometric Measurements Height: 5\' 9"  (1.753 m) Weight: 278 lb (126.1 kg) BMI (Calculated): 41.03 Weight at Last Visit: 282lb Weight Lost Since Last Visit: 4lb Weight Gained Since Last Visit: 0lb Starting Weight: 284lb Total Weight Loss (lbs): 6 lb (2.722 kg)   No data recorded Other Clinical Data Fasting: Yes Labs: Yes Today's Visit #: 25 Starting Date: 11/20/21     HPI  Chief Complaint: OBESITY  Leslie Branch is here to discuss her progress with her obesity treatment plan. She is on the keeping a food journal and adhering to recommended goals of 1900 calories and 80+ protein and states she is following her eating plan approximately 90 % of the time. She states she is exercising 30+ minutes 3 days per week.   Interval History:  Since last office visit she has lost 4 pounds.  Has done well with weight loss.  Still struggles some with craving candy but notes since starting Wegovy, that has helped to decrease her candy intake/cravings. She is planning to increase her running.  She has a 5 mile race this weekend, a 8 mile race in Feb, 10 mile and half marathon in March and a 50K and a 10 miles in April. She is drinking water and a protein shake daily.  She is making her garage into a gym.    Pharmacotherapy for weight loss: She is currently taking Wegovy 1.7mg  (x 4 doses) for medical weight loss.  Reports side effects of GERD with certain foods she eats and constipation.  Notes has helped with polyphagia and cravings. Has also helped with mindless eating.  She is not constantly thinking about food.     Approval dates: 06/01/2023 to 11/29/2023    Previous pharmacotherapy for medical weight loss:  Ozempic-stopped due to cost     Bariatric surgery:   Patient has not had bariatric surgery.       Vit D deficiency  She is taking Vit D 50,000 IU weekly.  Denies side effects.  Denies nausea, vomiting or muscle weakness.    Lab Results  Component Value Date   VD25OH 29.7 (L) 02/12/2023   VD25OH 27.3 (L) 07/30/2022   VD25OH 27.9 (L) 03/24/2022     PHYSICAL EXAM:  Blood pressure 132/85, pulse 70, temperature 98.2 F (36.8 C), height 5\' 9"  (1.753 m), weight 278 lb (126.1 kg), SpO2 99%. Body mass index is 41.05 kg/m.  General: She is overweight, cooperative, alert, well developed, and in no acute distress. PSYCH: Has normal mood, affect and thought process.   Extremities: No edema.  Neurologic: No gross sensory or motor deficits. No tremors or fasciculations noted.    DIAGNOSTIC DATA REVIEWED:  BMET    Component Value Date/Time   NA 139 02/12/2023 1037   NA 140 08/21/2016 1328   K 4.4 02/12/2023 1037   K 4.0 08/21/2016 1328   CL 102 02/12/2023 1037   CO2 22 02/12/2023 1037   CO2 23 08/21/2016 1328   GLUCOSE 81 02/12/2023 1037   GLUCOSE 113 (H) 07/21/2019 0947   GLUCOSE 92 08/21/2016 1328   BUN 17 02/12/2023 1037   BUN 11.7 08/21/2016 1328   CREATININE 0.90 02/12/2023 1037   CREATININE 0.65 11/01/2018 0830   CREATININE 0.7 08/21/2016 1328  CALCIUM 9.1 02/12/2023 1037   CALCIUM 8.7 08/21/2016 1328   GFRNONAA >60 07/21/2019 0947   GFRNONAA >60 11/01/2018 0830   GFRAA >60 07/21/2019 0947   GFRAA >60 11/01/2018 0830   Lab Results  Component Value Date   HGBA1C 5.5 02/12/2023   HGBA1C 5.8 (H) 04/17/2021   Lab Results  Component Value Date   INSULIN 22.5 07/30/2022   INSULIN 17.3 11/20/2021   Lab Results  Component Value Date   TSH 1.560 07/30/2022   CBC    Component Value Date/Time   WBC 10.2 07/30/2022 1138   WBC 9.2 07/21/2019 0947   RBC 5.01 07/30/2022 1138   RBC 4.37 07/21/2019 0947   HGB 14.1 07/30/2022 1138   HGB 7.0 (L) 08/21/2016 1328   HCT 42.5 07/30/2022 1138   HCT 22.5 (L) 08/21/2016 1328    PLT 317 07/30/2022 1138   MCV 85 07/30/2022 1138   MCV 74.8 (L) 08/21/2016 1328   MCH 28.1 07/30/2022 1138   MCH 24.3 (L) 07/21/2019 0947   MCHC 33.2 07/30/2022 1138   MCHC 30.5 07/21/2019 0947   RDW 13.5 07/30/2022 1138   RDW 14.2 08/21/2016 1328   Iron Studies    Component Value Date/Time   IRON 61 03/24/2022 1021   IRON 14 (L) 08/21/2016 1328   TIBC 428 08/09/2018 0943   TIBC 459 (H) 08/21/2016 1328   FERRITIN 30 08/09/2018 0943   FERRITIN 10 08/21/2016 1328   IRONPCTSAT 17 (L) 08/09/2018 0943   IRONPCTSAT 3 (L) 08/21/2016 1328   Lipid Panel     Component Value Date/Time   CHOL 167 02/12/2023 1037   TRIG 150 (H) 02/12/2023 1037   HDL 41 02/12/2023 1037   LDLCALC 100 (H) 02/12/2023 1037   Hepatic Function Panel     Component Value Date/Time   PROT 6.6 02/12/2023 1037   PROT 6.7 08/21/2016 1328   ALBUMIN 4.3 02/12/2023 1037   ALBUMIN 3.6 08/21/2016 1328   AST 16 02/12/2023 1037   AST 10 (L) 11/01/2018 0830   AST 60 (H) 08/21/2016 1328   ALT 16 02/12/2023 1037   ALT 11 11/01/2018 0830   ALT 87 (H) 08/21/2016 1328   ALKPHOS 62 02/12/2023 1037   ALKPHOS 70 08/21/2016 1328   BILITOT 0.4 02/12/2023 1037   BILITOT 0.2 (L) 11/01/2018 0830   BILITOT 0.54 08/21/2016 1328   BILIDIR <0.1 07/21/2019 0947   IBILI NOT CALCULATED 07/21/2019 0947      Component Value Date/Time   TSH 1.560 07/30/2022 1138   Nutritional Lab Results  Component Value Date   VD25OH 29.7 (L) 02/12/2023   VD25OH 27.3 (L) 07/30/2022   VD25OH 27.9 (L) 03/24/2022     ASSESSMENT AND PLAN  TREATMENT PLAN FOR OBESITY:  Recommended Dietary Goals  Leslie Branch is currently in the action stage of change. As such, her goal is to continue weight management plan. She has agreed to keeping a food journal and adhering to recommended goals of 1900 calories and 100+ protein.  Behavioral Intervention  We discussed the following Behavioral Modification Strategies today: increasing lean protein intake to  established goals, increasing vegetables, increasing water intake , keeping healthy foods at home, and continue to work on maintaining a reduced calorie state, getting the recommended amount of protein, incorporating whole foods, making healthy choices, staying well hydrated and practicing mindfulness when eating..  Additional resources provided today: NA  Recommended Physical Activity Goals  Leslie Branch has been advised to work up to 150 minutes of moderate intensity  aerobic activity a week and strengthening exercises 2-3 times per week for cardiovascular health, weight loss maintenance and preservation of muscle mass.   She has agreed to Continue current level of physical activity  and Increase the intensity, frequency or duration of strengthening exercises    Pharmacotherapy We discussed various medication options to help Leslie Branch with her weight loss efforts and we both agreed to continue [facility] 1.7mg .  Side effects discussed.  ASSOCIATED CONDITIONS ADDRESSED TODAY  Action/Plan  Polyphagia -     WGNFAO; Inject 1.7 mg into the skin once a week.  Dispense: 3 mL; Refill: 0  Vitamin D deficiency -     Vitamin D (Ergocalciferol); Take 1 capsule (50,000 Units total) by mouth every 7 (seven) days.  Dispense: 5 capsule; Refill: 0  Morbid obesity (HCC) -     ZHYQMV; Inject 1.7 mg into the skin once a week.  Dispense: 3 mL; Refill: 0  BMI 40.0-44.9, adult (HCC)      Will obtain labs at next visit.    Return in about 4 weeks (around 10/29/2023).Marland Kitchen She was informed of the importance of frequent follow up visits to maximize her success with intensive lifestyle modifications for her multiple health conditions.   ATTESTASTION STATEMENTS:  Reviewed by clinician on day of visit: allergies, medications, problem list, medical history, surgical history, family history, social history, and previous encounter notes.     Theodis Sato. Nasra Counce FNP-C

## 2023-10-12 ENCOUNTER — Telehealth: Payer: No Typology Code available for payment source | Admitting: Physician Assistant

## 2023-10-12 DIAGNOSIS — B001 Herpesviral vesicular dermatitis: Secondary | ICD-10-CM | POA: Diagnosis not present

## 2023-10-12 MED ORDER — ACYCLOVIR 800 MG PO TABS
800.0000 mg | ORAL_TABLET | Freq: Two times a day (BID) | ORAL | 2 refills | Status: AC
  Filled 2023-10-12: qty 14, 7d supply, fill #0
  Filled 2024-01-28: qty 14, 7d supply, fill #1
  Filled 2024-04-27 (×2): qty 14, 7d supply, fill #2

## 2023-10-12 NOTE — Progress Notes (Signed)
E-Visit for [employer]  We are sorry that you are not feeling well.  Here is how we plan to help!  Based on what you have shared with me it does look like you have a viral infection.    Most cold sores or fever blisters are small fluid filled blisters around the mouth caused by herpes simplex virus.  The most common strain of the virus causing cold sores is herpes simplex virus 1.  It can be spread by skin contact, sharing eating utensils, or even sharing towels.  Cold sores are contagious to other people until dry. (Approximately 5-7 days).  Wash your hands. You can spread the virus to your eyes through handling your contact lenses after touching the lesions.  Most people experience pain at the sight or tingling sensations in their lips that may begin before the ulcers erupt.  Herpes simplex is treatable but not curable.  It may lie dormant for a long time and then reappear due to stress or prolonged sun exposure.  Many patients have success in treating their cold sores with an over the counter topical called Abreva.  You may apply the cream up to 5 times daily (maximum 10 days) until healing occurs.  If you would like to use an oral antiviral medication to speed the healing of your cold sore, I have sent a prescription to your local pharmacy Acyclovir 800 mg take one by mouth twice a day for 7 days    HOME CARE:  Wash your hands frequently. Do not pick at or rub the sore. Don't open the blisters. Avoid kissing other people during this time. Avoid sharing drinking glasses, eating utensils, or razors. Do not handle contact lenses unless you have thoroughly washed your hands with soap and warm water! Avoid oral sex during this time.  Herpes from sores on your mouth can spread to your partner's genital area. Avoid contact with anyone who has eczema or a weakened immune system. Cold sores are often triggered by exposure to intense sunlight, use a lip balm containing a sunscreen (SPF 30 or  higher).  GET HELP RIGHT AWAY IF:  Blisters look infected. Blisters occur near or in the eye. Symptoms last longer than 10 days. Your symptoms become worse.  MAKE SURE YOU:  Understand these instructions. Will watch your condition. Will get help right away if you are not doing well or get worse.    Your e-visit answers were reviewed by a board certified advanced clinical practitioner to complete your personal care plan.  Depending upon the condition, your plan could have  Included both over the counter or prescription medications.    Please review your pharmacy choice.  Be sure that the pharmacy you have chosen is open so that you can pick up your prescription now.  If there is a problem you can message your provider in MyChart to have the prescription routed to another pharmacy.    Your safety is important to [place].  If you have drug allergies check our prescription carefully.  For the next 24 hours you can use MyChart to ask questions about today's visit, request a non-urgent call back, or ask for a work or school excuse from your e-visit provider.  You will get an email in the next two days asking about your experience.  I hope that your e-visit has been valuable and will speed your recovery.   I have spent 5 minutes in review of e-visit questionnaire, review and updating patient chart, medical decision  making and response to patient.   Tylene Fantasia Ward, PA-C

## 2023-10-13 ENCOUNTER — Other Ambulatory Visit (HOSPITAL_COMMUNITY): Payer: Self-pay

## 2023-10-13 ENCOUNTER — Other Ambulatory Visit: Payer: Self-pay

## 2023-10-25 ENCOUNTER — Telehealth: Payer: No Typology Code available for payment source | Admitting: Family

## 2023-10-25 DIAGNOSIS — R6889 Other general symptoms and signs: Secondary | ICD-10-CM | POA: Diagnosis not present

## 2023-10-25 MED ORDER — BENZONATATE 100 MG PO CAPS
100.0000 mg | ORAL_CAPSULE | Freq: Three times a day (TID) | ORAL | 0 refills | Status: DC | PRN
  Filled 2023-10-25: qty 20, 7d supply, fill #0

## 2023-10-25 MED ORDER — OSELTAMIVIR PHOSPHATE 75 MG PO CAPS
75.0000 mg | ORAL_CAPSULE | Freq: Two times a day (BID) | ORAL | 0 refills | Status: DC
  Filled 2023-10-25: qty 10, 5d supply, fill #0

## 2023-10-25 NOTE — Progress Notes (Signed)
 E visit for Flu like symptoms   We are sorry that you are not feeling well.  Here is how we plan to help! Based on what you have shared with me it looks like you may have a respiratory virus that may be influenza.  Influenza or "the flu" is   an infection caused by a respiratory virus. The flu virus is highly contagious and persons who did not receive their yearly flu vaccination may "catch" the flu from close contact.  We have anti-viral medications to treat the viruses that cause this infection. They are not a "cure" and only shorten the course of the infection. These prescriptions are most effective when they are given within the first 2 days of "flu" symptoms. Antiviral medication are indicated if you have a high risk of complications from the flu. You should  also consider an antiviral medication if you are in close contact with someone who is at risk. These medications can help patients avoid complications from the flu  but have side effects that you should know. Possible side effects from Tamiflu  or oseltamivir  include nausea, vomiting, diarrhea, dizziness, headaches, eye redness, sleep problems or other respiratory symptoms. You should not take Tamiflu  if you have an allergy to oseltamivir  or any to the ingredients in Tamiflu .  Based upon your symptoms and potential risk factors I have prescribed Oseltamivir  (Tamiflu ).  It has been sent to your designated pharmacy.  You will take one 75 mg capsule orally twice a day for the next 5 days. I have also sent in tessalon  200 mg you can take three times a day as needed.   ANYONE WHO HAS FLU SYMPTOMS SHOULD: Stay home. The flu is highly contagious and going out or to work exposes others! Be sure to drink plenty of fluids. Water is fine as well as fruit juices, sodas and electrolyte beverages. You may want to stay away from caffeine or alcohol. If you are nauseated, try taking small sips of liquids. How do you know if you are getting enough fluid? Your  urine should be a pale yellow or almost colorless. Get rest. Taking a steamy shower or using a humidifier may help nasal congestion and ease sore throat pain. Using a saline nasal spray works much the same way. Cough drops, hard candies and sore throat lozenges may ease your cough. Line up a caregiver. Have someone check on you regularly.   GET HELP RIGHT AWAY IF: You cannot keep down liquids or your medications. You become short of breath Your fell like you are going to pass out or loose consciousness. Your symptoms persist after you have completed your treatment plan MAKE SURE YOU  Understand these instructions. Will watch your condition. Will get help right away if you are not doing well or get worse.  Your e-visit answers were reviewed by a board certified advanced clinical practitioner to complete your personal care plan.  Depending on the condition, your plan could have included both over the counter or prescription medications.  If there is a problem please reply  once you have received a response from your provider.  Your safety is important to us .  If you have drug allergies check your prescription carefully.    You can use MyChart to ask questions about today's visit, request a non-urgent call back, or ask for a work or school excuse for 24 hours related to this e-Visit. If it has been greater than 24 hours you will need to follow up with your provider,  or enter a new e-Visit to address those concerns.  You will get an e-mail in the next two days asking about your experience.  I hope that your e-visit has been valuable and will speed your recovery. Thank you for using e-visits.   Approximately 5 minutes was spent documenting and reviewing patient's chart.

## 2023-10-26 ENCOUNTER — Other Ambulatory Visit (HOSPITAL_COMMUNITY): Payer: Self-pay

## 2023-10-26 ENCOUNTER — Encounter: Payer: Self-pay | Admitting: Hematology

## 2023-10-29 ENCOUNTER — Encounter: Payer: Self-pay | Admitting: Nurse Practitioner

## 2023-10-29 ENCOUNTER — Ambulatory Visit (INDEPENDENT_AMBULATORY_CARE_PROVIDER_SITE_OTHER): Payer: No Typology Code available for payment source | Admitting: Nurse Practitioner

## 2023-10-29 ENCOUNTER — Other Ambulatory Visit (HOSPITAL_COMMUNITY): Payer: Self-pay

## 2023-10-29 VITALS — BP 131/84 | HR 74 | Temp 98.1°F | Ht 69.0 in | Wt 274.0 lb

## 2023-10-29 DIAGNOSIS — E66813 Obesity, class 3: Secondary | ICD-10-CM

## 2023-10-29 DIAGNOSIS — R632 Polyphagia: Secondary | ICD-10-CM | POA: Diagnosis not present

## 2023-10-29 DIAGNOSIS — Z6841 Body Mass Index (BMI) 40.0 and over, adult: Secondary | ICD-10-CM

## 2023-10-29 MED ORDER — WEGOVY 1.7 MG/0.75ML ~~LOC~~ SOAJ
1.7000 mg | SUBCUTANEOUS | 0 refills | Status: DC
  Filled 2023-10-29: qty 3, 28d supply, fill #0

## 2023-10-29 NOTE — Progress Notes (Signed)
Office: [phone]  /  Fax: [phone]  WEIGHT SUMMARY AND BIOMETRICS  Weight Lost Since Last Visit: 4lb  Weight Gained Since Last Visit: 0lb   Vitals Temp: 98.1 F (36.7 C) BP: 131/84 Pulse Rate: 74 SpO2: 99 %   Anthropometric Measurements Height: 5\' 9"  (1.753 m) Weight: 274 lb (124.3 kg) BMI (Calculated): 40.44 Weight at Last Visit: 278lb Weight Lost Since Last Visit: 4lb Weight Gained Since Last Visit: 0lb Starting Weight: 284lb Total Weight Loss (lbs): 10 lb (4.536 kg)   Body Composition  Body Fat %: 43.3 % Fat Mass (lbs): 119 lbs Muscle Mass (lbs): 147.8 lbs Total Body Water (lbs): 101 lbs Visceral Fat Rating : 11   Other Clinical Data Fasting: No Labs: No Today's Visit #: 26 Starting Date: 11/20/21     HPI  Chief Complaint: OBESITY  Leslie Branch is here to discuss her progress with her obesity treatment plan. She is on the keeping a food journal and adhering to recommended goals of 1900 calories and 80+ protein and states she is following her eating plan approximately 75-80 % of the time. She states she is exercising 30+ minutes 5 days per week.   Interval History:  Since last office visit she has lost 4 pounds.  She is not skipping meals. She is not snacking as much.  She recently had the flu and hasn't been able to run as much as she has in the past.  She is scheduled for multiple races.      Pharmacotherapy for weight loss: She is currently taking Wegovy 1.7mg  for medical weight loss.  Denies side effects. Has helped with polyphagia and cravings.   Doing well.     Approval dates: 06/01/2023 to 11/29/2023    Previous pharmacotherapy for medical weight loss:  Ozempic-stopped due to cost     Bariatric surgery:  Patient has not had bariatric surgery.        PHYSICAL EXAM:  Blood pressure 131/84, pulse 74, temperature 98.1 F (36.7 C), height 5\' 9"  (1.753 m), weight 274 lb (124.3 kg), SpO2 99%. Body mass index is 40.46 kg/m.  General: She  is overweight, cooperative, alert, well developed, and in no acute distress. PSYCH: Has normal mood, affect and thought process.   Extremities: No edema.  Neurologic: No gross sensory or motor deficits. No tremors or fasciculations noted.    DIAGNOSTIC DATA REVIEWED:  BMET    Component Value Date/Time   NA 139 02/12/2023 1037   NA 140 08/21/2016 1328   K 4.4 02/12/2023 1037   K 4.0 08/21/2016 1328   CL 102 02/12/2023 1037   CO2 22 02/12/2023 1037   CO2 23 08/21/2016 1328   GLUCOSE 81 02/12/2023 1037   GLUCOSE 113 (H) 07/21/2019 0947   GLUCOSE 92 08/21/2016 1328   BUN 17 02/12/2023 1037   BUN 11.7 08/21/2016 1328   CREATININE 0.90 02/12/2023 1037   CREATININE 0.65 11/01/2018 0830   CREATININE 0.7 08/21/2016 1328   CALCIUM 9.1 02/12/2023 1037   CALCIUM 8.7 08/21/2016 1328   GFRNONAA >60 07/21/2019 0947   GFRNONAA >60 11/01/2018 0830   GFRAA >60 07/21/2019 0947   GFRAA >60 11/01/2018 0830   Lab Results  Component Value Date   HGBA1C 5.5 02/12/2023   HGBA1C 5.8 (H) 04/17/2021   Lab Results  Component Value Date   INSULIN 22.5 07/30/2022   INSULIN 17.3 11/20/2021   Lab Results  Component Value Date   TSH 1.560 07/30/2022   CBC    Component  Value Date/Time   WBC 10.2 07/30/2022 1138   WBC 9.2 07/21/2019 0947   RBC 5.01 07/30/2022 1138   RBC 4.37 07/21/2019 0947   HGB 14.1 07/30/2022 1138   HGB 7.0 (L) 08/21/2016 1328   HCT 42.5 07/30/2022 1138   HCT 22.5 (L) 08/21/2016 1328   PLT 317 07/30/2022 1138   MCV 85 07/30/2022 1138   MCV 74.8 (L) 08/21/2016 1328   MCH 28.1 07/30/2022 1138   MCH 24.3 (L) 07/21/2019 0947   MCHC 33.2 07/30/2022 1138   MCHC 30.5 07/21/2019 0947   RDW 13.5 07/30/2022 1138   RDW 14.2 08/21/2016 1328   Iron Studies    Component Value Date/Time   IRON 61 03/24/2022 1021   IRON 14 (L) 08/21/2016 1328   TIBC 428 08/09/2018 0943   TIBC 459 (H) 08/21/2016 1328   FERRITIN 30 08/09/2018 0943   FERRITIN 10 08/21/2016 1328   IRONPCTSAT  17 (L) 08/09/2018 0943   IRONPCTSAT 3 (L) 08/21/2016 1328   Lipid Panel     Component Value Date/Time   CHOL 167 02/12/2023 1037   TRIG 150 (H) 02/12/2023 1037   HDL 41 02/12/2023 1037   LDLCALC 100 (H) 02/12/2023 1037   Hepatic Function Panel     Component Value Date/Time   PROT 6.6 02/12/2023 1037   PROT 6.7 08/21/2016 1328   ALBUMIN 4.3 02/12/2023 1037   ALBUMIN 3.6 08/21/2016 1328   AST 16 02/12/2023 1037   AST 10 (L) 11/01/2018 0830   AST 60 (H) 08/21/2016 1328   ALT 16 02/12/2023 1037   ALT 11 11/01/2018 0830   ALT 87 (H) 08/21/2016 1328   ALKPHOS 62 02/12/2023 1037   ALKPHOS 70 08/21/2016 1328   BILITOT 0.4 02/12/2023 1037   BILITOT 0.2 (L) 11/01/2018 0830   BILITOT 0.54 08/21/2016 1328   BILIDIR <0.1 07/21/2019 0947   IBILI NOT CALCULATED 07/21/2019 0947      Component Value Date/Time   TSH 1.560 07/30/2022 1138   Nutritional Lab Results  Component Value Date   VD25OH 29.7 (L) 02/12/2023   VD25OH 27.3 (L) 07/30/2022   VD25OH 27.9 (L) 03/24/2022     ASSESSMENT AND PLAN  TREATMENT PLAN FOR OBESITY:  Recommended Dietary Goals  Leslie Branch is currently in the action stage of change. As such, her goal is to continue weight management plan. She has agreed to keeping a food journal and adhering to recommended goals of 1900 calories and 100+ grams protein.  Behavioral Intervention  We discussed the following Behavioral Modification Strategies today: increasing lean protein intake to established goals, increasing fiber rich foods, increasing water intake , continue to practice mindfulness when eating, planning for success, and continue to work on maintaining a reduced calorie state, getting the recommended amount of protein, incorporating whole foods, making healthy choices, staying well hydrated and practicing mindfulness when eating..  Additional resources provided today: NA  Recommended Physical Activity Goals  Leslie Branch has been advised to work up to 150 minutes  of moderate intensity aerobic activity a week and strengthening exercises 2-3 times per week for cardiovascular health, weight loss maintenance and preservation of muscle mass.   She has agreed to Continue current level of physical activity    Pharmacotherapy We discussed various medication options to help [place] with her weight loss efforts and we both agreed to continue [facility] 1.7mg .  Side effects discussed.  ASSOCIATED CONDITIONS ADDRESSED TODAY  Action/Plan  Polyphagia -     Continue [id number]; Inject 1.7 mg into the  skin once a week.  Dispense: 3 mL; Refill: 0.  Side effects discussed.   Class 3 severe obesity due to excess calories without serious comorbidity with body mass index (BMI) of 40.0 to 44.9 in adult [facility]) -     [id number]; Inject 1.7 mg into the skin once a week.  Dispense: 3 mL; Refill: 0     Will obtain labs at next visit-patient is not fasting today.     Return in about 4 weeks (around 11/26/2023).Marland Kitchen She was informed of the importance of frequent follow up visits to maximize her success with intensive lifestyle modifications for her multiple health conditions.   ATTESTASTION STATEMENTS:  Reviewed by clinician on day of visit: allergies, medications, problem list, medical history, surgical history, family history, social history, and previous encounter notes.     Theodis Sato. Quincy Prisco FNP-C

## 2023-11-03 ENCOUNTER — Ambulatory Visit: Payer: No Typology Code available for payment source | Admitting: Nurse Practitioner

## 2023-11-05 ENCOUNTER — Other Ambulatory Visit (HOSPITAL_COMMUNITY): Payer: Self-pay

## 2023-11-17 ENCOUNTER — Other Ambulatory Visit (HOSPITAL_COMMUNITY): Payer: Self-pay

## 2023-11-23 ENCOUNTER — Ambulatory Visit (INDEPENDENT_AMBULATORY_CARE_PROVIDER_SITE_OTHER): Payer: No Typology Code available for payment source | Admitting: Nurse Practitioner

## 2023-11-23 ENCOUNTER — Other Ambulatory Visit (HOSPITAL_COMMUNITY): Payer: Self-pay

## 2023-11-23 ENCOUNTER — Encounter: Payer: Self-pay | Admitting: Nurse Practitioner

## 2023-11-23 VITALS — BP 137/79 | HR 60 | Temp 97.8°F | Ht 69.0 in | Wt 273.0 lb

## 2023-11-23 DIAGNOSIS — Z79899 Other long term (current) drug therapy: Secondary | ICD-10-CM

## 2023-11-23 DIAGNOSIS — E559 Vitamin D deficiency, unspecified: Secondary | ICD-10-CM

## 2023-11-23 DIAGNOSIS — I10 Essential (primary) hypertension: Secondary | ICD-10-CM

## 2023-11-23 DIAGNOSIS — R7303 Prediabetes: Secondary | ICD-10-CM

## 2023-11-23 DIAGNOSIS — E7849 Other hyperlipidemia: Secondary | ICD-10-CM | POA: Diagnosis not present

## 2023-11-23 DIAGNOSIS — E66813 Obesity, class 3: Secondary | ICD-10-CM

## 2023-11-23 DIAGNOSIS — R632 Polyphagia: Secondary | ICD-10-CM

## 2023-11-23 DIAGNOSIS — Z6841 Body Mass Index (BMI) 40.0 and over, adult: Secondary | ICD-10-CM

## 2023-11-23 MED ORDER — WEGOVY 2.4 MG/0.75ML ~~LOC~~ SOAJ
2.4000 mg | SUBCUTANEOUS | 0 refills | Status: DC
  Filled 2023-11-23: qty 3, 28d supply, fill #0

## 2023-11-23 NOTE — Progress Notes (Signed)
 Office: [phone]  /  Fax: [phone]  WEIGHT SUMMARY AND BIOMETRICS  Weight Lost Since Last Visit: 1 lb  Weight Gained Since Last Visit: 0   Vitals Temp: 97.8 F (36.6 C) BP: 137/79 Pulse Rate: 60 SpO2: 100 %   Anthropometric Measurements Height: 5\' 9"  (1.753 m) Weight: 273 lb (123.8 kg) BMI (Calculated): 40.3 Weight at Last Visit: 274 lb Weight Lost Since Last Visit: 1 lb Weight Gained Since Last Visit: 0 Starting Weight: 284  lb Total Weight Loss (lbs): 11 lb (4.99 kg) Peak Weight: 300 lb   Body Composition  Body Fat %: 44 % Fat Mass (lbs): 120.2 lbs Muscle Mass (lbs): 145.4 lbs Total Body Water (lbs): 100.2 lbs Visceral Fat Rating : 12   Other Clinical Data Fasting: yes Labs: no Today's Visit #: 27 Starting Date: 11/20/21     HPI  Chief Complaint: OBESITY  Via is here to discuss her progress with her obesity treatment plan. She is on the keeping a food journal and adhering to recommended goals of 1900 calories and 60-70 protein and states she is following her eating plan approximately 90 % of the time. She states she is exercising running 3 times weekly and some weights 30 minutes 3 days per week.   Interval History:  Since last office visit she has lost 1 pound.  She is making a gym at her house and is hoping she will be able to start using it in April.  She is scheduled for a half marathon next week, 10 k at the end of the month, [place] 10 mile in April, 50k end of April and another 50k in May. Notes some polyphagia and cravings.    BF:  protein powder with fairlife milk with a fruit Snack:  trailmix or cereal  Lunch:  [employer] [place] sandwich or wrap  Snack:  none Dinner:  protein with a vegetable Drinks: water, protein shake, electrolyte   Pharmacotherapy for weight loss: She is currently taking Wegovy 1.7mg  for medical weight loss.  Denies side effects.   Approval dates: 06/01/2023 to 11/29/2023    Previous  pharmacotherapy for medical weight loss:  Ozempic-stopped due to cost     Bariatric surgery:  Patient has not had bariatric surgery.      Vit D deficiency  She is taking Vit D 50,000 IU weekly.  Denies side effects.  Denies nausea, vomiting or muscle weakness.    Lab Results  Component Value Date   VD25OH 29.7 (L) 02/12/2023   VD25OH 27.3 (L) 07/30/2022   VD25OH 27.9 (L) 03/24/2022    Prediabetes Last A1c was 5.5 and insulin was 22.5 Medication(s): Wegovy 1.7 SQ weekly Polyphagia:Yes Lab Results  Component Value Date   HGBA1C 5.5 02/12/2023   HGBA1C 5.8 (H) 07/30/2022   HGBA1C 5.7 (H) 03/24/2022   HGBA1C 5.8 (H) 04/17/2021   Lab Results  Component Value Date   INSULIN 22.5 07/30/2022   INSULIN 19.7 03/24/2022   INSULIN 17.3 11/20/2021   Hypertension Hypertension stable.  Medication(s): Cozaar 25mg . Denies side effects.  Denies chest pain, palpitations and SOB.  BP Readings from Last 3 Encounters:  11/23/23 137/79  10/29/23 131/84  10/01/23 132/85   Lab Results  Component Value Date   CREATININE 0.90 02/12/2023   CREATININE 0.72 07/30/2022   CREATININE 0.68 03/24/2022    Hyperlipidemia Medication(s): None. Cardiovascular risk factors: dyslipidemia, hypertension, and obesity (BMI >= 30 kg/m2)  Lab Results  Component Value Date   CHOL 167 02/12/2023  HDL 41 02/12/2023   LDLCALC 100 (H) 02/12/2023   TRIG 150 (H) 02/12/2023   Lab Results  Component Value Date   ALT 16 02/12/2023   AST 16 02/12/2023   ALKPHOS 62 02/12/2023   BILITOT 0.4 02/12/2023   The ASCVD Risk score (Arnett DK, et al., 2019) failed to calculate for the following reasons:   The 2019 ASCVD risk score is only valid for ages 51 to 95    PHYSICAL EXAM:  Blood pressure 137/79, pulse 60, temperature 97.8 F (36.6 C), height 5\' 9"  (1.753 m), weight 273 lb (123.8 kg), SpO2 100%. Body mass index is 40.32 kg/m.  General: She is overweight, cooperative, alert, well developed, and in no  acute distress. PSYCH: Has normal mood, affect and thought process.   Extremities: No edema.  Neurologic: No gross sensory or motor deficits. No tremors or fasciculations noted.    DIAGNOSTIC DATA REVIEWED:  BMET    Component Value Date/Time   NA 139 02/12/2023 1037   NA 140 08/21/2016 1328   K 4.4 02/12/2023 1037   K 4.0 08/21/2016 1328   CL 102 02/12/2023 1037   CO2 22 02/12/2023 1037   CO2 23 08/21/2016 1328   GLUCOSE 81 02/12/2023 1037   GLUCOSE 113 (H) 07/21/2019 0947   GLUCOSE 92 08/21/2016 1328   BUN 17 02/12/2023 1037   BUN 11.7 08/21/2016 1328   CREATININE 0.90 02/12/2023 1037   CREATININE 0.65 11/01/2018 0830   CREATININE 0.7 08/21/2016 1328   CALCIUM 9.1 02/12/2023 1037   CALCIUM 8.7 08/21/2016 1328   GFRNONAA >60 07/21/2019 0947   GFRNONAA >60 11/01/2018 0830   GFRAA >60 07/21/2019 0947   GFRAA >60 11/01/2018 0830   Lab Results  Component Value Date   HGBA1C 5.5 02/12/2023   HGBA1C 5.8 (H) 04/17/2021   Lab Results  Component Value Date   INSULIN 22.5 07/30/2022   INSULIN 17.3 11/20/2021   Lab Results  Component Value Date   TSH 1.560 07/30/2022   CBC    Component Value Date/Time   WBC 10.2 07/30/2022 1138   WBC 9.2 07/21/2019 0947   RBC 5.01 07/30/2022 1138   RBC 4.37 07/21/2019 0947   HGB 14.1 07/30/2022 1138   HGB 7.0 (L) 08/21/2016 1328   HCT 42.5 07/30/2022 1138   HCT 22.5 (L) 08/21/2016 1328   PLT 317 07/30/2022 1138   MCV 85 07/30/2022 1138   MCV 74.8 (L) 08/21/2016 1328   MCH 28.1 07/30/2022 1138   MCH 24.3 (L) 07/21/2019 0947   MCHC 33.2 07/30/2022 1138   MCHC 30.5 07/21/2019 0947   RDW 13.5 07/30/2022 1138   RDW 14.2 08/21/2016 1328   Iron Studies    Component Value Date/Time   IRON 61 03/24/2022 1021   IRON 14 (L) 08/21/2016 1328   TIBC 428 08/09/2018 0943   TIBC 459 (H) 08/21/2016 1328   FERRITIN 30 08/09/2018 0943   FERRITIN 10 08/21/2016 1328   IRONPCTSAT 17 (L) 08/09/2018 0943   IRONPCTSAT 3 (L) 08/21/2016 1328    Lipid Panel     Component Value Date/Time   CHOL 167 02/12/2023 1037   TRIG 150 (H) 02/12/2023 1037   HDL 41 02/12/2023 1037   LDLCALC 100 (H) 02/12/2023 1037   Hepatic Function Panel     Component Value Date/Time   PROT 6.6 02/12/2023 1037   PROT 6.7 08/21/2016 1328   ALBUMIN 4.3 02/12/2023 1037   ALBUMIN 3.6 08/21/2016 1328   AST 16 02/12/2023 1037  AST 10 (L) 11/01/2018 0830   AST 60 (H) 08/21/2016 1328   ALT 16 02/12/2023 1037   ALT 11 11/01/2018 0830   ALT 87 (H) 08/21/2016 1328   ALKPHOS 62 02/12/2023 1037   ALKPHOS 70 08/21/2016 1328   BILITOT 0.4 02/12/2023 1037   BILITOT 0.2 (L) 11/01/2018 0830   BILITOT 0.54 08/21/2016 1328   BILIDIR <0.1 07/21/2019 0947   IBILI NOT CALCULATED 07/21/2019 0947      Component Value Date/Time   TSH 1.560 07/30/2022 1138   Nutritional Lab Results  Component Value Date   VD25OH 29.7 (L) 02/12/2023   VD25OH 27.3 (L) 07/30/2022   VD25OH 27.9 (L) 03/24/2022     ASSESSMENT AND PLAN  TREATMENT PLAN FOR OBESITY:  Recommended Dietary Goals  Anacaren is currently in the action stage of change. As such, her goal is to continue weight management plan. She has agreed to keeping a food journal and adhering to recommended goals of 2000 calories and 130 grams protein.  Behavioral Intervention  We discussed the following Behavioral Modification Strategies today: increasing lean protein intake to established goals, decreasing simple carbohydrates , increasing vegetables, increasing fiber rich foods, increasing water intake , work on tracking and journaling calories using tracking application, and continue to work on maintaining a reduced calorie state, getting the recommended amount of protein, incorporating whole foods, making healthy choices, staying well hydrated and practicing mindfulness when eating..  Additional resources provided today: NA  Recommended Physical Activity Goals  Gwendalyn has been advised to work up to 150 minutes  of moderate intensity aerobic activity a week and strengthening exercises 2-3 times per week for cardiovascular health, weight loss maintenance and preservation of muscle mass.   She has agreed to Continue current level of physical activity and increase resistance training.    Pharmacotherapy We discussed various medication options to help Aireonna with her weight loss efforts and we both agreed to increase Wegovy 2.4mg .  Side effects discussed.  Patient has an IUD.    ASSOCIATED CONDITIONS ADDRESSED TODAY  Action/Plan  Vitamin D deficiency -     VITAMIN D 25 Hydroxy (Vit-D Deficiency, Fractures)  Continue Vit D as directed  Will continue to monitor.    Pre-diabetes -     Insulin, random -     Hemoglobin A1c  Continue Wegovy, increase to 2.4mg .  side effects discussed  Essential hypertension -     CBC with Differential/Platelet  Continue to follow up with PCP. Continue meds as directed  Other hyperlipidemia -     Lipid Panel With LDL/HDL Ratio  Cardiovascular risk and specific lipid/LDL goals reviewed.  We discussed several lifestyle modifications today and Soledad will continue to work on diet, exercise and weight loss efforts. Orders and follow up as documented in patient record.   Counseling Intensive lifestyle modifications are the first line treatment for this issue. Dietary changes: Increase soluble fiber. Decrease simple carbohydrates. Exercise changes: Moderate to vigorous-intensity aerobic activity 150 minutes per week if tolerated. Lipid-lowering medications: see documented in medical record.   Polyphagia -     [id number]; Inject 2.4 mg into the skin once a week.  Dispense: 3 mL; Refill: 0  Class 3 severe obesity due to excess calories without serious comorbidity with body mass index (BMI) of 40.0 to 44.9 in adult [facility]) -     [id number]; Inject 2.4 mg into the skin once a week.  Dispense: 3 mL; Refill: 0  Medication management -     Comprehensive metabolic panel -  TSH -     CBC with Differential/Platelet         Return in about 4 weeks (around 12/21/2023).Marland Kitchen She was informed of the importance of frequent follow up visits to maximize her success with intensive lifestyle modifications for her multiple health conditions.   ATTESTASTION STATEMENTS:  Reviewed by clinician on day of visit: allergies, medications, problem list, medical history, surgical history, family history, social history, and previous encounter notes.     Theodis Sato. Halei Hanover FNP-C

## 2023-11-24 LAB — LIPID PANEL WITH LDL/HDL RATIO
Cholesterol, Total: 154 mg/dL (ref 100–199)
HDL: 40 mg/dL (ref >39)
LDL Chol Calc (NIH): 97 mg/dL (ref 0–99)
LDL/HDL Ratio: 2.4 ratio (ref 0.0–3.2)
Triglycerides: 87 mg/dL (ref 0–149)
VLDL Cholesterol Cal: 17 mg/dL (ref 5–40)

## 2023-11-24 LAB — CBC WITH DIFFERENTIAL/PLATELET
Basophils Absolute: 0.1 10*3/uL (ref 0.0–0.2)
Basos: 1 %
EOS (ABSOLUTE): 0.5 10*3/uL — ABNORMAL HIGH (ref 0.0–0.4)
Eos: 6 %
Hematocrit: 38.3 % (ref 34.0–46.6)
Hemoglobin: 13.3 g/dL (ref 11.1–15.9)
Immature Grans (Abs): 0 10*3/uL (ref 0.0–0.1)
Immature Granulocytes: 0 %
Lymphocytes Absolute: 2.8 10*3/uL (ref 0.7–3.1)
Lymphs: 30 %
MCH: 29.8 pg (ref 26.6–33.0)
MCHC: 34.7 g/dL (ref 31.5–35.7)
MCV: 86 fL (ref 79–97)
Monocytes Absolute: 0.6 10*3/uL (ref 0.1–0.9)
Monocytes: 6 %
Neutrophils Absolute: 5.3 10*3/uL (ref 1.4–7.0)
Neutrophils: 57 %
Platelets: 290 10*3/uL (ref 150–450)
RBC: 4.46 x10E6/uL (ref 3.77–5.28)
RDW: 12.1 % (ref 11.7–15.4)
WBC: 9.2 10*3/uL (ref 3.4–10.8)

## 2023-11-24 LAB — COMPREHENSIVE METABOLIC PANEL
ALT: 17 IU/L (ref 0–32)
AST: 22 IU/L (ref 0–40)
Albumin: 4.4 g/dL (ref 3.9–4.9)
Alkaline Phosphatase: 63 IU/L (ref 44–121)
BUN/Creatinine Ratio: 13 (ref 9–23)
BUN: 9 mg/dL (ref 6–20)
Bilirubin Total: 0.6 mg/dL (ref 0.0–1.2)
CO2: 21 mmol/L (ref 20–29)
Calcium: 8.8 mg/dL (ref 8.7–10.2)
Chloride: 104 mmol/L (ref 96–106)
Creatinine, Ser: 0.68 mg/dL (ref 0.57–1.00)
Globulin, Total: 2.2 g/dL (ref 1.5–4.5)
Glucose: 85 mg/dL (ref 70–99)
Potassium: 4.3 mmol/L (ref 3.5–5.2)
Sodium: 138 mmol/L (ref 134–144)
Total Protein: 6.6 g/dL (ref 6.0–8.5)
eGFR: 114 mL/min/{1.73_m2} (ref >59)

## 2023-11-24 LAB — VITAMIN D 25 HYDROXY (VIT D DEFICIENCY, FRACTURES): Vit D, 25-Hydroxy: 34.7 ng/mL (ref 30.0–100.0)

## 2023-11-24 LAB — HEMOGLOBIN A1C
Est. average glucose Bld gHb Est-mCnc: 100 mg/dL
Hgb A1c MFr Bld: 5.1 % (ref 4.8–5.6)

## 2023-11-24 LAB — TSH: TSH: 1.85 u[IU]/mL (ref 0.450–4.500)

## 2023-11-24 LAB — INSULIN, RANDOM: INSULIN: 24.5 u[IU]/mL (ref 2.6–24.9)

## 2023-12-30 ENCOUNTER — Telehealth: Payer: Self-pay

## 2023-12-30 ENCOUNTER — Ambulatory Visit (INDEPENDENT_AMBULATORY_CARE_PROVIDER_SITE_OTHER): Admitting: Nurse Practitioner

## 2023-12-30 ENCOUNTER — Other Ambulatory Visit (HOSPITAL_COMMUNITY): Payer: Self-pay

## 2023-12-30 ENCOUNTER — Encounter: Payer: Self-pay | Admitting: Hematology

## 2023-12-30 ENCOUNTER — Encounter: Payer: Self-pay | Admitting: Nurse Practitioner

## 2023-12-30 VITALS — BP 132/89 | HR 67 | Temp 98.0°F | Ht 69.0 in | Wt 269.0 lb

## 2023-12-30 DIAGNOSIS — I10 Essential (primary) hypertension: Secondary | ICD-10-CM | POA: Diagnosis not present

## 2023-12-30 DIAGNOSIS — E66813 Obesity, class 3: Secondary | ICD-10-CM | POA: Diagnosis not present

## 2023-12-30 DIAGNOSIS — Z6841 Body Mass Index (BMI) 40.0 and over, adult: Secondary | ICD-10-CM

## 2023-12-30 DIAGNOSIS — R632 Polyphagia: Secondary | ICD-10-CM | POA: Diagnosis not present

## 2023-12-30 DIAGNOSIS — E559 Vitamin D deficiency, unspecified: Secondary | ICD-10-CM | POA: Diagnosis not present

## 2023-12-30 MED ORDER — WEGOVY 2.4 MG/0.75ML ~~LOC~~ SOAJ
2.4000 mg | SUBCUTANEOUS | 0 refills | Status: DC
  Filled 2023-12-30 – 2024-01-01 (×2): qty 3, 28d supply, fill #0

## 2023-12-30 MED ORDER — VITAMIN D (ERGOCALCIFEROL) 1.25 MG (50000 UNIT) PO CAPS
50000.0000 [IU] | ORAL_CAPSULE | ORAL | 0 refills | Status: DC
  Filled 2023-12-30: qty 5, 35d supply, fill #0

## 2023-12-30 MED ORDER — LOSARTAN POTASSIUM 50 MG PO TABS
50.0000 mg | ORAL_TABLET | Freq: Every day | ORAL | 0 refills | Status: DC
  Filled 2023-12-30: qty 30, 30d supply, fill #0

## 2023-12-30 NOTE — Telephone Encounter (Signed)
 PA submitted through [employer] portal for [facility]. Awaiting insurance determination. Prior Auth (EOC) ID: [id number] Drug/Service Name: Frederik Jansky 2.4 MG/0.75 ML PEN Patient: Leslie Branch Date Requested: 12/30/2023 1:14:43 PM   MemberID: [id number] DOB: 05-20-1986

## 2023-12-30 NOTE — Progress Notes (Signed)
 Office: [phone]  /  Fax: [phone]  WEIGHT SUMMARY AND BIOMETRICS  Weight Lost Since Last Visit: 4lb  Weight Gained Since Last Visit: 0lb   Vitals Temp: 98 F (36.7 C) BP: 132/89 Pulse Rate: 67 SpO2: 98 %   Anthropometric Measurements Height: 5\' 9"  (1.753 m) Weight: 269 lb (122 kg) BMI (Calculated): 39.71 Weight at Last Visit: 273lb Weight Lost Since Last Visit: 4lb Weight Gained Since Last Visit: 0lb Starting Weight: 284lb Total Weight Loss (lbs): 15 lb (6.804 kg)   Body Composition  Body Fat %: 42.7 % Fat Mass (lbs): 115 lbs Muscle Mass (lbs): 146.4 lbs Total Body Water (lbs): 99 lbs Visceral Fat Rating : 11   Other Clinical Data Fasting: No Labs: No Today's Visit #: 28 Starting Date: 11/20/21     HPI  Chief Complaint: OBESITY  Emalia is here to discuss her progress with her obesity treatment plan. She is on the keeping a food journal and adhering to recommended goals of 1900 calories and 60-70 protein and states she is following her eating plan approximately 90 % of the time. She states she is exercising 60 minutes 3-4 days per week.   Interval History:  Since last office visit she has lost 4 pounds.  She has lost >5% of her body weight since starting Wegovy.  Overall doing well.  Continues to be active, running several days per week.  Has several upcoming races.    Pharmacotherapy for weight loss: She is currently taking Wegovy 2.4 mg for medical weight loss.  Denies side effects.   Approval dates: 06/01/2023 to 11/29/2023    Previous pharmacotherapy for medical weight loss:  Ozempic-stopped due to cost     Bariatric surgery:  Patient has not had bariatric surgery.       Vit D deficiency  She is taking Vit D 50,000 IU weekly.  Denies side effects.  Denies nausea, vomiting or muscle weakness.    Lab Results  Component Value Date   VD25OH 34.7 11/23/2023   VD25OH 29.7 (L) 02/12/2023   VD25OH 27.3 (L) 07/30/2022     Hypertension Hypertension stable.  Medication(s): Cozaar 50mg .  Denies side effects.   Denies chest pain, palpitations and SOB.  BP Readings from Last 3 Encounters:  12/30/23 132/89  11/23/23 137/79  10/29/23 131/84   Lab Results  Component Value Date   CREATININE 0.68 11/23/2023   CREATININE 0.90 02/12/2023   CREATININE 0.72 07/30/2022    PHYSICAL EXAM:  Blood pressure 132/89, pulse 67, temperature 98 F (36.7 C), height 5\' 9"  (1.753 m), weight 269 lb (122 kg), SpO2 98%. Body mass index is 39.72 kg/m.  General: She is overweight, cooperative, alert, well developed, and in no acute distress. PSYCH: Has normal mood, affect and thought process.   Extremities: No edema.  Neurologic: No gross sensory or motor deficits. No tremors or fasciculations noted.    DIAGNOSTIC DATA REVIEWED:  BMET    Component Value Date/Time   NA 138 11/23/2023 1059   NA 140 08/21/2016 1328   K 4.3 11/23/2023 1059   K 4.0 08/21/2016 1328   CL 104 11/23/2023 1059   CO2 21 11/23/2023 1059   CO2 23 08/21/2016 1328   GLUCOSE 85 11/23/2023 1059   GLUCOSE 113 (H) 07/21/2019 0947   GLUCOSE 92 08/21/2016 1328   BUN 9 11/23/2023 1059   BUN 11.7 08/21/2016 1328   CREATININE 0.68 11/23/2023 1059   CREATININE 0.65 11/01/2018 0830   CREATININE 0.7 08/21/2016 1328  CALCIUM 8.8 11/23/2023 1059   CALCIUM 8.7 08/21/2016 1328   GFRNONAA >60 07/21/2019 0947   GFRNONAA >60 11/01/2018 0830   GFRAA >60 07/21/2019 0947   GFRAA >60 11/01/2018 0830   Lab Results  Component Value Date   HGBA1C 5.1 11/23/2023   HGBA1C 5.8 (H) 04/17/2021   Lab Results  Component Value Date   INSULIN 24.5 11/23/2023   INSULIN 17.3 11/20/2021   Lab Results  Component Value Date   TSH 1.850 11/23/2023   CBC    Component Value Date/Time   WBC 9.2 11/23/2023 1059   WBC 9.2 07/21/2019 0947   RBC 4.46 11/23/2023 1059   RBC 4.37 07/21/2019 0947   HGB 13.3 11/23/2023 1059   HGB 7.0 (L) 08/21/2016 1328   HCT 38.3  11/23/2023 1059   HCT 22.5 (L) 08/21/2016 1328   PLT 290 11/23/2023 1059   MCV 86 11/23/2023 1059   MCV 74.8 (L) 08/21/2016 1328   MCH 29.8 11/23/2023 1059   MCH 24.3 (L) 07/21/2019 0947   MCHC 34.7 11/23/2023 1059   MCHC 30.5 07/21/2019 0947   RDW 12.1 11/23/2023 1059   RDW 14.2 08/21/2016 1328   Iron Studies    Component Value Date/Time   IRON 61 03/24/2022 1021   IRON 14 (L) 08/21/2016 1328   TIBC 428 08/09/2018 0943   TIBC 459 (H) 08/21/2016 1328   FERRITIN 30 08/09/2018 0943   FERRITIN 10 08/21/2016 1328   IRONPCTSAT 17 (L) 08/09/2018 0943   IRONPCTSAT 3 (L) 08/21/2016 1328   Lipid Panel     Component Value Date/Time   CHOL 154 11/23/2023 1059   TRIG 87 11/23/2023 1059   HDL 40 11/23/2023 1059   LDLCALC 97 11/23/2023 1059   Hepatic Function Panel     Component Value Date/Time   PROT 6.6 11/23/2023 1059   PROT 6.7 08/21/2016 1328   ALBUMIN 4.4 11/23/2023 1059   ALBUMIN 3.6 08/21/2016 1328   AST 22 11/23/2023 1059   AST 10 (L) 11/01/2018 0830   AST 60 (H) 08/21/2016 1328   ALT 17 11/23/2023 1059   ALT 11 11/01/2018 0830   ALT 87 (H) 08/21/2016 1328   ALKPHOS 63 11/23/2023 1059   ALKPHOS 70 08/21/2016 1328   BILITOT 0.6 11/23/2023 1059   BILITOT 0.2 (L) 11/01/2018 0830   BILITOT 0.54 08/21/2016 1328   BILIDIR <0.1 07/21/2019 0947   IBILI NOT CALCULATED 07/21/2019 0947      Component Value Date/Time   TSH 1.850 11/23/2023 1059   Nutritional Lab Results  Component Value Date   VD25OH 34.7 11/23/2023   VD25OH 29.7 (L) 02/12/2023   VD25OH 27.3 (L) 07/30/2022     ASSESSMENT AND PLAN  TREATMENT PLAN FOR OBESITY:  Recommended Dietary Goals  Sincere is currently in the action stage of change. As such, her goal is to continue weight management plan. She has agreed to keeping a food journal and adhering to recommended goals of 1900 calories and 110+ grams protein.  Behavioral Intervention  We discussed the following Behavioral Modification Strategies  today: increasing lean protein intake to established goals, decreasing simple carbohydrates , increasing vegetables, increasing fiber rich foods, increasing water intake , reading food labels , keeping healthy foods at home, and continue to work on maintaining a reduced calorie state, getting the recommended amount of protein, incorporating whole foods, making healthy choices, staying well hydrated and practicing mindfulness when eating..  Additional resources provided today: NA  Recommended Physical Activity Goals  Miriah has been  advised to work up to 150 minutes of moderate intensity aerobic activity a week and strengthening exercises 2-3 times per week for cardiovascular health, weight loss maintenance and preservation of muscle mass.   She has agreed to Continue current level of physical activity    Pharmacotherapy We discussed various medication options to help Markeia with her weight loss efforts and we both agreed to continue Wegovy 2.4 mg.  Side effects discussed.  ASSOCIATED CONDITIONS ADDRESSED TODAY  Action/Plan  Vitamin D deficiency -     Vitamin D (Ergocalciferol); Take 1 capsule (50,000 Units total) by mouth every 7 (seven) days.  Dispense: 5 capsule; Refill: 0  Essential hypertension -     Losartan Potassium; Take 1 tablet (50 mg total) by mouth daily.  Dispense: 30 tablet; Refill: 0  Polyphagia -     [id number]; Inject 2.4 mg into the skin once a week.  Dispense: 3 mL; Refill: 0  Class 3 severe obesity due to excess calories without serious comorbidity with body mass index (BMI) of 40.0 to 44.9 in adult [facility]) -     ZOXWRU; Inject 2.4 mg into the skin once a week.  Dispense: 3 mL; Refill: 0  Has overall done well with weight loss.  Her muscle mass has increased by 1 pound since her last visit, body fat percentage has decreased and fat mass continues to decrease.  Labs reviewed in chart with patient on 11/23/23   Return in about 4 weeks (around 01/27/2024).Aaron Aas She was informed  of the importance of frequent follow up visits to maximize her success with intensive lifestyle modifications for her multiple health conditions.   ATTESTASTION STATEMENTS:  Reviewed by clinician on day of visit: allergies, medications, problem list, medical history, surgical history, family history, social history, and previous encounter notes.    Crist Dominion. Aolanis Crispen FNP-C

## 2023-12-31 ENCOUNTER — Telehealth: Payer: Self-pay

## 2023-12-31 NOTE — Telephone Encounter (Signed)
 PA forms and medical records faxed to [facility] for [place] at [phone].

## 2023-12-31 NOTE — Telephone Encounter (Signed)
 Received fax from [employer] that the [facility] has been denied.  "This member's plan does not provide benefit coverage for this class of medication"

## 2023-12-31 NOTE — Telephone Encounter (Signed)
 Received fax from [facility] that Leslie Branch has been approved from 12/31/23-12/30/24.

## 2024-01-01 ENCOUNTER — Other Ambulatory Visit (HOSPITAL_COMMUNITY): Payer: Self-pay

## 2024-01-28 ENCOUNTER — Other Ambulatory Visit: Payer: Self-pay

## 2024-01-28 ENCOUNTER — Ambulatory Visit: Admitting: Nurse Practitioner

## 2024-01-28 ENCOUNTER — Other Ambulatory Visit (HOSPITAL_COMMUNITY): Payer: Self-pay

## 2024-01-28 ENCOUNTER — Encounter: Payer: Self-pay | Admitting: Nurse Practitioner

## 2024-01-28 VITALS — BP 139/84 | HR 60 | Temp 97.9°F | Ht 69.0 in | Wt 266.0 lb

## 2024-01-28 DIAGNOSIS — Z6841 Body Mass Index (BMI) 40.0 and over, adult: Secondary | ICD-10-CM

## 2024-01-28 DIAGNOSIS — E66813 Obesity, class 3: Secondary | ICD-10-CM | POA: Diagnosis not present

## 2024-01-28 DIAGNOSIS — R632 Polyphagia: Secondary | ICD-10-CM | POA: Diagnosis not present

## 2024-01-28 MED ORDER — WEGOVY 2.4 MG/0.75ML ~~LOC~~ SOAJ
2.4000 mg | SUBCUTANEOUS | 0 refills | Status: DC
  Filled 2024-01-28: qty 3, 28d supply, fill #0

## 2024-01-28 NOTE — Progress Notes (Signed)
 Office: [phone]  /  Fax: [phone]  WEIGHT SUMMARY AND BIOMETRICS  Weight Lost Since Last Visit: 3lb  Weight Gained Since Last Visit: 0lb   Vitals Temp: 97.9 F (36.6 C) BP: 139/84 Pulse Rate: 60 SpO2: 99 %   Anthropometric Measurements Height: 5\' 9"  (1.753 m) Weight: 266 lb (120.7 kg) BMI (Calculated): 39.26 Weight at Last Visit: 269lb Weight Lost Since Last Visit: 3lb Weight Gained Since Last Visit: 0lb Starting Weight: 284lb Total Weight Loss (lbs): 18 lb (8.165 kg)   Body Composition  Body Fat %: 42.3 % Fat Mass (lbs): 112.8 lbs Muscle Mass (lbs): 146.2 lbs Total Body Water (lbs): 98.2 lbs Visceral Fat Rating : 11   Other Clinical Data Fasting: No Labs: No Today's Visit #: 29 Starting Date: 11/20/21     HPI  Chief Complaint: OBESITY  Leslie Branch is here to discuss her progress with her obesity treatment plan. She is on the keeping a food journal and adhering to recommended goals of 1900 calories and 60-70 protein and states she is following her eating plan approximately 75 % of the time. She states she is exercising 45+ minutes 3 days per week.   Interval History:  Since last office visit she has lost 3 pounds.  She has increased her protein intake.  She is drinking water, a protein shake and seltzer water.  She is running 3 days per week. She is still working on her garage-goal it to put a gym in it.  She is signed up for several trail/road races.    Pharmacotherapy for weight loss: She is currently taking Wegovy  2.4 mg for medical weight loss.  Denies side effects.  Has helped with polyphagia and cravings.     Wegovy  has been approved from 12/31/23-12/30/24.    Previous pharmacotherapy for medical weight loss:  Ozempic -stopped due to cost     Bariatric surgery:  Patient has not had bariatric surgery.        PHYSICAL EXAM:  Blood pressure 139/84, pulse 60, temperature 97.9 F (36.6 C), height 5\' 9"  (1.753 m), weight 266 lb (120.7 kg), SpO2  99%. Body mass index is 39.28 kg/m.  General: She is overweight, cooperative, alert, well developed, and in no acute distress. PSYCH: Has normal mood, affect and thought process.   Extremities: No edema.  Neurologic: No gross sensory or motor deficits. No tremors or fasciculations noted.    DIAGNOSTIC DATA REVIEWED:  BMET    Component Value Date/Time   NA 138 11/23/2023 1059   NA 140 08/21/2016 1328   K 4.3 11/23/2023 1059   K 4.0 08/21/2016 1328   CL 104 11/23/2023 1059   CO2 21 11/23/2023 1059   CO2 23 08/21/2016 1328   GLUCOSE 85 11/23/2023 1059   GLUCOSE 113 (H) 07/21/2019 0947   GLUCOSE 92 08/21/2016 1328   BUN 9 11/23/2023 1059   BUN 11.7 08/21/2016 1328   CREATININE 0.68 11/23/2023 1059   CREATININE 0.65 11/01/2018 0830   CREATININE 0.7 08/21/2016 1328   CALCIUM  8.8 11/23/2023 1059   CALCIUM  8.7 08/21/2016 1328   GFRNONAA >60 07/21/2019 0947   GFRNONAA >60 11/01/2018 0830   GFRAA >60 07/21/2019 0947   GFRAA >60 11/01/2018 0830   Lab Results  Component Value Date   HGBA1C 5.1 11/23/2023   HGBA1C 5.8 (H) 04/17/2021   Lab Results  Component Value Date   INSULIN  24.5 11/23/2023   INSULIN  17.3 11/20/2021   Lab Results  Component Value Date   TSH 1.850 11/23/2023  CBC    Component Value Date/Time   WBC 9.2 11/23/2023 1059   WBC 9.2 07/21/2019 0947   RBC 4.46 11/23/2023 1059   RBC 4.37 07/21/2019 0947   HGB 13.3 11/23/2023 1059   HGB 7.0 (L) 08/21/2016 1328   HCT 38.3 11/23/2023 1059   HCT 22.5 (L) 08/21/2016 1328   PLT 290 11/23/2023 1059   MCV 86 11/23/2023 1059   MCV 74.8 (L) 08/21/2016 1328   MCH 29.8 11/23/2023 1059   MCH 24.3 (L) 07/21/2019 0947   MCHC 34.7 11/23/2023 1059   MCHC 30.5 07/21/2019 0947   RDW 12.1 11/23/2023 1059   RDW 14.2 08/21/2016 1328   Iron  Studies    Component Value Date/Time   IRON  61 03/24/2022 1021   IRON  14 (L) 08/21/2016 1328   TIBC 428 08/09/2018 0943   TIBC 459 (H) 08/21/2016 1328   FERRITIN 30 08/09/2018  0943   FERRITIN 10 08/21/2016 1328   IRONPCTSAT 17 (L) 08/09/2018 0943   IRONPCTSAT 3 (L) 08/21/2016 1328   Lipid Panel     Component Value Date/Time   CHOL 154 11/23/2023 1059   TRIG 87 11/23/2023 1059   HDL 40 11/23/2023 1059   LDLCALC 97 11/23/2023 1059   Hepatic Function Panel     Component Value Date/Time   PROT 6.6 11/23/2023 1059   PROT 6.7 08/21/2016 1328   ALBUMIN 4.4 11/23/2023 1059   ALBUMIN 3.6 08/21/2016 1328   AST 22 11/23/2023 1059   AST 10 (L) 11/01/2018 0830   AST 60 (H) 08/21/2016 1328   ALT 17 11/23/2023 1059   ALT 11 11/01/2018 0830   ALT 87 (H) 08/21/2016 1328   ALKPHOS 63 11/23/2023 1059   ALKPHOS 70 08/21/2016 1328   BILITOT 0.6 11/23/2023 1059   BILITOT 0.2 (L) 11/01/2018 0830   BILITOT 0.54 08/21/2016 1328   BILIDIR <0.1 07/21/2019 0947   IBILI NOT CALCULATED 07/21/2019 0947      Component Value Date/Time   TSH 1.850 11/23/2023 1059   Nutritional Lab Results  Component Value Date   VD25OH 34.7 11/23/2023   VD25OH 29.7 (L) 02/12/2023   VD25OH 27.3 (L) 07/30/2022     ASSESSMENT AND PLAN  TREATMENT PLAN FOR OBESITY:  Recommended Dietary Goals  Leslie Branch is currently in the action stage of change. As such, her goal is to continue weight management plan. She has agreed to keeping a food journal and adhering to recommended goals of 1900-2000 calories and 100+ grams protein.  Behavioral Intervention  We discussed the following Behavioral Modification Strategies today: increasing lean protein intake to established goals, decreasing simple carbohydrates , increasing vegetables, increasing fiber rich foods, increasing water intake , continue to practice mindfulness when eating, planning for success, and continue to work on maintaining a reduced calorie state, getting the recommended amount of protein, incorporating whole foods, making healthy choices, staying well hydrated and practicing mindfulness when eating..  Additional resources provided  today: NA  Recommended Physical Activity Goals  Leslie Branch has been advised to work up to 150 minutes of moderate intensity aerobic activity a week and strengthening exercises 2-3 times per week for cardiovascular health, weight loss maintenance and preservation of muscle mass.   She has agreed to Continue current level of physical activity  and Start strengthening exercises with a goal of 2-3 sessions a week    Pharmacotherapy We discussed various medication options to help Leslie Branch with her weight loss efforts and we both agreed to continue Wegovy  2.4mg .  side effects discussed.  ASSOCIATED CONDITIONS ADDRESSED TODAY  Action/Plan  Polyphagia -     Wegovy ; Inject 2.4 mg into the skin once a week.  Dispense: 3 mL; Refill: 0  Class 3 severe obesity due to excess calories without serious comorbidity with body mass index (BMI) of 40.0 to 44.9 in adult -     Wegovy ; Inject 2.4 mg into the skin once a week.  Dispense: 3 mL; Refill: 0      Last labs 11/23/23   Return in about 4 weeks (around 02/25/2024).Aaron Aas She was informed of the importance of frequent follow up visits to maximize her success with intensive lifestyle modifications for her multiple health conditions.   ATTESTASTION STATEMENTS:  Reviewed by clinician on day of visit: allergies, medications, problem list, medical history, surgical history, family history, social history, and previous encounter notes.     Crist Dominion. Ernestene Coover FNP-C

## 2024-02-22 ENCOUNTER — Other Ambulatory Visit (HOSPITAL_COMMUNITY): Payer: Self-pay

## 2024-02-22 ENCOUNTER — Ambulatory Visit (INDEPENDENT_AMBULATORY_CARE_PROVIDER_SITE_OTHER): Admitting: Nurse Practitioner

## 2024-02-22 ENCOUNTER — Encounter: Payer: Self-pay | Admitting: Nurse Practitioner

## 2024-02-22 VITALS — BP 152/85 | HR 57 | Temp 97.7°F | Ht 69.0 in | Wt 266.0 lb

## 2024-02-22 DIAGNOSIS — I10 Essential (primary) hypertension: Secondary | ICD-10-CM

## 2024-02-22 DIAGNOSIS — R632 Polyphagia: Secondary | ICD-10-CM

## 2024-02-22 DIAGNOSIS — E66812 Obesity, class 2: Secondary | ICD-10-CM | POA: Diagnosis not present

## 2024-02-22 DIAGNOSIS — E559 Vitamin D deficiency, unspecified: Secondary | ICD-10-CM | POA: Diagnosis not present

## 2024-02-22 DIAGNOSIS — Z6839 Body mass index (BMI) 39.0-39.9, adult: Secondary | ICD-10-CM

## 2024-02-22 MED ORDER — WEGOVY 2.4 MG/0.75ML ~~LOC~~ SOAJ
2.4000 mg | SUBCUTANEOUS | 0 refills | Status: DC
  Filled 2024-02-22: qty 3, 28d supply, fill #0

## 2024-02-22 MED ORDER — LOSARTAN POTASSIUM 50 MG PO TABS
50.0000 mg | ORAL_TABLET | Freq: Every day | ORAL | 0 refills | Status: DC
  Filled 2024-02-22: qty 30, 30d supply, fill #0

## 2024-02-22 MED ORDER — VITAMIN D (ERGOCALCIFEROL) 1.25 MG (50000 UNIT) PO CAPS
50000.0000 [IU] | ORAL_CAPSULE | ORAL | 0 refills | Status: DC
  Filled 2024-02-22: qty 5, 35d supply, fill #0

## 2024-02-22 NOTE — Progress Notes (Signed)
 Office: [phone]  /  Fax: [phone]  WEIGHT SUMMARY AND BIOMETRICS  Weight Lost Since Last Visit: 0  Weight Gained Since Last Visit: 0   Vitals Temp: 97.7 F (36.5 C) BP: (!) 152/85 Pulse Rate: (!) 57 SpO2: 100 %   Anthropometric Measurements Height: 5\' 9"  (1.753 m) Weight: 266 lb (120.7 kg) BMI (Calculated): 39.26 Weight at Last Visit: 266lb Weight Lost Since Last Visit: 0 Weight Gained Since Last Visit: 0 Starting Weight: 284lb Total Weight Loss (lbs): 18 lb (8.165 kg)   Body Composition  Body Fat %: 43.2 % Fat Mass (lbs): 115.2 lbs Muscle Mass (lbs): 144 lbs Total Body Water (lbs): 100 lbs Visceral Fat Rating : 11   Other Clinical Data Fasting: no Labs: no Today's Visit #: 30 Starting Date: 11/20/21     HPI  Chief Complaint: OBESITY  Leslie Branch is here to discuss her progress with her obesity treatment plan. She is on the keeping a food journal and adhering to recommended goals of 1,900 calories and 60-70 protein and states she is following her eating plan approximately 90 % of the time. She states she is exercising 30-240 minutes 3 days per week.   Interval History:  Since last office visit she has maintained her weight.  She hasn't been running as many miles over the past 3 weeks but plans to pick back up on her mileage for upcoming races.  She notes "I can see the differences in my body".   She has been eating based upon hunger.  She is eating 3 meals per day and is eating a protein with each meal. Not tracking.   She is drinking a protein shake, energy drink and water daily.    Pharmacotherapy for weight loss: She is currently taking Wegovy  2.4 mg for medical weight loss.  Denies side effects.  Has continued to help with polyphagia and cravings.     Wegovy  has been approved from 12/31/23-12/30/24.    Previous pharmacotherapy for medical weight loss:  Ozempic -stopped due to cost     Bariatric surgery:  Patient has not had bariatric surgery.        Hypertension Hypertension BP is elevated today.  Medication(s): Cozaar  50mg . Denies side effects.  Saw cardiology last 05/07/23.  Had a follow up appt in Aug but appt was cancelled by the provider.  Denies chest pain, palpitations and SOB.  BP Readings from Last 3 Encounters:  02/22/24 (!) 152/85  01/28/24 139/84  12/30/23 132/89   Lab Results  Component Value Date   CREATININE 0.68 11/23/2023   CREATININE 0.90 02/12/2023   CREATININE 0.72 07/30/2022      PHYSICAL EXAM:  Blood pressure (!) 152/85, pulse (!) 57, temperature 97.7 F (36.5 C), height 5\' 9"  (1.753 m), weight 266 lb (120.7 kg), SpO2 100%. Body mass index is 39.28 kg/m.  General: She is overweight, cooperative, alert, well developed, and in no acute distress. PSYCH: Has normal mood, affect and thought process.   Extremities: No edema.  Neurologic: No gross sensory or motor deficits. No tremors or fasciculations noted.    DIAGNOSTIC DATA REVIEWED:  BMET    Component Value Date/Time   NA 138 11/23/2023 1059   NA 140 08/21/2016 1328   K 4.3 11/23/2023 1059   K 4.0 08/21/2016 1328   CL 104 11/23/2023 1059   CO2 21 11/23/2023 1059   CO2 23 08/21/2016 1328   GLUCOSE 85 11/23/2023 1059   GLUCOSE 113 (H) 07/21/2019 0947   GLUCOSE 92 08/21/2016  1328   BUN 9 11/23/2023 1059   BUN 11.7 08/21/2016 1328   CREATININE 0.68 11/23/2023 1059   CREATININE 0.65 11/01/2018 0830   CREATININE 0.7 08/21/2016 1328   CALCIUM  8.8 11/23/2023 1059   CALCIUM  8.7 08/21/2016 1328   GFRNONAA >60 07/21/2019 0947   GFRNONAA >60 11/01/2018 0830   GFRAA >60 07/21/2019 0947   GFRAA >60 11/01/2018 0830   Lab Results  Component Value Date   HGBA1C 5.1 11/23/2023   HGBA1C 5.8 (H) 04/17/2021   Lab Results  Component Value Date   INSULIN  24.5 11/23/2023   INSULIN  17.3 11/20/2021   Lab Results  Component Value Date   TSH 1.850 11/23/2023   CBC    Component Value Date/Time   WBC 9.2 11/23/2023 1059   WBC 9.2 07/21/2019 0947    RBC 4.46 11/23/2023 1059   RBC 4.37 07/21/2019 0947   HGB 13.3 11/23/2023 1059   HGB 7.0 (L) 08/21/2016 1328   HCT 38.3 11/23/2023 1059   HCT 22.5 (L) 08/21/2016 1328   PLT 290 11/23/2023 1059   MCV 86 11/23/2023 1059   MCV 74.8 (L) 08/21/2016 1328   MCH 29.8 11/23/2023 1059   MCH 24.3 (L) 07/21/2019 0947   MCHC 34.7 11/23/2023 1059   MCHC 30.5 07/21/2019 0947   RDW 12.1 11/23/2023 1059   RDW 14.2 08/21/2016 1328   Iron  Studies    Component Value Date/Time   IRON  61 03/24/2022 1021   IRON  14 (L) 08/21/2016 1328   TIBC 428 08/09/2018 0943   TIBC 459 (H) 08/21/2016 1328   FERRITIN 30 08/09/2018 0943   FERRITIN 10 08/21/2016 1328   IRONPCTSAT 17 (L) 08/09/2018 0943   IRONPCTSAT 3 (L) 08/21/2016 1328   Lipid Panel     Component Value Date/Time   CHOL 154 11/23/2023 1059   TRIG 87 11/23/2023 1059   HDL 40 11/23/2023 1059   LDLCALC 97 11/23/2023 1059   Hepatic Function Panel     Component Value Date/Time   PROT 6.6 11/23/2023 1059   PROT 6.7 08/21/2016 1328   ALBUMIN 4.4 11/23/2023 1059   ALBUMIN 3.6 08/21/2016 1328   AST 22 11/23/2023 1059   AST 10 (L) 11/01/2018 0830   AST 60 (H) 08/21/2016 1328   ALT 17 11/23/2023 1059   ALT 11 11/01/2018 0830   ALT 87 (H) 08/21/2016 1328   ALKPHOS 63 11/23/2023 1059   ALKPHOS 70 08/21/2016 1328   BILITOT 0.6 11/23/2023 1059   BILITOT 0.2 (L) 11/01/2018 0830   BILITOT 0.54 08/21/2016 1328   BILIDIR <0.1 07/21/2019 0947   IBILI NOT CALCULATED 07/21/2019 0947      Component Value Date/Time   TSH 1.850 11/23/2023 1059   Nutritional Lab Results  Component Value Date   VD25OH 34.7 11/23/2023   VD25OH 29.7 (L) 02/12/2023   VD25OH 27.3 (L) 07/30/2022     ASSESSMENT AND PLAN  TREATMENT PLAN FOR OBESITY:  Recommended Dietary Goals  Leslie Branch is currently in the action stage of change. As such, her goal is to continue weight management plan. She has agreed to track to make sure she is meeting calories and protein goals.   May need to increase calories intake based upon her mileage.  Behavioral Intervention  We discussed the following Behavioral Modification Strategies today: increasing lean protein intake to established goals, decreasing simple carbohydrates , increasing vegetables, increasing fiber rich foods, increasing water intake , work on meal planning and preparation, work on Counselling psychologist calories using tracking application,  reading food labels , keeping healthy foods at home, and continue to work on maintaining a reduced calorie state, getting the recommended amount of protein, incorporating whole foods, making healthy choices, staying well hydrated and practicing mindfulness when eating..  Additional resources provided today: NA  Recommended Physical Activity Goals  Leslie Branch has been advised to work up to 150 minutes of moderate intensity aerobic activity a week and strengthening exercises 2-3 times per week for cardiovascular health, weight loss maintenance and preservation of muscle mass.   She has agreed to Continue current level of physical activity  and Start strengthening exercises with a goal of 2-3 sessions a week    Pharmacotherapy We discussed various medication options to help Leslie Branch with her weight loss efforts and we both agreed to continue Wegovy  2.4mg .  Side effects discussed.  ASSOCIATED CONDITIONS ADDRESSED TODAY  Action/Plan  Vitamin D  deficiency -     Vitamin D  (Ergocalciferol ); Take 1 capsule (50,000 Units total) by mouth every 7 (seven) days.  Dispense: 5 capsule; Refill: 0  Essential hypertension -     Losartan  Potassium; Take 1 tablet (50 mg total) by mouth daily.  Dispense: 30 tablet; Refill: 0  Polyphagia -     Wegovy ; Inject 2.4 mg into the skin once a week.  Dispense: 3 mL; Refill: 0  Class 2 obesity due to excess calories without serious comorbidity with body mass index (BMI) of 39.0 to 39.9 in adult -     Wegovy ; Inject 2.4 mg into the skin once a week.   Dispense: 3 mL; Refill: 0         Return in about 4 weeks (around 03/21/2024).Aaron Aas She was informed of the importance of frequent follow up visits to maximize her success with intensive lifestyle modifications for her multiple health conditions.   ATTESTASTION STATEMENTS:  Reviewed by clinician on day of visit: allergies, medications, problem list, medical history, surgical history, family history, social history, and previous encounter notes.     Crist Dominion. Leslie Belles FNP-C

## 2024-03-19 ENCOUNTER — Encounter: Payer: Self-pay | Admitting: Hematology

## 2024-03-23 ENCOUNTER — Other Ambulatory Visit (HOSPITAL_COMMUNITY): Payer: Self-pay

## 2024-03-23 ENCOUNTER — Telehealth: Payer: Self-pay

## 2024-03-23 ENCOUNTER — Ambulatory Visit (INDEPENDENT_AMBULATORY_CARE_PROVIDER_SITE_OTHER): Admitting: Nurse Practitioner

## 2024-03-23 ENCOUNTER — Encounter: Payer: Self-pay | Admitting: Nurse Practitioner

## 2024-03-23 VITALS — BP 132/87 | HR 67 | Temp 98.2°F | Ht 69.0 in | Wt 264.0 lb

## 2024-03-23 DIAGNOSIS — E66812 Obesity, class 2: Secondary | ICD-10-CM

## 2024-03-23 DIAGNOSIS — Z6838 Body mass index (BMI) 38.0-38.9, adult: Secondary | ICD-10-CM

## 2024-03-23 DIAGNOSIS — I10 Essential (primary) hypertension: Secondary | ICD-10-CM | POA: Diagnosis not present

## 2024-03-23 DIAGNOSIS — E65 Localized adiposity: Secondary | ICD-10-CM | POA: Diagnosis not present

## 2024-03-23 MED ORDER — ZEPBOUND 5 MG/0.5ML ~~LOC~~ SOAJ
5.0000 mg | SUBCUTANEOUS | 0 refills | Status: DC
  Filled 2024-03-23 – 2024-03-24 (×3): qty 2, 28d supply, fill #0

## 2024-03-23 MED ORDER — LOSARTAN POTASSIUM 50 MG PO TABS
50.0000 mg | ORAL_TABLET | Freq: Every day | ORAL | 0 refills | Status: DC
  Filled 2024-03-23: qty 90, 90d supply, fill #0

## 2024-03-23 NOTE — Telephone Encounter (Signed)
 PA submitted for Zepbound  through [employer]. Awaiting insurance determination. Key: [id number]

## 2024-03-23 NOTE — Progress Notes (Signed)
 Office: [phone]  /  Fax: [phone]  WEIGHT SUMMARY AND BIOMETRICS  Weight Lost Since Last Visit: 2lb  Weight Gained Since Last Visit: 0lb   Vitals Temp: 98.2 F (36.8 C) BP: 132/87 Pulse Rate: 67 SpO2: 100 %   Anthropometric Measurements Height: 5' 9 (1.753 m) Weight: 264 lb (119.7 kg) BMI (Calculated): 38.97 Weight at Last Visit: 266lb Weight Lost Since Last Visit: 2lb Weight Gained Since Last Visit: 0lb Starting Weight: 284lb Total Weight Loss (lbs): 20 lb (9.072 kg)   Body Composition  Body Fat %: 35.5 % Fat Mass (lbs): 93.8 lbs Muscle Mass (lbs): 161.6 lbs Total Body Water (lbs): 100.2 lbs Visceral Fat Rating : 9   Other Clinical Data Fasting: No Labs: No Today's Visit #: 31 Starting Date: 11/20/21     HPI  Chief Complaint: OBESITY  Loxley is here to discuss her progress with her obesity treatment plan. She is on the keeping a food journal and adhering to recommended goals of 1900 calories and 60-70 protein and states she is following her eating plan approximately 80 % of the time. She states she is exercising 30 minutes 3 days per week.   Interval History:  Since last office visit she has lost 2 pounds.  She has lost a total of 20 lbs since her initial visit.  She is running 3 days per week.  She is snacking on protein bars and yogurt.  She is drinking water daily and occ juice.   Highest weight was 330 lbs.   Pharmacotherapy for weight loss: She is currently taking Wegovy  2.4 mg for medical weight loss.  Denies side effects.  Has continued to help with polyphagia and cravings.     Wegovy  has been approved from 12/31/23-12/30/24.    Previous pharmacotherapy for medical weight loss:  Ozempic -stopped due to cost     Bariatric surgery:  Patient has not had bariatric surgery.    Hypertension Hypertension stable.  Medication(s): Cozaar  50mg . Denies side effects.  Denies chest pain, palpitations and SOB.  BP Readings from Last 3  Encounters:  03/23/24 132/87  02/22/24 (!) 152/85  01/28/24 139/84   Lab Results  Component Value Date   CREATININE 0.68 11/23/2023   CREATININE 0.90 02/12/2023   CREATININE 0.72 07/30/2022    Abdominal pannus Has been struggling with for years-getting worse.  Notes redness, irritation, yeast and some bleeding. Uses OTC medswith little relief.    PHYSICAL EXAM:  Blood pressure 132/87, pulse 67, temperature 98.2 F (36.8 C), height 5' 9 (1.753 m), weight 264 lb (119.7 kg), SpO2 100%. Body mass index is 38.99 kg/m.  General: She is overweight, cooperative, alert, well developed, and in no acute distress. PSYCH: Has normal mood, affect and thought process.   Extremities: No edema.  Neurologic: No gross sensory or motor deficits. No tremors or fasciculations noted.    DIAGNOSTIC DATA REVIEWED:  BMET    Component Value Date/Time   NA 138 11/23/2023 1059   NA 140 08/21/2016 1328   K 4.3 11/23/2023 1059   K 4.0 08/21/2016 1328   CL 104 11/23/2023 1059   CO2 21 11/23/2023 1059   CO2 23 08/21/2016 1328   GLUCOSE 85 11/23/2023 1059   GLUCOSE 113 (H) 07/21/2019 0947   GLUCOSE 92 08/21/2016 1328   BUN 9 11/23/2023 1059   BUN 11.7 08/21/2016 1328   CREATININE 0.68 11/23/2023 1059   CREATININE 0.65 11/01/2018 0830   CREATININE 0.7 08/21/2016 1328   CALCIUM  8.8 11/23/2023 1059  CALCIUM  8.7 08/21/2016 1328   GFRNONAA >60 07/21/2019 0947   GFRNONAA >60 11/01/2018 0830   GFRAA >60 07/21/2019 0947   GFRAA >60 11/01/2018 0830   Lab Results  Component Value Date   HGBA1C 5.1 11/23/2023   HGBA1C 5.8 (H) 04/17/2021   Lab Results  Component Value Date   INSULIN  24.5 11/23/2023   INSULIN  17.3 11/20/2021   Lab Results  Component Value Date   TSH 1.850 11/23/2023   CBC    Component Value Date/Time   WBC 9.2 11/23/2023 1059   WBC 9.2 07/21/2019 0947   RBC 4.46 11/23/2023 1059   RBC 4.37 07/21/2019 0947   HGB 13.3 11/23/2023 1059   HGB 7.0 (L) 08/21/2016 1328   HCT  38.3 11/23/2023 1059   HCT 22.5 (L) 08/21/2016 1328   PLT 290 11/23/2023 1059   MCV 86 11/23/2023 1059   MCV 74.8 (L) 08/21/2016 1328   MCH 29.8 11/23/2023 1059   MCH 24.3 (L) 07/21/2019 0947   MCHC 34.7 11/23/2023 1059   MCHC 30.5 07/21/2019 0947   RDW 12.1 11/23/2023 1059   RDW 14.2 08/21/2016 1328   Iron  Studies    Component Value Date/Time   IRON  61 03/24/2022 1021   IRON  14 (L) 08/21/2016 1328   TIBC 428 08/09/2018 0943   TIBC 459 (H) 08/21/2016 1328   FERRITIN 30 08/09/2018 0943   FERRITIN 10 08/21/2016 1328   IRONPCTSAT 17 (L) 08/09/2018 0943   IRONPCTSAT 3 (L) 08/21/2016 1328   Lipid Panel     Component Value Date/Time   CHOL 154 11/23/2023 1059   TRIG 87 11/23/2023 1059   HDL 40 11/23/2023 1059   LDLCALC 97 11/23/2023 1059   Hepatic Function Panel     Component Value Date/Time   PROT 6.6 11/23/2023 1059   PROT 6.7 08/21/2016 1328   ALBUMIN 4.4 11/23/2023 1059   ALBUMIN 3.6 08/21/2016 1328   AST 22 11/23/2023 1059   AST 10 (L) 11/01/2018 0830   AST 60 (H) 08/21/2016 1328   ALT 17 11/23/2023 1059   ALT 11 11/01/2018 0830   ALT 87 (H) 08/21/2016 1328   ALKPHOS 63 11/23/2023 1059   ALKPHOS 70 08/21/2016 1328   BILITOT 0.6 11/23/2023 1059   BILITOT 0.2 (L) 11/01/2018 0830   BILITOT 0.54 08/21/2016 1328   BILIDIR <0.1 07/21/2019 0947   IBILI NOT CALCULATED 07/21/2019 0947      Component Value Date/Time   TSH 1.850 11/23/2023 1059   Nutritional Lab Results  Component Value Date   VD25OH 34.7 11/23/2023   VD25OH 29.7 (L) 02/12/2023   VD25OH 27.3 (L) 07/30/2022     ASSESSMENT AND PLAN  TREATMENT PLAN FOR OBESITY:  Recommended Dietary Goals  Nil is currently in the action stage of change. As such, her goal is to continue weight management plan. She has agreed to keeping a food journal and adhering to recommended goals of 1900-2000 calories and 100+ protein.  Behavioral Intervention  We discussed the following Behavioral Modification  Strategies today: increasing lean protein intake to established goals, decreasing simple carbohydrates , increasing vegetables, increasing fiber rich foods, increasing water intake , reading food labels , keeping healthy foods at home, and continue to work on maintaining a reduced calorie state, getting the recommended amount of protein, incorporating whole foods, making healthy choices, staying well hydrated and practicing mindfulness when eating..  Additional resources provided today: NA  Recommended Physical Activity Goals  Jacolyn has been advised to work up to 150 minutes  of moderate intensity aerobic activity a week and strengthening exercises 2-3 times per week for cardiovascular health, weight loss maintenance and preservation of muscle mass.   She has agreed to Continue current level of physical activity    Pharmacotherapy We discussed various medication options to help Amie with her weight loss efforts and we both agreed to stop Wegovy  and start Zepbound  5mg  due to increase in polyphagia and cravings.  Side effects discussed.  ASSOCIATED CONDITIONS ADDRESSED TODAY  Action/Plan  Abdominal pannus Continue OTC meds, will continue to monitor.  Follow up with PCP  Essential hypertension -     continue Losartan  Potassium; Take 1 tablet (50 mg total) by mouth daily.  Dispense: 90 tablet; Refill: 0  Obesity, Class II, BMI 35-39.9 -     Zepbound ; Inject 5 mg into the skin once a week.  Dispense: 2 mL; Refill: 0      Goals Increase protein intake Increase water intake   Return in about 4 weeks (around 04/20/2024).SABRA She was informed of the importance of frequent follow up visits to maximize her success with intensive lifestyle modifications for her multiple health conditions.   ATTESTASTION STATEMENTS:  Reviewed by clinician on day of visit: allergies, medications, problem list, medical history, surgical history, family history, social history, and previous encounter  notes.    Corean SAUNDERS. Arcenio Mullaly FNP-C

## 2024-03-24 ENCOUNTER — Other Ambulatory Visit: Payer: Self-pay | Admitting: Nurse Practitioner

## 2024-03-24 ENCOUNTER — Other Ambulatory Visit (HOSPITAL_COMMUNITY): Payer: Self-pay

## 2024-03-24 DIAGNOSIS — E66812 Obesity, class 2: Secondary | ICD-10-CM

## 2024-03-24 MED ORDER — WEGOVY 2.4 MG/0.75ML ~~LOC~~ SOAJ
2.4000 mg | SUBCUTANEOUS | 0 refills | Status: DC
  Filled 2024-03-24: qty 3, 28d supply, fill #0

## 2024-03-24 NOTE — Telephone Encounter (Signed)
 Received fax from [employer] that Zepbound  is not covered.  This member's plan benefit does not provide benefit coverage for this class of medications

## 2024-03-24 NOTE — Telephone Encounter (Signed)
 PA submitted through [facility] via fax for Zepbound . Awaiting insurance determination.

## 2024-03-28 NOTE — Telephone Encounter (Signed)
 Received fax from [facility] that Zepbound  has been approved from 03/24/24-09/24/24.

## 2024-04-20 ENCOUNTER — Encounter: Payer: Self-pay | Admitting: Nurse Practitioner

## 2024-04-25 ENCOUNTER — Other Ambulatory Visit (HOSPITAL_COMMUNITY): Payer: Self-pay

## 2024-04-25 ENCOUNTER — Other Ambulatory Visit: Payer: Self-pay | Admitting: Nurse Practitioner

## 2024-04-25 DIAGNOSIS — E66812 Obesity, class 2: Secondary | ICD-10-CM

## 2024-04-25 MED ORDER — ZEPBOUND 5 MG/0.5ML ~~LOC~~ SOAJ
5.0000 mg | SUBCUTANEOUS | 0 refills | Status: DC
Start: 2024-04-25 — End: 2024-05-30
  Filled 2024-04-25: qty 2, 28d supply, fill #0

## 2024-05-05 ENCOUNTER — Encounter: Payer: Self-pay | Admitting: Nurse Practitioner

## 2024-05-05 ENCOUNTER — Ambulatory Visit: Payer: Self-pay | Admitting: Cardiology

## 2024-05-05 ENCOUNTER — Other Ambulatory Visit (HOSPITAL_COMMUNITY): Payer: Self-pay

## 2024-05-05 ENCOUNTER — Ambulatory Visit (INDEPENDENT_AMBULATORY_CARE_PROVIDER_SITE_OTHER): Admitting: Nurse Practitioner

## 2024-05-05 VITALS — BP 147/80 | HR 62 | Temp 98.2°F | Ht 69.0 in | Wt 263.0 lb

## 2024-05-05 DIAGNOSIS — Z6838 Body mass index (BMI) 38.0-38.9, adult: Secondary | ICD-10-CM | POA: Diagnosis not present

## 2024-05-05 DIAGNOSIS — E559 Vitamin D deficiency, unspecified: Secondary | ICD-10-CM | POA: Diagnosis not present

## 2024-05-05 DIAGNOSIS — E66812 Obesity, class 2: Secondary | ICD-10-CM

## 2024-05-05 MED ORDER — ZEPBOUND 7.5 MG/0.5ML ~~LOC~~ SOAJ
7.5000 mg | SUBCUTANEOUS | 0 refills | Status: DC
  Filled 2024-05-05 – 2024-05-19 (×3): qty 2, 28d supply, fill #0

## 2024-05-05 MED ORDER — VITAMIN D (ERGOCALCIFEROL) 1.25 MG (50000 UNIT) PO CAPS
50000.0000 [IU] | ORAL_CAPSULE | ORAL | 0 refills | Status: DC
  Filled 2024-05-05: qty 5, 35d supply, fill #0

## 2024-05-05 NOTE — Progress Notes (Signed)
 Office: [phone]  /  Fax: [phone]  WEIGHT SUMMARY AND BIOMETRICS  Weight Lost Since Last Visit: 1lb  Weight Gained Since Last Visit: 0lb   Vitals Temp: 98.2 F (36.8 C) BP: (!) 147/80 Pulse Rate: 62 SpO2: 99 %   Anthropometric Measurements Height: 5' 9 (1.753 m) Weight: 263 lb (119.3 kg) BMI (Calculated): 38.82 Weight at Last Visit: 264lb Weight Lost Since Last Visit: 1lb Weight Gained Since Last Visit: 0lb Starting Weight: 284lb Total Weight Loss (lbs): 21 lb (9.526 kg)   Body Composition  Body Fat %: 41.9 % Fat Mass (lbs): 110.2 lbs Muscle Mass (lbs): 145.4 lbs Total Body Water (lbs): 98.2 lbs Visceral Fat Rating : 11   Other Clinical Data Fasting: Yes Labs: No Today's Visit #: 32 Starting Date: 11/20/21     HPI  Chief Complaint: OBESITY  Majorie is here to discuss her progress with her obesity treatment plan. She is on the keeping a food journal and adhering to recommended goals of 1900 calories and 60-70 protein and states she is following her eating plan approximately 75-80 % of the time. She states she is exercising 60 minutes 3-5 days per week.   Interval History:  Since last office visit she has lost 1 pound.  She has increased her vegetables and protein.  Has been struggling with constipation.  Has been eating more pita chips.  Has reduced candy intake. She is drinking water, energy drinks and electrolytes daily.  She is [place] the [place] marathon 06[place]05[place]24.  She is running 3 days per week and is walking daily.  She is averaging 7,400 four days and 9,400 three days per week.    Highest weight was 330 lbs.    Pharmacotherapy for weight loss: She is currently taking Zepbound  5 mg for medical weight loss.  Denies side effects.    Wegovy  has been approved from 12/31/23-12/30/24.    Previous pharmacotherapy for medical weight loss:  Ozempic -stopped due to cost     Bariatric surgery:  Patient has not had bariatric surgery.   Vit D  deficiency  She is taking Vit D 50,000 IU weekly.  Denies side effects.  Denies nausea, vomiting or muscle weakness.    Lab Results  Component Value Date   VD25OH 34.7 11/23/2023   VD25OH 29.7 (L) 02/12/2023   VD25OH 27.3 (L) 07/30/2022      PHYSICAL EXAM:  Blood pressure (!) 147/80, pulse 62, temperature 98.2 F (36.8 C), height 5' 9 (1.753 m), weight 263 lb (119.3 kg), SpO2 99%. Body mass index is 38.84 kg/m.  General: She is overweight, cooperative, alert, well developed, and in no acute distress. PSYCH: Has normal mood, affect and thought process.   Extremities: No edema.  Neurologic: No gross sensory or motor deficits. No tremors or fasciculations noted.    DIAGNOSTIC DATA REVIEWED:  BMET    Component Value Date/Time   NA 138 11/23/2023 1059   NA 140 08/21/2016 1328   K 4.3 11/23/2023 1059   K 4.0 08/21/2016 1328   CL 104 11/23/2023 1059   CO2 21 11/23/2023 1059   CO2 23 08/21/2016 1328   GLUCOSE 85 11/23/2023 1059   GLUCOSE 113 (H) 07/21/2019 0947   GLUCOSE 92 08/21/2016 1328   BUN 9 11/23/2023 1059   BUN 11.7 08/21/2016 1328   CREATININE 0.68 11/23/2023 1059   CREATININE 0.65 11/01/2018 0830   CREATININE 0.7 08/21/2016 1328   CALCIUM  8.8 11/23/2023 1059   CALCIUM  8.7 08/21/2016 1328   GFRNONAA >60  07/21/2019 0947   GFRNONAA >60 11/01/2018 0830   GFRAA >60 07/21/2019 0947   GFRAA >60 11/01/2018 0830   Lab Results  Component Value Date   HGBA1C 5.1 11/23/2023   HGBA1C 5.8 (H) 04/17/2021   Lab Results  Component Value Date   INSULIN  24.5 11/23/2023   INSULIN  17.3 11/20/2021   Lab Results  Component Value Date   TSH 1.850 11/23/2023   CBC    Component Value Date/Time   WBC 9.2 11/23/2023 1059   WBC 9.2 07/21/2019 0947   RBC 4.46 11/23/2023 1059   RBC 4.37 07/21/2019 0947   HGB 13.3 11/23/2023 1059   HGB 7.0 (L) 08/21/2016 1328   HCT 38.3 11/23/2023 1059   HCT 22.5 (L) 08/21/2016 1328   PLT 290 11/23/2023 1059   MCV 86 11/23/2023 1059    MCV 74.8 (L) 08/21/2016 1328   MCH 29.8 11/23/2023 1059   MCH 24.3 (L) 07/21/2019 0947   MCHC 34.7 11/23/2023 1059   MCHC 30.5 07/21/2019 0947   RDW 12.1 11/23/2023 1059   RDW 14.2 08/21/2016 1328   Iron  Studies    Component Value Date/Time   IRON  61 03/24/2022 1021   IRON  14 (L) 08/21/2016 1328   TIBC 428 08/09/2018 0943   TIBC 459 (H) 08/21/2016 1328   FERRITIN 30 08/09/2018 0943   FERRITIN 10 08/21/2016 1328   IRONPCTSAT 17 (L) 08/09/2018 0943   IRONPCTSAT 3 (L) 08/21/2016 1328   Lipid Panel     Component Value Date/Time   CHOL 154 11/23/2023 1059   TRIG 87 11/23/2023 1059   HDL 40 11/23/2023 1059   LDLCALC 97 11/23/2023 1059   Hepatic Function Panel     Component Value Date/Time   PROT 6.6 11/23/2023 1059   PROT 6.7 08/21/2016 1328   ALBUMIN 4.4 11/23/2023 1059   ALBUMIN 3.6 08/21/2016 1328   AST 22 11/23/2023 1059   AST 10 (L) 11/01/2018 0830   AST 60 (H) 08/21/2016 1328   ALT 17 11/23/2023 1059   ALT 11 11/01/2018 0830   ALT 87 (H) 08/21/2016 1328   ALKPHOS 63 11/23/2023 1059   ALKPHOS 70 08/21/2016 1328   BILITOT 0.6 11/23/2023 1059   BILITOT 0.2 (L) 11/01/2018 0830   BILITOT 0.54 08/21/2016 1328   BILIDIR <0.1 07/21/2019 0947   IBILI NOT CALCULATED 07/21/2019 0947      Component Value Date/Time   TSH 1.850 11/23/2023 1059   Nutritional Lab Results  Component Value Date   VD25OH 34.7 11/23/2023   VD25OH 29.7 (L) 02/12/2023   VD25OH 27.3 (L) 07/30/2022     ASSESSMENT AND PLAN  TREATMENT PLAN FOR OBESITY:  Recommended Dietary Goals  Zayanna is currently in the action stage of change. As such, her goal is to continue weight management plan. She has agreed to keeping a food journal and adhering to recommended goals of 1900-2000 calories and 100 + grams of protein.  Behavioral Intervention  We discussed the following Behavioral Modification Strategies today: increasing lean protein intake to established goals, decreasing simple carbohydrates ,  increasing vegetables, increasing fiber rich foods, increasing water intake , work on meal planning and preparation, reading food labels , keeping healthy foods at home, continue to work on implementation of reduced calorie nutritional plan, continue to practice mindfulness when eating, planning for success, and continue to work on maintaining a reduced calorie state, getting the recommended amount of protein, incorporating whole foods, making healthy choices, staying well hydrated and practicing mindfulness when eating..  Additional  resources provided today: NA  Recommended Physical Activity Goals  Maebelle has been advised to work up to 150 minutes of moderate intensity aerobic activity a week and strengthening exercises 2-3 times per week for cardiovascular health, weight loss maintenance and preservation of muscle mass.   She has agreed to Continue current level of physical activity    Pharmacotherapy We discussed various medication options to help Kiesha with her weight loss efforts and we both agreed to continue Zepbound  5mg  x 2 more doses and then increase to 7.5mg .  Side effects discussed.  ASSOCIATED CONDITIONS ADDRESSED TODAY  Action/Plan  Vitamin D  deficiency -     Vitamin D  (Ergocalciferol ); Take 1 capsule (50,000 Units total) by mouth every 7 (seven) days.  Dispense: 5 capsule; Refill: 0  Obesity, Class II, BMI 35-39.9 -     Zepbound ; Inject 7.5 mg into the skin once a week.  Dispense: 2 mL; Refill: 0         Return in about 4 weeks (around 06/02/2024).SABRA She was informed of the importance of frequent follow up visits to maximize her success with intensive lifestyle modifications for her multiple health conditions.   ATTESTASTION STATEMENTS:  Reviewed by clinician on day of visit: allergies, medications, problem list, medical history, surgical history, family history, social history, and previous encounter notes.   Corean SAUNDERS. Jake Fuhrmann FNP-C

## 2024-05-09 ENCOUNTER — Encounter (HOSPITAL_COMMUNITY): Payer: Self-pay

## 2024-05-09 ENCOUNTER — Other Ambulatory Visit (HOSPITAL_COMMUNITY): Payer: Self-pay

## 2024-05-19 ENCOUNTER — Other Ambulatory Visit (HOSPITAL_COMMUNITY): Payer: Self-pay

## 2024-05-30 ENCOUNTER — Ambulatory Visit (INDEPENDENT_AMBULATORY_CARE_PROVIDER_SITE_OTHER): Admitting: Nurse Practitioner

## 2024-05-30 ENCOUNTER — Encounter: Payer: Self-pay | Admitting: Nurse Practitioner

## 2024-05-30 ENCOUNTER — Other Ambulatory Visit (HOSPITAL_COMMUNITY): Payer: Self-pay

## 2024-05-30 VITALS — BP 150/96 | HR 62 | Temp 98.1°F | Ht 69.0 in | Wt 262.0 lb

## 2024-05-30 DIAGNOSIS — K7581 Nonalcoholic steatohepatitis (NASH): Secondary | ICD-10-CM

## 2024-05-30 DIAGNOSIS — I10 Essential (primary) hypertension: Secondary | ICD-10-CM | POA: Diagnosis not present

## 2024-05-30 DIAGNOSIS — Z6838 Body mass index (BMI) 38.0-38.9, adult: Secondary | ICD-10-CM

## 2024-05-30 DIAGNOSIS — E66812 Obesity, class 2: Secondary | ICD-10-CM | POA: Diagnosis not present

## 2024-05-30 MED ORDER — ZEPBOUND 10 MG/0.5ML ~~LOC~~ SOAJ
10.0000 mg | SUBCUTANEOUS | 0 refills | Status: AC
Start: 2024-05-30 — End: ?
  Filled 2024-05-30: qty 6, 84d supply, fill #0
  Filled 2024-06-09: qty 2, 28d supply, fill #0
  Filled 2024-06-13: qty 6, 84d supply, fill #0

## 2024-05-30 NOTE — Progress Notes (Addendum)
 Office: [phone]  /  Fax: [phone]  WEIGHT SUMMARY AND BIOMETRICS  Weight Lost Since Last Visit: 1lb  Weight Gained Since Last Visit: 0lb   Vitals Temp: 98.1 F (36.7 C) BP: (!) 150/96 (Patient is on OTC decongestant) Pulse Rate: 62 SpO2: 100 %   Anthropometric Measurements Height: 5' 9 (1.753 m) Weight: 262 lb (118.8 kg) BMI (Calculated): 38.67 Weight at Last Visit: 263lb Weight Lost Since Last Visit: 1lb Weight Gained Since Last Visit: 0lb Starting Weight: 284lb Total Weight Loss (lbs): 22 lb (9.979 kg)   Body Composition  Body Fat %: 42.4 % Fat Mass (lbs): 111.4 lbs Muscle Mass (lbs): 143.8 lbs Total Body Water (lbs): 99 lbs Visceral Fat Rating : 11   Other Clinical Data Fasting: No Labs: No Today's Visit #: 33 Starting Date: 11/20/21     HPI  Chief Complaint: OBESITY  Leslie Branch is here to discuss her progress with her obesity treatment plan. She is on the keeping a food journal and adhering to recommended goals of 1900 calories and 60-70 protein and states she is following her eating plan approximately 85 % of the time. She states she is exercising 45 minutes 3 days per week.   Interval History:  Since last office visit she has lost 1 pound. She is leaving for [place] this week to run the [place] marathon on 06/05/24.    Highest weight was 330 lbs.    Pharmacotherapy for weight loss: She is currently taking Zepbound  7.5 mg for medical weight loss.  Denies side effects.      Previous pharmacotherapy for medical weight loss:  Ozempic -stopped due to cost     Bariatric surgery:  Patient has not had bariatric surgery.    MASH Has seen GI in the past.  Patient reports was confirmed years ago in [place] when she was seeing GI Last CMP was 11/23/23    Fibrosis 4 Score = .72 (Low risk)         Interpretation for patients with HCV          <1.45       -  F0-F1 (Low risk)          1.45-3.25 -  Indeterminate           >3.25      -  F3-F4 (High  risk)     Validated for ages 15-65   Hypertension Hypertension BP is elevated today.  She is taking OTC meds for coughing and to help her sleep Medication(s): Cozaar  50mg . Denies side effects Denies chest pain, palpitations and SOB.  BP Readings from Last 3 Encounters:  05/30/24 (!) 150/96  05/05/24 (!) 147/80  03/23/24 132/87   Lab Results  Component Value Date   CREATININE 0.68 11/23/2023   CREATININE 0.90 02/12/2023   CREATININE 0.72 07/30/2022      PHYSICAL EXAM:  Blood pressure (!) 150/96, pulse 62, temperature 98.1 F (36.7 C), height 5' 9 (1.753 m), weight 262 lb (118.8 kg), SpO2 100%. Body mass index is 38.69 kg/m.  General: She is overweight, cooperative, alert, well developed, and in no acute distress. PSYCH: Has normal mood, affect and thought process.   Extremities: No edema.  Neurologic: No gross sensory or motor deficits. No tremors or fasciculations noted.    DIAGNOSTIC DATA REVIEWED:  BMET    Component Value Date/Time   NA 138 11/23/2023 1059   NA 140 08/21/2016 1328   K 4.3 11/23/2023 1059   K 4.0 08/21/2016 1328  CL 104 11/23/2023 1059   CO2 21 11/23/2023 1059   CO2 23 08/21/2016 1328   GLUCOSE 85 11/23/2023 1059   GLUCOSE 113 (H) 07/21/2019 0947   GLUCOSE 92 08/21/2016 1328   BUN 9 11/23/2023 1059   BUN 11.7 08/21/2016 1328   CREATININE 0.68 11/23/2023 1059   CREATININE 0.65 11/01/2018 0830   CREATININE 0.7 08/21/2016 1328   CALCIUM  8.8 11/23/2023 1059   CALCIUM  8.7 08/21/2016 1328   GFRNONAA >60 07/21/2019 0947   GFRNONAA >60 11/01/2018 0830   GFRAA >60 07/21/2019 0947   GFRAA >60 11/01/2018 0830   Lab Results  Component Value Date   HGBA1C 5.1 11/23/2023   HGBA1C 5.8 (H) 04/17/2021   Lab Results  Component Value Date   INSULIN  24.5 11/23/2023   INSULIN  17.3 11/20/2021   Lab Results  Component Value Date   TSH 1.850 11/23/2023   CBC    Component Value Date/Time   WBC 9.2 11/23/2023 1059   WBC 9.2 07/21/2019 0947    RBC 4.46 11/23/2023 1059   RBC 4.37 07/21/2019 0947   HGB 13.3 11/23/2023 1059   HGB 7.0 (L) 08/21/2016 1328   HCT 38.3 11/23/2023 1059   HCT 22.5 (L) 08/21/2016 1328   PLT 290 11/23/2023 1059   MCV 86 11/23/2023 1059   MCV 74.8 (L) 08/21/2016 1328   MCH 29.8 11/23/2023 1059   MCH 24.3 (L) 07/21/2019 0947   MCHC 34.7 11/23/2023 1059   MCHC 30.5 07/21/2019 0947   RDW 12.1 11/23/2023 1059   RDW 14.2 08/21/2016 1328   Iron  Studies    Component Value Date/Time   IRON  61 03/24/2022 1021   IRON  14 (L) 08/21/2016 1328   TIBC 428 08/09/2018 0943   TIBC 459 (H) 08/21/2016 1328   FERRITIN 30 08/09/2018 0943   FERRITIN 10 08/21/2016 1328   IRONPCTSAT 17 (L) 08/09/2018 0943   IRONPCTSAT 3 (L) 08/21/2016 1328   Lipid Panel     Component Value Date/Time   CHOL 154 11/23/2023 1059   TRIG 87 11/23/2023 1059   HDL 40 11/23/2023 1059   LDLCALC 97 11/23/2023 1059   Hepatic Function Panel     Component Value Date/Time   PROT 6.6 11/23/2023 1059   PROT 6.7 08/21/2016 1328   ALBUMIN 4.4 11/23/2023 1059   ALBUMIN 3.6 08/21/2016 1328   AST 22 11/23/2023 1059   AST 10 (L) 11/01/2018 0830   AST 60 (H) 08/21/2016 1328   ALT 17 11/23/2023 1059   ALT 11 11/01/2018 0830   ALT 87 (H) 08/21/2016 1328   ALKPHOS 63 11/23/2023 1059   ALKPHOS 70 08/21/2016 1328   BILITOT 0.6 11/23/2023 1059   BILITOT 0.2 (L) 11/01/2018 0830   BILITOT 0.54 08/21/2016 1328   BILIDIR <0.1 07/21/2019 0947   IBILI NOT CALCULATED 07/21/2019 0947      Component Value Date/Time   TSH 1.850 11/23/2023 1059   Nutritional Lab Results  Component Value Date   VD25OH 34.7 11/23/2023   VD25OH 29.7 (L) 02/12/2023   VD25OH 27.3 (L) 07/30/2022     ASSESSMENT AND PLAN  TREATMENT PLAN FOR OBESITY:  Recommended Dietary Goals  Leslie Branch is currently in the action stage of change. As such, her goal is to continue weight management plan. She has agreed to keeping a food journal and adhering to recommended goals of 1900  calories and 90 grams of protein.  Behavioral Intervention  We discussed the following Behavioral Modification Strategies today: increasing lean protein intake to established goals,  decreasing simple carbohydrates , increasing vegetables, increasing fiber rich foods, increasing water intake , work on meal planning and preparation, reading food labels , keeping healthy foods at home, continue to work on implementation of reduced calorie nutritional plan, continue to practice mindfulness when eating, continue to work on maintaining a reduced calorie state, getting the recommended amount of protein, incorporating whole foods, making healthy choices, staying well hydrated and practicing mindfulness when eating., and increase protein intake, fibrous foods (25 grams per day for women, 30 grams for men) and water to improve satiety and decrease hunger signals. .  Additional resources provided today: NA  Recommended Physical Activity Goals  Leslie Branch has been advised to work up to 150 minutes of moderate intensity aerobic activity a week and strengthening exercises 2-3 times per week for cardiovascular health, weight loss maintenance and preservation of muscle mass.   She has agreed to Continue current level of physical activity , Think about enjoyable ways to increase daily physical activity and overcoming barriers to exercise, Increase physical activity in their day and reduce sedentary time (increase NEAT)., Start strengthening exercises with a goal of 2-3 sessions a week , and Combine aerobic and strengthening exercises for efficiency and improved cardiometabolic health.   Pharmacotherapy We discussed various medication options to help Leslie Branch with her weight loss efforts and we both agreed to increase to Zepbound  10mg .  Will continue Zepbound  7.5mg  x 2 more doses.  ASSOCIATED CONDITIONS ADDRESSED TODAY  Action/Plan  Metabolic dysfunction-associated steatohepatitis (MASH) Continue working on dietary  changes, exercise and weight loss.  Continue Zepbound  increase to 10 mg.  Side effects discussed.  Essential hypertension Patient has been taking over-the-counter medications.  Continue taking Cozaar  50 mg daily.  To let me know if her BP worsens or continues to be elevated. Limit OTC meds.   Obesity, Class II, BMI 35-39.9 -     Zepbound ; Inject 10 mg into the skin once a week.  Dispense: 6 mL; Refill: 0         Return in about 4 weeks (around 06/27/2024).Leslie Branch She was informed of the importance of frequent follow up visits to maximize her success with intensive lifestyle modifications for her multiple health conditions.   ATTESTASTION STATEMENTS:  Reviewed by clinician on day of visit: allergies, medications, problem list, medical history, surgical history, family history, social history, and previous encounter notes.     Leslie Branch Leslie Branch. Leslie Ehrman FNP-C

## 2024-06-10 ENCOUNTER — Other Ambulatory Visit (HOSPITAL_COMMUNITY): Payer: Self-pay

## 2024-06-13 ENCOUNTER — Other Ambulatory Visit (HOSPITAL_COMMUNITY): Payer: Self-pay

## 2024-06-13 ENCOUNTER — Other Ambulatory Visit: Payer: Self-pay

## 2024-06-21 ENCOUNTER — Encounter: Payer: Self-pay | Admitting: Nurse Practitioner

## 2024-06-21 ENCOUNTER — Ambulatory Visit (INDEPENDENT_AMBULATORY_CARE_PROVIDER_SITE_OTHER): Admitting: Nurse Practitioner

## 2024-06-21 VITALS — BP 141/84 | HR 61 | Temp 98.0°F | Ht 69.0 in | Wt 259.0 lb

## 2024-06-21 DIAGNOSIS — K760 Fatty (change of) liver, not elsewhere classified: Secondary | ICD-10-CM

## 2024-06-21 DIAGNOSIS — E66812 Obesity, class 2: Secondary | ICD-10-CM | POA: Diagnosis not present

## 2024-06-21 DIAGNOSIS — Z6838 Body mass index (BMI) 38.0-38.9, adult: Secondary | ICD-10-CM | POA: Diagnosis not present

## 2024-06-21 DIAGNOSIS — I10 Essential (primary) hypertension: Secondary | ICD-10-CM | POA: Diagnosis not present

## 2024-06-21 NOTE — Progress Notes (Signed)
 Office: [phone]  /  Fax: [phone]  WEIGHT SUMMARY AND BIOMETRICS  Weight Lost Since Last Visit: 3lb  Weight Gained Since Last Visit: 0lb   Vitals Temp: 98 F (36.7 C) BP: (!) 141/84 Pulse Rate: 61 SpO2: 99 %   Anthropometric Measurements Height: 5' 9 (1.753 m) Weight: 259 lb (117.5 kg) BMI (Calculated): 38.23 Weight at Last Visit: 262lb Weight Lost Since Last Visit: 3lb Weight Gained Since Last Visit: 0lb Starting Weight: 284lb Total Weight Loss (lbs): 25 lb (11.3 kg)   Body Composition  Body Fat %: 42.2 % Fat Mass (lbs): 109.6 lbs Muscle Mass (lbs): 142.4 lbs Total Body Water (lbs): 99.4 lbs Visceral Fat Rating : 11   Other Clinical Data Fasting: No Labs: No Today's Visit #: 34 Starting Date: 11/20/21     HPI  Chief Complaint: OBESITY  Leslie Branch is here to discuss her progress with her obesity treatment plan. She is on the keeping a food journal and adhering to recommended goals of 1900 calories and 60-70 protein and states she is following her eating plan approximately 80 % of the time. She states she is exercising 45+ minutes 3 days per week.   Interval History:  Since last office visit she has lost 3 pounds.  She recently returned from running the [employer].  She is drinking water and an energy drink daily.  She is running 3 days per week. She is running a half marathon in 2 & 3 weeks.     Highest weight was 330 lbs.    Pharmacotherapy for weight loss: She is currently taking Zepbound  7.5 mg for medical weight loss.  Denies side effects.    Previous pharmacotherapy for medical weight loss:  Ozempic -stopped due to cost     Bariatric surgery:  Patient has not had bariatric surgery.     Hypertension Hypertension poorly controlled.  Medication(s): Cozaar  50mg .  Denies side effects.   Denies chest pain, palpitations and SOB.  BP Readings from Last 3 Encounters:  06/21/24 (!) 141/84  05/30/24 (!) 150/96  05/05/24 (!) 147/80   Lab  Results  Component Value Date   CREATININE 0.68 11/23/2023   CREATININE 0.90 02/12/2023   CREATININE 0.72 07/30/2022    Fatty liver Has seen GI in the past.  Patient reports was confirmed years ago in [place] when she was seeing GI Last CMP was 11/23/23     Fibrosis 4 Score = .72 (Low risk)          Interpretation for patients with HCV          <1.45       -  F0-F1 (Low risk)          1.45-3.25 -  Indeterminate           >3.25      -  F3-F4 (High risk)     Validated for ages 34-65   PHYSICAL EXAM:  Blood pressure (!) 141/84, pulse 61, temperature 98 F (36.7 C), height 5' 9 (1.753 m), weight 259 lb (117.5 kg), SpO2 99%. Body mass index is 38.25 kg/m.  General: She is overweight, cooperative, alert, well developed, and in no acute distress. PSYCH: Has normal mood, affect and thought process.   Extremities: No edema.  Neurologic: No gross sensory or motor deficits. No tremors or fasciculations noted.    DIAGNOSTIC DATA REVIEWED:  BMET    Component Value Date/Time   NA 138 11/23/2023 1059   NA 140 08/21/2016 1328   K 4.3  11/23/2023 1059   K 4.0 08/21/2016 1328   CL 104 11/23/2023 1059   CO2 21 11/23/2023 1059   CO2 23 08/21/2016 1328   GLUCOSE 85 11/23/2023 1059   GLUCOSE 113 (H) 07/21/2019 0947   GLUCOSE 92 08/21/2016 1328   BUN 9 11/23/2023 1059   BUN 11.7 08/21/2016 1328   CREATININE 0.68 11/23/2023 1059   CREATININE 0.65 11/01/2018 0830   CREATININE 0.7 08/21/2016 1328   CALCIUM  8.8 11/23/2023 1059   CALCIUM  8.7 08/21/2016 1328   GFRNONAA >60 07/21/2019 0947   GFRNONAA >60 11/01/2018 0830   GFRAA >60 07/21/2019 0947   GFRAA >60 11/01/2018 0830   Lab Results  Component Value Date   HGBA1C 5.1 11/23/2023   HGBA1C 5.8 (H) 04/17/2021   Lab Results  Component Value Date   INSULIN  24.5 11/23/2023   INSULIN  17.3 11/20/2021   Lab Results  Component Value Date   TSH 1.850 11/23/2023   CBC    Component Value Date/Time   WBC 9.2 11/23/2023 1059   WBC 9.2  07/21/2019 0947   RBC 4.46 11/23/2023 1059   RBC 4.37 07/21/2019 0947   HGB 13.3 11/23/2023 1059   HGB 7.0 (L) 08/21/2016 1328   HCT 38.3 11/23/2023 1059   HCT 22.5 (L) 08/21/2016 1328   PLT 290 11/23/2023 1059   MCV 86 11/23/2023 1059   MCV 74.8 (L) 08/21/2016 1328   MCH 29.8 11/23/2023 1059   MCH 24.3 (L) 07/21/2019 0947   MCHC 34.7 11/23/2023 1059   MCHC 30.5 07/21/2019 0947   RDW 12.1 11/23/2023 1059   RDW 14.2 08/21/2016 1328   Iron  Studies    Component Value Date/Time   IRON  61 03/24/2022 1021   IRON  14 (L) 08/21/2016 1328   TIBC 428 08/09/2018 0943   TIBC 459 (H) 08/21/2016 1328   FERRITIN 30 08/09/2018 0943   FERRITIN 10 08/21/2016 1328   IRONPCTSAT 17 (L) 08/09/2018 0943   IRONPCTSAT 3 (L) 08/21/2016 1328   Lipid Panel     Component Value Date/Time   CHOL 154 11/23/2023 1059   TRIG 87 11/23/2023 1059   HDL 40 11/23/2023 1059   LDLCALC 97 11/23/2023 1059   Hepatic Function Panel     Component Value Date/Time   PROT 6.6 11/23/2023 1059   PROT 6.7 08/21/2016 1328   ALBUMIN 4.4 11/23/2023 1059   ALBUMIN 3.6 08/21/2016 1328   AST 22 11/23/2023 1059   AST 10 (L) 11/01/2018 0830   AST 60 (H) 08/21/2016 1328   ALT 17 11/23/2023 1059   ALT 11 11/01/2018 0830   ALT 87 (H) 08/21/2016 1328   ALKPHOS 63 11/23/2023 1059   ALKPHOS 70 08/21/2016 1328   BILITOT 0.6 11/23/2023 1059   BILITOT 0.2 (L) 11/01/2018 0830   BILITOT 0.54 08/21/2016 1328   BILIDIR <0.1 07/21/2019 0947   IBILI NOT CALCULATED 07/21/2019 0947      Component Value Date/Time   TSH 1.850 11/23/2023 1059   Nutritional Lab Results  Component Value Date   VD25OH 34.7 11/23/2023   VD25OH 29.7 (L) 02/12/2023   VD25OH 27.3 (L) 07/30/2022     ASSESSMENT AND PLAN  TREATMENT PLAN FOR OBESITY:  Recommended Dietary Goals  Leslie Branch is currently in the action stage of change. As such, her goal is to continue weight management plan. She has agreed to keeping a food journal and adhering to  recommended goals of 1700-1900 calories and 100+ grams protein.  Behavioral Intervention  We discussed the following Behavioral Modification  Strategies today: increasing lean protein intake to established goals, decreasing simple carbohydrates , increasing vegetables, increasing fiber rich foods, increasing water intake , work on meal planning and preparation, reading food labels , keeping healthy foods at home, continue to practice mindfulness when eating, planning for success, continue to work on maintaining a reduced calorie state, getting the recommended amount of protein, incorporating whole foods, making healthy choices, staying well hydrated and practicing mindfulness when eating., and increase protein intake, fibrous foods (25 grams per day for women, 30 grams for men) and water to improve satiety and decrease hunger signals. .  Additional resources provided today: NA  Recommended Physical Activity Goals  Milania has been advised to work up to 150 minutes of moderate intensity aerobic activity a week and strengthening exercises 2-3 times per week for cardiovascular health, weight loss maintenance and preservation of muscle mass.   She has agreed to Think about enjoyable ways to increase daily physical activity and overcoming barriers to exercise, Continue to gradually increase the amount and intensity of exercise routine, Increase volume of physical activity to a goal of 240 minutes a week, and Combine aerobic and strengthening exercises for efficiency and improved cardiometabolic health.   Pharmacotherapy We discussed various medication options to help Cordelia with her weight loss efforts and we both agreed to Zepbound  10mg .  Side effects discussed.  ASSOCIATED CONDITIONS ADDRESSED TODAY  Action/Plan  Essential hypertension Needs better BP control.   Continue Cozaar  50mg  Consider adding hydrochlorothiazide  or Norvasc next visit  Fatty liver Will discuss referral to GI at next  visit Would like fibro scan or US  with electrography   Obesity, Class II, BMI 35-39.9 Continue Zepbound  10mg .          Return in about 4 weeks (around 07/19/2024).SABRA She was informed of the importance of frequent follow up visits to maximize her success with intensive lifestyle modifications for her multiple health conditions.   ATTESTASTION STATEMENTS:  Reviewed by clinician on day of visit: allergies, medications, problem list, medical history, surgical history, family history, social history, and previous encounter notes.  I personally spent a total of 30+  minutes in the care of the patient today including preparing to see the patient, getting/reviewing separately obtained history, counseling and educating, and documenting clinical information in the EHR.    Corean SAUNDERS. Rei Medlen FNP-C

## 2024-06-29 ENCOUNTER — Encounter: Payer: Self-pay | Admitting: Nurse Practitioner

## 2024-06-29 ENCOUNTER — Other Ambulatory Visit: Payer: Self-pay | Admitting: Nurse Practitioner

## 2024-06-29 DIAGNOSIS — E559 Vitamin D deficiency, unspecified: Secondary | ICD-10-CM

## 2024-06-29 DIAGNOSIS — I10 Essential (primary) hypertension: Secondary | ICD-10-CM

## 2024-06-30 ENCOUNTER — Other Ambulatory Visit (HOSPITAL_COMMUNITY): Payer: Self-pay

## 2024-06-30 MED ORDER — VITAMIN D (ERGOCALCIFEROL) 1.25 MG (50000 UNIT) PO CAPS
50000.0000 [IU] | ORAL_CAPSULE | ORAL | 0 refills | Status: DC
  Filled 2024-06-30: qty 5, 35d supply, fill #0

## 2024-06-30 MED ORDER — LOSARTAN POTASSIUM 50 MG PO TABS
50.0000 mg | ORAL_TABLET | Freq: Every day | ORAL | 0 refills | Status: DC
  Filled 2024-06-30: qty 90, 90d supply, fill #0

## 2024-07-12 ENCOUNTER — Other Ambulatory Visit: Payer: Self-pay

## 2024-07-15 ENCOUNTER — Other Ambulatory Visit (HOSPITAL_COMMUNITY): Payer: Self-pay

## 2024-07-15 ENCOUNTER — Telehealth: Admitting: Physician Assistant

## 2024-07-15 DIAGNOSIS — B001 Herpesviral vesicular dermatitis: Secondary | ICD-10-CM | POA: Diagnosis not present

## 2024-07-15 MED ORDER — VALACYCLOVIR HCL 1 G PO TABS
2000.0000 mg | ORAL_TABLET | Freq: Two times a day (BID) | ORAL | 0 refills | Status: AC
  Filled 2024-07-15 – 2024-07-26 (×2): qty 4, 1d supply, fill #0

## 2024-07-15 NOTE — Progress Notes (Signed)
 We are sorry that you are not feeling well.  Here is how we plan to help!  Based on what you have shared with me it does look like you have a viral infection.    Most cold sores or fever blisters are small fluid filled blisters around the mouth caused by herpes simplex virus.  The most common strain of the virus causing cold sores is herpes simplex virus 1.  It can be spread by skin contact, sharing eating utensils, or even sharing towels.  Cold sores are contagious to other people until dry. (Approximately 5-7 days).  Wash your hands. You can spread the virus to your eyes through handling your contact lenses after touching the lesions.  Most people experience pain at the sight or tingling sensations in their lips that may begin before the ulcers erupt.  Herpes simplex is treatable but not curable.  It may lie dormant for a long time and then reappear due to stress or prolonged sun exposure.  Many patients have success in treating their cold sores with an over the counter topical called Abreva.  You may apply the cream up to 5 times daily (maximum 10 days) until healing occurs.  If you would like to use an oral antiviral medication to speed the healing of your cold sore, I have sent a prescription to your local pharmacy Valacyclovir  2 gm take one by mouth twice a day for 1 day    Unfortunately, since we are an Urgent Care department we are only allowed to treat the acute issue. For suppressive therapy, or to have standing acute treatments, you will need to follow up with your Primary Care Provider/Office.   If you do not have a PCP, [facility] offers a free physician referral service available at [phone]. Our trained staff has the experience, knowledge and resources to put you in touch with a physician who is right for you.   HOME CARE:  Wash your hands frequently. Do not pick at or rub the sore. Don't open the blisters. Avoid kissing other people during this time. Avoid sharing  drinking glasses, eating utensils, or razors. Do not handle contact lenses unless you have thoroughly washed your hands with soap and warm water! Avoid oral sex during this time.  Herpes from sores on your mouth can spread to your partner's genital area. Avoid contact with anyone who has eczema or a weakened immune system. Cold sores are often triggered by exposure to intense sunlight, use a lip balm containing a sunscreen (SPF 30 or higher).  GET HELP RIGHT AWAY IF:  Blisters look infected. Blisters occur near or in the eye. Symptoms last longer than 10 days. Your symptoms become worse.  MAKE SURE YOU:  Understand these instructions. Will watch your condition. Will get help right away if you are not doing well or get worse.    Your e-visit answers were reviewed by a board certified advanced clinical practitioner to complete your personal care plan.  Depending upon the condition, your plan could have  Included both over the counter or prescription medications.    Please review your pharmacy choice.  Be sure that the pharmacy you have chosen is open so that you can pick up your prescription now.  If there is a problem you can message your provider in MyChart to have the prescription routed to another pharmacy.    Your safety is important to us .  If you have drug allergies check our prescription carefully.  For the next 24  hours you can use MyChart to ask questions about today's visit, request a non-urgent call back, or ask for a work or school excuse from your e-visit provider.  You will get an email in the next two days asking about your experience.  I hope that your e-visit has been valuable and will speed your recovery.   I have spent 5 minutes in review of e-visit questionnaire, review and updating patient chart, medical decision making and response to patient.   Delon CHRISTELLA Dickinson, PA-C

## 2024-07-18 ENCOUNTER — Encounter: Payer: Self-pay | Admitting: Hematology

## 2024-07-19 ENCOUNTER — Other Ambulatory Visit (HOSPITAL_COMMUNITY): Payer: Self-pay

## 2024-07-26 ENCOUNTER — Other Ambulatory Visit (HOSPITAL_COMMUNITY): Payer: Self-pay

## 2024-07-28 ENCOUNTER — Ambulatory Visit: Admitting: Nurse Practitioner

## 2024-07-28 ENCOUNTER — Encounter: Payer: Self-pay | Admitting: Nurse Practitioner

## 2024-07-28 VITALS — BP 145/83 | HR 61 | Ht 69.0 in | Wt 256.0 lb

## 2024-07-28 DIAGNOSIS — E559 Vitamin D deficiency, unspecified: Secondary | ICD-10-CM

## 2024-07-28 DIAGNOSIS — Z79899 Other long term (current) drug therapy: Secondary | ICD-10-CM

## 2024-07-28 DIAGNOSIS — K760 Fatty (change of) liver, not elsewhere classified: Secondary | ICD-10-CM

## 2024-07-28 DIAGNOSIS — E66812 Obesity, class 2: Secondary | ICD-10-CM

## 2024-07-28 DIAGNOSIS — I10 Essential (primary) hypertension: Secondary | ICD-10-CM | POA: Diagnosis not present

## 2024-07-28 DIAGNOSIS — Z6837 Body mass index (BMI) 37.0-37.9, adult: Secondary | ICD-10-CM

## 2024-07-28 DIAGNOSIS — E65 Localized adiposity: Secondary | ICD-10-CM

## 2024-07-28 NOTE — Progress Notes (Signed)
 Office: [phone]  /  Fax: [phone]  WEIGHT SUMMARY AND BIOMETRICS  Weight Lost Since Last Visit: 3lb  Weight Gained Since Last Visit: 0   Vitals BP: (!) 145/83 Pulse Rate: 61 SpO2: 100 %   Anthropometric Measurements Height: 5' 9 (1.753 m) Weight: 256 lb (116.1 kg) BMI (Calculated): 37.79 Weight at Last Visit: 259LB Weight Lost Since Last Visit: 3lb Weight Gained Since Last Visit: 0 Starting Weight: 284LB Total Weight Loss (lbs): 28 lb (12.7 kg)   Body Composition  Body Fat %: 41.9 % Fat Mass (lbs): 107.6 lbs Muscle Mass (lbs): 141.6 lbs Total Body Water (lbs): 97 lbs Visceral Fat Rating : 10   Other Clinical Data Fasting: NO Labs: NO Today's Visit #: 35 Starting Date: 11/20/21     HPI  Chief Complaint: OBESITY  Adrianna is here to discuss her progress with her obesity treatment plan. She is on the keeping a food journal and adhering to recommended goals of 1900 calories and 60-70 protein and states she is following her eating plan approximately 80-85 % of the time. She states she is exercising 30 minutes 3-7 days per week.   Interval History:  Since last office visit she has lost 3 pounds.  She is not skipping meals. She is trying to meal prep. She stopped drinking her protein shake for BF daily and is now eating a yogurt and protein bar daily.  She is eating a chicken with salad daily. She is making healthier choices and watching her portion sizes.  She is drinking an energy drink, electrolytes and water daily.   She is running 3 days per week and walking 1 mile every day.  She has been walking more since her last visit.  She is averaging over 7,000 steps daily.  Recently signed up for another walking challenge and is aiming for over 9,000 steps daily.   Highest weight was 330 lbs.    Pharmacotherapy for weight loss: She is currently taking Zepbound  10 mg for medical weight loss.  Denies side effects. Has helped with polyphagia and cravings.      Previous pharmacotherapy for medical weight loss:  Ozempic -stopped due to cost     Bariatric surgery:  Patient has not had bariatric surgery.    Vit D deficiency  She is taking Vit D 50,000 IU weekly.  Denies side effects.  Denies nausea, vomiting or muscle weakness.    Lab Results  Component Value Date   VD25OH 34.7 11/23/2023   VD25OH 29.7 (L) 02/12/2023   VD25OH 27.3 (L) 07/30/2022     Hypertension Hypertension has been consistently elevated since June.  After discussion, she is drinking LMNT daily.  It has 1000 mg sodium.  I will have her stop that and reevaluate her BP at her next visit.  Medication(s): Cozaar  50mg .  Denies side effects.  Denies chest pain, palpitations and SOB.  BP Readings from Last 3 Encounters:  07/28/24 (!) 145/83  06/21/24 (!) 141/84  05/30/24 (!) 150/96   Lab Results  Component Value Date   CREATININE 0.68 11/23/2023   CREATININE 0.90 02/12/2023   CREATININE 0.72 07/30/2022    Fatty liver Has seen GI in the past.  Patient reports was confirmed years ago in [place] when she was seeing GI Last CMP was 11/23/23    Abdominal pannus  Struggles with bleeding, yeast, infection, etc.  Uses OTC creams, etc.  Notes it is getting worse.    PHYSICAL EXAM:  Blood pressure (!) 145/83, pulse 61, height  5' 9 (1.753 m), weight 256 lb (116.1 kg), SpO2 100%. Body mass index is 37.8 kg/m.  General: She is overweight, cooperative, alert, well developed, and in no acute distress. PSYCH: Has normal mood, affect and thought process.   Extremities: No edema.  Neurologic: No gross sensory or motor deficits. No tremors or fasciculations noted.    DIAGNOSTIC DATA REVIEWED:  BMET    Component Value Date/Time   NA 138 11/23/2023 1059   NA 140 08/21/2016 1328   K 4.3 11/23/2023 1059   K 4.0 08/21/2016 1328   CL 104 11/23/2023 1059   CO2 21 11/23/2023 1059   CO2 23 08/21/2016 1328   GLUCOSE 85 11/23/2023 1059   GLUCOSE 113 (H) 07/21/2019 0947   GLUCOSE 92  08/21/2016 1328   BUN 9 11/23/2023 1059   BUN 11.7 08/21/2016 1328   CREATININE 0.68 11/23/2023 1059   CREATININE 0.65 11/01/2018 0830   CREATININE 0.7 08/21/2016 1328   CALCIUM  8.8 11/23/2023 1059   CALCIUM  8.7 08/21/2016 1328   GFRNONAA >60 07/21/2019 0947   GFRNONAA >60 11/01/2018 0830   GFRAA >60 07/21/2019 0947   GFRAA >60 11/01/2018 0830   Lab Results  Component Value Date   HGBA1C 5.1 11/23/2023   HGBA1C 5.8 (H) 04/17/2021   Lab Results  Component Value Date   INSULIN  24.5 11/23/2023   INSULIN  17.3 11/20/2021   Lab Results  Component Value Date   TSH 1.850 11/23/2023   CBC    Component Value Date/Time   WBC 9.2 11/23/2023 1059   WBC 9.2 07/21/2019 0947   RBC 4.46 11/23/2023 1059   RBC 4.37 07/21/2019 0947   HGB 13.3 11/23/2023 1059   HGB 7.0 (L) 08/21/2016 1328   HCT 38.3 11/23/2023 1059   HCT 22.5 (L) 08/21/2016 1328   PLT 290 11/23/2023 1059   MCV 86 11/23/2023 1059   MCV 74.8 (L) 08/21/2016 1328   MCH 29.8 11/23/2023 1059   MCH 24.3 (L) 07/21/2019 0947   MCHC 34.7 11/23/2023 1059   MCHC 30.5 07/21/2019 0947   RDW 12.1 11/23/2023 1059   RDW 14.2 08/21/2016 1328   Iron  Studies    Component Value Date/Time   IRON  61 03/24/2022 1021   IRON  14 (L) 08/21/2016 1328   TIBC 428 08/09/2018 0943   TIBC 459 (H) 08/21/2016 1328   FERRITIN 30 08/09/2018 0943   FERRITIN 10 08/21/2016 1328   IRONPCTSAT 17 (L) 08/09/2018 0943   IRONPCTSAT 3 (L) 08/21/2016 1328   Lipid Panel     Component Value Date/Time   CHOL 154 11/23/2023 1059   TRIG 87 11/23/2023 1059   HDL 40 11/23/2023 1059   LDLCALC 97 11/23/2023 1059   Hepatic Function Panel     Component Value Date/Time   PROT 6.6 11/23/2023 1059   PROT 6.7 08/21/2016 1328   ALBUMIN 4.4 11/23/2023 1059   ALBUMIN 3.6 08/21/2016 1328   AST 22 11/23/2023 1059   AST 10 (L) 11/01/2018 0830   AST 60 (H) 08/21/2016 1328   ALT 17 11/23/2023 1059   ALT 11 11/01/2018 0830   ALT 87 (H) 08/21/2016 1328   ALKPHOS  63 11/23/2023 1059   ALKPHOS 70 08/21/2016 1328   BILITOT 0.6 11/23/2023 1059   BILITOT 0.2 (L) 11/01/2018 0830   BILITOT 0.54 08/21/2016 1328   BILIDIR <0.1 07/21/2019 0947   IBILI NOT CALCULATED 07/21/2019 0947      Component Value Date/Time   TSH 1.850 11/23/2023 1059   Nutritional Lab Results  Component Value Date   VD25OH 34.7 11/23/2023   VD25OH 29.7 (L) 02/12/2023   VD25OH 27.3 (L) 07/30/2022     ASSESSMENT AND PLAN  TREATMENT PLAN FOR OBESITY:  Recommended Dietary Goals  Frona is currently in the action stage of change. As such, her goal is to continue weight management plan. She has agreed to keeping a food journal and adhering to recommended goals of 1700-1900 calories and 100+ grams of protein.  Will adjust her calories based upon her running/miles.  Behavioral Intervention  We discussed the following Behavioral Modification Strategies today: increasing lean protein intake to established goals, decreasing simple carbohydrates , increasing vegetables, increasing fiber rich foods, increasing water intake , work on meal planning and preparation, work on tracking and journaling calories using tracking application, reading food labels , keeping healthy foods at home, avoiding temptations and identifying enticing environmental cues, continue to work on implementation of reduced calorie nutritional plan, continue to practice mindfulness when eating, planning for success, continue to work on maintaining a reduced calorie state, getting the recommended amount of protein, incorporating whole foods, making healthy choices, staying well hydrated and practicing mindfulness when eating., and increase protein intake, fibrous foods (25 grams per day for women, 30 grams for men) and water to improve satiety and decrease hunger signals. .  Additional resources provided today: NA  Recommended Physical Activity Goals  Kately has been advised to work up to 150 minutes of moderate intensity  aerobic activity a week and strengthening exercises 2-3 times per week for cardiovascular health, weight loss maintenance and preservation of muscle mass.   She has agreed to Think about enjoyable ways to increase daily physical activity and overcoming barriers to exercise, Increase physical activity in their day and reduce sedentary time (increase NEAT)., Start strengthening exercises with a goal of 2-3 sessions a week , Continue to gradually increase the amount and intensity of exercise routine, and Combine aerobic and strengthening exercises for efficiency and improved cardiometabolic health.   Pharmacotherapy We discussed various medication options to help Oliana with her weight loss efforts and we both agreed to continue Zepbound  10mg .  Side effects discussed.  ASSOCIATED CONDITIONS ADDRESSED TODAY  Action/Plan  Vitamin D  deficiency -     VITAMIN D  25 Hydroxy (Vit-D Deficiency, Fractures)  Fatty liver -     CBC with Differential/Platelet -     Comprehensive metabolic panel with GFR  Refer to GI  Essential hypertension Her BP has been elevated on multiple visits.  She is taking Cozaar  daily.  She started drinking an electrolyte drink in June that has 1,000mg  of sodium in it along with an energy drink daily.  I've asked her to stop both and reevaluate her BP at her next visit.  She doesn't want to stop her energy drink but is willing to find another electrolyte drink with less sodium.  If it is still elevated at her next visit, I will discuss additional treatment options.    Abdominal pannus Will continue to monitor.     Medication management -     Comprehensive metabolic panel with GFR  Obesity, Class II, BMI 35-39.9 Continue Zepbound     Return in about 4 weeks (around 08/25/2024).SABRA She was informed of the importance of frequent follow up visits to maximize her success with intensive lifestyle modifications for her multiple health conditions.   ATTESTASTION  STATEMENTS:  Reviewed by clinician on day of visit: allergies, medications, problem list, medical history, surgical history, family history, social history, and previous encounter  notes.     Corean SAUNDERS. Traevion Poehler FNP-C

## 2024-07-29 LAB — CBC WITH DIFFERENTIAL/PLATELET
Basophils Absolute: 0.1 x10E3/uL (ref 0.0–0.2)
Basos: 1 %
EOS (ABSOLUTE): 0.4 x10E3/uL (ref 0.0–0.4)
Eos: 4 %
Hematocrit: 41.7 % (ref 34.0–46.6)
Hemoglobin: 14.1 g/dL (ref 11.1–15.9)
Immature Grans (Abs): 0 x10E3/uL (ref 0.0–0.1)
Immature Granulocytes: 0 %
Lymphocytes Absolute: 2.8 x10E3/uL (ref 0.7–3.1)
Lymphs: 25 %
MCH: 29.9 pg (ref 26.6–33.0)
MCHC: 33.8 g/dL (ref 31.5–35.7)
MCV: 89 fL (ref 79–97)
Monocytes Absolute: 0.7 x10E3/uL (ref 0.1–0.9)
Monocytes: 6 %
Neutrophils Absolute: 7.2 x10E3/uL — ABNORMAL HIGH (ref 1.4–7.0)
Neutrophils: 64 %
Platelets: 303 x10E3/uL (ref 150–450)
RBC: 4.71 x10E6/uL (ref 3.77–5.28)
RDW: 12.6 % (ref 11.7–15.4)
WBC: 11.2 x10E3/uL — ABNORMAL HIGH (ref 3.4–10.8)

## 2024-07-29 LAB — COMPREHENSIVE METABOLIC PANEL WITH GFR
ALT: 14 IU/L (ref 0–32)
AST: 17 IU/L (ref 0–40)
Albumin: 4.3 g/dL (ref 3.9–4.9)
Alkaline Phosphatase: 63 IU/L (ref 41–116)
BUN/Creatinine Ratio: 18 (ref 9–23)
BUN: 12 mg/dL (ref 6–20)
Bilirubin Total: 0.6 mg/dL (ref 0.0–1.2)
CO2: 21 mmol/L (ref 20–29)
Calcium: 9.4 mg/dL (ref 8.7–10.2)
Chloride: 103 mmol/L (ref 96–106)
Creatinine, Ser: 0.67 mg/dL (ref 0.57–1.00)
Globulin, Total: 2.5 g/dL (ref 1.5–4.5)
Glucose: 77 mg/dL (ref 70–99)
Potassium: 4.4 mmol/L (ref 3.5–5.2)
Sodium: 140 mmol/L (ref 134–144)
Total Protein: 6.8 g/dL (ref 6.0–8.5)
eGFR: 114 mL/min/1.73 (ref >59)

## 2024-07-29 LAB — VITAMIN D 25 HYDROXY (VIT D DEFICIENCY, FRACTURES): Vit D, 25-Hydroxy: 37.9 ng/mL (ref 30.0–100.0)

## 2024-08-14 ENCOUNTER — Telehealth: Admitting: Family

## 2024-08-14 ENCOUNTER — Other Ambulatory Visit (HOSPITAL_COMMUNITY): Payer: Self-pay

## 2024-08-14 DIAGNOSIS — B002 Herpesviral gingivostomatitis and pharyngotonsillitis: Secondary | ICD-10-CM

## 2024-08-14 MED ORDER — VALACYCLOVIR HCL 1 G PO TABS
2000.0000 mg | ORAL_TABLET | Freq: Two times a day (BID) | ORAL | 0 refills | Status: AC
  Filled 2024-08-14: qty 4, 1d supply, fill #0

## 2024-08-14 NOTE — Progress Notes (Signed)
 We are sorry that you are not feeling well.  Here is how we plan to help!  Based on what you have shared with me it does look like you have a viral infection.    Most cold sores or fever blisters are small fluid filled blisters around the mouth caused by herpes simplex virus.  The most common strain of the virus causing cold sores is herpes simplex virus 1.  It can be spread by skin contact, sharing eating utensils, or even sharing towels.  Cold sores are contagious to other people until dry. (Approximately 5-7 days).  Wash your hands. You can spread the virus to your eyes through handling your contact lenses after touching the lesions.  Most people experience pain at the sight or tingling sensations in their lips that may begin before the ulcers erupt.  Herpes simplex is treatable but not curable.  It may lie dormant for a long time and then reappear due to stress or prolonged sun exposure.  Many patients have success in treating their cold sores with an over the counter topical called Abreva.  You may apply the cream up to 5 times daily (maximum 10 days) until healing occurs.  If you would like to use an oral antiviral medication to speed the healing of your cold sore, I have sent a prescription to your local pharmacy Valacyclovir  2 gm take one by mouth twice a day for 1 day    HOME CARE:  Wash your hands frequently. Do not pick at or rub the sore. Don't open the blisters. Avoid kissing other people during this time. Avoid sharing drinking glasses, eating utensils, or razors. Do not handle contact lenses unless you have thoroughly washed your hands with soap and warm water! Avoid oral sex during this time.  Herpes from sores on your mouth can spread to your partner's genital area. Avoid contact with anyone who has eczema or a weakened immune system. Cold sores are often triggered by exposure to intense sunlight, use a lip balm containing a sunscreen (SPF 30 or higher).  GET HELP RIGHT AWAY  IF:  Blisters look infected. Blisters occur near or in the eye. Symptoms last longer than 10 days. Your symptoms become worse.  MAKE SURE YOU:  Understand these instructions. Will watch your condition. Will get help right away if you are not doing well or get worse.    Your e-visit answers were reviewed by a board certified advanced clinical practitioner to complete your personal care plan.  Depending upon the condition, your plan could have  Included both over the counter or prescription medications.    Please review your pharmacy choice.  Be sure that the pharmacy you have chosen is open so that you can pick up your prescription now.  If there is a problem you can message your provider in MyChart to have the prescription routed to another pharmacy.    Your safety is important to us .  If you have drug allergies check our prescription carefully.  For the next 24 hours you can use MyChart to ask questions about today's visit, request a non-urgent call back, or ask for a work or school excuse from your e-visit provider.  You will get an email in the next two days asking about your experience.  I hope that your e-visit has been valuable and will speed your recovery.   I have spent 5 minutes in review of e-visit questionnaire, review and updating patient chart, medical decision making and response to patient.  Bari Learn, FNP

## 2024-08-18 ENCOUNTER — Telehealth: Admitting: Family Medicine

## 2024-08-18 ENCOUNTER — Other Ambulatory Visit (HOSPITAL_COMMUNITY): Payer: Self-pay

## 2024-08-18 DIAGNOSIS — B001 Herpesviral vesicular dermatitis: Secondary | ICD-10-CM

## 2024-08-18 MED ORDER — VALACYCLOVIR HCL 1 G PO TABS
2000.0000 mg | ORAL_TABLET | Freq: Two times a day (BID) | ORAL | 0 refills | Status: AC
  Filled 2024-08-18: qty 4, 1d supply, fill #0

## 2024-08-18 NOTE — Progress Notes (Signed)
 We are sorry that you are not feeling well.  Here is how we plan to help!  Based on what you have shared with me it does look like you have a viral infection.    Most cold sores or fever blisters are small fluid filled blisters around the mouth caused by herpes simplex virus.  The most common strain of the virus causing cold sores is herpes simplex virus 1.  It can be spread by skin contact, sharing eating utensils, or even sharing towels.  Cold sores are contagious to other people until dry. (Approximately 5-7 days).  Wash your hands. You can spread the virus to your eyes through handling your contact lenses after touching the lesions.  Most people experience pain at the sight or tingling sensations in their lips that may begin before the ulcers erupt.  Herpes simplex is treatable but not curable.  It may lie dormant for a long time and then reappear due to stress or prolonged sun exposure.  Many patients have success in treating their cold sores with an over the counter topical called Abreva.  You may apply the cream up to 5 times daily (maximum 10 days) until healing occurs.  If you would like to use an oral antiviral medication to speed the healing of your cold sore, I have sent a prescription to your local pharmacy Valacyclovir  2 gm take one by mouth twice a day for 1 day    HOME CARE:  Wash your hands frequently. Do not pick at or rub the sore. Don't open the blisters. Avoid kissing other people during this time. Avoid sharing drinking glasses, eating utensils, or razors. Do not handle contact lenses unless you have thoroughly washed your hands with soap and warm water ! Avoid oral sex during this time.  Herpes from sores on your mouth can spread to your partner's genital area. Avoid contact with anyone who has eczema or a weakened immune system. Cold sores are often triggered by exposure to intense sunlight, use a lip balm containing a sunscreen (SPF 30 or higher).  GET HELP RIGHT AWAY  IF:  Blisters look infected. Blisters occur near or in the eye. Symptoms last longer than 10 days. Your symptoms become worse.  MAKE SURE YOU:  Understand these instructions. Will watch your condition. Will get help right away if you are not doing well or get worse.    Your e-visit answers were reviewed by a board certified advanced clinical practitioner to complete your personal care plan.  Depending upon the condition, your plan could have  Included both over the counter or prescription medications.    Please review your pharmacy choice.  Be sure that the pharmacy you have chosen is open so that you can pick up your prescription now.  If there is a problem you can message your provider in MyChart to have the prescription routed to another pharmacy.    Your safety is important to us .  If you have drug allergies check our prescription carefully.  For the next 24 hours you can use MyChart to ask questions about today's visit, request a non-urgent call back, or ask for a work or school excuse from your e-visit provider.  You will get an email in the next two days asking about your experience.  I hope that your e-visit has been valuable and will speed your recovery.   I have spent 5 minutes in review of e-visit questionnaire, review and updating patient chart, medical decision making and response to patient.  Fayelynn Distel, FNP

## 2024-08-24 ENCOUNTER — Ambulatory Visit: Admitting: Nurse Practitioner

## 2024-08-25 ENCOUNTER — Encounter: Payer: Self-pay | Admitting: Nurse Practitioner

## 2024-08-25 ENCOUNTER — Other Ambulatory Visit (HOSPITAL_COMMUNITY): Payer: Self-pay

## 2024-08-25 ENCOUNTER — Telehealth (HOSPITAL_COMMUNITY): Payer: Self-pay

## 2024-08-25 ENCOUNTER — Ambulatory Visit (INDEPENDENT_AMBULATORY_CARE_PROVIDER_SITE_OTHER): Admitting: Nurse Practitioner

## 2024-08-25 ENCOUNTER — Encounter (HOSPITAL_COMMUNITY): Payer: Self-pay

## 2024-08-25 VITALS — BP 136/83 | HR 71 | Ht 69.0 in | Wt 256.0 lb

## 2024-08-25 DIAGNOSIS — K760 Fatty (change of) liver, not elsewhere classified: Secondary | ICD-10-CM

## 2024-08-25 DIAGNOSIS — B001 Herpesviral vesicular dermatitis: Secondary | ICD-10-CM

## 2024-08-25 DIAGNOSIS — E559 Vitamin D deficiency, unspecified: Secondary | ICD-10-CM

## 2024-08-25 DIAGNOSIS — Z6837 Body mass index (BMI) 37.0-37.9, adult: Secondary | ICD-10-CM

## 2024-08-25 DIAGNOSIS — E66812 Obesity, class 2: Secondary | ICD-10-CM | POA: Diagnosis not present

## 2024-08-25 DIAGNOSIS — B002 Herpesviral gingivostomatitis and pharyngotonsillitis: Secondary | ICD-10-CM

## 2024-08-25 MED ORDER — VALACYCLOVIR HCL 1 G PO TABS
2000.0000 mg | ORAL_TABLET | Freq: Two times a day (BID) | ORAL | 1 refills | Status: AC | PRN
  Filled 2024-08-25: qty 30, 8d supply, fill #0

## 2024-08-25 MED ORDER — VITAMIN D (ERGOCALCIFEROL) 1.25 MG (50000 UNIT) PO CAPS
50000.0000 [IU] | ORAL_CAPSULE | ORAL | 0 refills | Status: DC
  Filled 2024-08-25: qty 5, 35d supply, fill #0

## 2024-08-25 MED ORDER — ZEPBOUND 10 MG/0.5ML ~~LOC~~ SOAJ
10.0000 mg | SUBCUTANEOUS | 0 refills | Status: DC
  Filled 2024-08-25: qty 2, 28d supply, fill #0

## 2024-08-25 NOTE — Progress Notes (Addendum)
 Office: [phone]  /  Fax: [phone]  WEIGHT SUMMARY AND BIOMETRICS  Weight Lost Since Last Visit: 0  Weight Gained Since Last Visit: 0   Vitals BP: 136/83 Pulse Rate: 71 SpO2: 99 %   Anthropometric Measurements Height: 5' 9 (1.753 m) Weight: 256 lb (116.1 kg) BMI (Calculated): 37.79 Weight at Last Visit: 256lb Weight Lost Since Last Visit: 0 Weight Gained Since Last Visit: 0 Starting Weight: 284lb Total Weight Loss (lbs): 28 lb (12.7 kg)   Body Composition  Body Fat %: 41.9 % Fat Mass (lbs): 107.2 lbs Muscle Mass (lbs): 141.4 lbs Total Body Water (lbs): 96 lbs Visceral Fat Rating : 10   Other Clinical Data Fasting: no Labs: no Today's Visit #: 36 Starting Date: 11/20/21     HPI  Chief Complaint: OBESITY  Maudine is here to discuss her progress with her obesity treatment plan. She is on the keeping a food journal and adhering to recommended goals of 1900 calories and 60-70 protein and states she is following her eating plan approximately 80-85 % of the time. She states she is exercising 30 minutes 3 days per week.   Interval History:  Since last office visit she has maintained her weight.  She is doing well with her meal plan and following it 80-85% of the time.  She hasn't been able to run over the past 2 weeks. She has been under more stress and working longer hours.  She has been stress eating. She saw GI yesterday for evaluation for fatty liver.  She is drinking water and electrolytes when running. Denies alcohol intake.  She is running 3 days per week.    Highest weight was 330 lbs.     Pharmacotherapy for weight loss: She is currently taking Zepbound  10 mgfor medical weight loss.  Denies side effects.    Previous pharmacotherapy for medical weight loss:  Ozempic -stopped due to cost     Bariatric surgery:  Patient has not had bariatric surgery.   Fatty liver She saw GI yesterday.  Fibroscan obtained 08/24/24 showing FibroScan completed.   Results for FibroScan are:   S1F1 (also see GI notes with results stated) which meets medicaid requirements for Wegovy  coverage.     Fibrosis 4 Score = .58 (Low risk)        Interpretation for patients with NAFLD          <1.30       -  F0-F1 (Low risk)          1.30-2.67 -  Indeterminate           >2.67      -  F3-F4 (High risk)     Validated for ages 23-65         Vit D deficiency  She is taking Vit D 50,000 IU weekly.  Denies side effects.  Denies nausea, vomiting or muscle weakness.    Lab Results  Component Value Date   VD25OH 37.9 07/28/2024   VD25OH 34.7 11/23/2023   VD25OH 29.7 (L) 02/12/2023    Hypertension She is being seen monthly Medication(s): Cozaar  50mg . Denies side effects  Denies chest pain, palpitations and SOB.  BP Readings from Last 3 Encounters:  08/25/24 136/83  07/28/24 (!) 145/83  06/21/24 (!) 141/84   Lab Results  Component Value Date   CREATININE 0.67 07/28/2024   CREATININE 0.68 11/23/2023   CREATININE 0.90 02/12/2023       PHYSICAL EXAM:  Blood pressure 136/83, pulse 71, height 5' 9 (  1.753 m), weight 256 lb (116.1 kg), SpO2 99%. Body mass index is 37.8 kg/m.  General: She is overweight, cooperative, alert, well developed, and in no acute distress. PSYCH: Has normal mood, affect and thought process.   Extremities: No edema.  Neurologic: No gross sensory or motor deficits. No tremors or fasciculations noted.    DIAGNOSTIC DATA REVIEWED:  BMET    Component Value Date/Time   NA 140 07/28/2024 1059   NA 140 08/21/2016 1328   K 4.4 07/28/2024 1059   K 4.0 08/21/2016 1328   CL 103 07/28/2024 1059   CO2 21 07/28/2024 1059   CO2 23 08/21/2016 1328   GLUCOSE 77 07/28/2024 1059   GLUCOSE 113 (H) 07/21/2019 0947   GLUCOSE 92 08/21/2016 1328   BUN 12 07/28/2024 1059   BUN 11.7 08/21/2016 1328   CREATININE 0.67 07/28/2024 1059   CREATININE 0.65 11/01/2018 0830   CREATININE 0.7 08/21/2016 1328   CALCIUM  9.4 07/28/2024 1059   CALCIUM   8.7 08/21/2016 1328   GFRNONAA >60 07/21/2019 0947   GFRNONAA >60 11/01/2018 0830   GFRAA >60 07/21/2019 0947   GFRAA >60 11/01/2018 0830   Lab Results  Component Value Date   HGBA1C 5.1 11/23/2023   HGBA1C 5.8 (H) 04/17/2021   Lab Results  Component Value Date   INSULIN  24.5 11/23/2023   INSULIN  17.3 11/20/2021   Lab Results  Component Value Date   TSH 1.850 11/23/2023   CBC    Component Value Date/Time   WBC 11.2 (H) 07/28/2024 1059   WBC 9.2 07/21/2019 0947   RBC 4.71 07/28/2024 1059   RBC 4.37 07/21/2019 0947   HGB 14.1 07/28/2024 1059   HGB 7.0 (L) 08/21/2016 1328   HCT 41.7 07/28/2024 1059   HCT 22.5 (L) 08/21/2016 1328   PLT 303 07/28/2024 1059   MCV 89 07/28/2024 1059   MCV 74.8 (L) 08/21/2016 1328   MCH 29.9 07/28/2024 1059   MCH 24.3 (L) 07/21/2019 0947   MCHC 33.8 07/28/2024 1059   MCHC 30.5 07/21/2019 0947   RDW 12.6 07/28/2024 1059   RDW 14.2 08/21/2016 1328   Iron  Studies    Component Value Date/Time   IRON  61 03/24/2022 1021   IRON  14 (L) 08/21/2016 1328   TIBC 428 08/09/2018 0943   TIBC 459 (H) 08/21/2016 1328   FERRITIN 30 08/09/2018 0943   FERRITIN 10 08/21/2016 1328   IRONPCTSAT 17 (L) 08/09/2018 0943   IRONPCTSAT 3 (L) 08/21/2016 1328   Lipid Panel     Component Value Date/Time   CHOL 154 11/23/2023 1059   TRIG 87 11/23/2023 1059   HDL 40 11/23/2023 1059   LDLCALC 97 11/23/2023 1059   Hepatic Function Panel     Component Value Date/Time   PROT 6.8 07/28/2024 1059   PROT 6.7 08/21/2016 1328   ALBUMIN 4.3 07/28/2024 1059   ALBUMIN 3.6 08/21/2016 1328   AST 17 07/28/2024 1059   AST 10 (L) 11/01/2018 0830   AST 60 (H) 08/21/2016 1328   ALT 14 07/28/2024 1059   ALT 11 11/01/2018 0830   ALT 87 (H) 08/21/2016 1328   ALKPHOS 63 07/28/2024 1059   ALKPHOS 70 08/21/2016 1328   BILITOT 0.6 07/28/2024 1059   BILITOT 0.2 (L) 11/01/2018 0830   BILITOT 0.54 08/21/2016 1328   BILIDIR <0.1 07/21/2019 0947   IBILI NOT CALCULATED  07/21/2019 0947      Component Value Date/Time   TSH 1.850 11/23/2023 1059   Nutritional Lab Results  Component Value Date   VD25OH 37.9 07/28/2024   VD25OH 34.7 11/23/2023   VD25OH 29.7 (L) 02/12/2023     ASSESSMENT AND PLAN  TREATMENT PLAN FOR OBESITY:  Recommended Dietary Goals  Asante is currently in the action stage of change. As such, her goal is to continue weight management plan. She has agreed to keeping a food journal and adhering to recommended goals of 1900 calories and 90+ grams of protein.  Behavioral Intervention  We discussed the following Behavioral Modification Strategies today: increasing lean protein intake to established goals, decreasing simple carbohydrates , increasing vegetables, increasing fiber rich foods, increasing water intake , work on meal planning and preparation, work on tracking and journaling calories using tracking application, reading food labels , keeping healthy foods at home, celebration eating strategies, continue to work on maintaining a reduced calorie state, getting the recommended amount of protein, incorporating whole foods, making healthy choices, staying well hydrated and practicing mindfulness when eating., and increase protein intake, fibrous foods (25 grams per day for women, 30 grams for men) and water to improve satiety and decrease hunger signals. .  Additional resources provided today: NA  Recommended Physical Activity Goals  Cadyn has been advised to work up to 150 minutes of moderate intensity aerobic activity a week and strengthening exercises 2-3 times per week for cardiovascular health, weight loss maintenance and preservation of muscle mass.   She has agreed to Continue current level of physical activity , Think about enjoyable ways to increase daily physical activity and overcoming barriers to exercise, Increase physical activity in their day and reduce sedentary time (increase NEAT)., Continue to gradually increase the  amount and intensity of exercise routine, Increase volume of physical activity to a goal of 240 minutes a week, and Combine aerobic and strengthening exercises for efficiency and improved cardiometabolic health.   Pharmacotherapy We discussed various medication options to help Corrinna with her weight loss efforts and we both agreed to continue Zepbound  10mg  for fatty liver.  Side effects discussed.  Stop Zepbound    Addendum:  sent in wegovy  1mg  for fatty liver.  Wegovy  is indicated for fatty liver.    ASSOCIATED CONDITIONS ADDRESSED TODAY  Action/Plan  Fatty liver Addendum:  Wegovy  1mg  prescribed for fatty liver  Stop Zepbound  to start Wegovy    Hypertension Continue Cozaar  50mg . Will continue to monitor.    Vitamin D  deficiency -     Vitamin D  (Ergocalciferol ); Take 1 capsule (50,000 Units total) by mouth every 7 (seven) days.  Dispense: 5 capsule; Refill: 0  Oral herpes -     Refill valACYclovir  HCl; Take 2 pills PO BID for one day PRN at the onset of an outbreak  Dispense: 30 tablet; Refill: 1.  Side effects discussed.    Patient requested refill   Obesity, Class II, BMI 35-39.9         Return in about 4 weeks (around 09/22/2024).SABRA She was informed of the importance of frequent follow up visits to maximize her success with intensive lifestyle modifications for her multiple health conditions.   ATTESTASTION STATEMENTS:  Reviewed by clinician on day of visit: allergies, medications, problem list, medical history, surgical history, family history, social history, and previous encounter notes.     Corean SAUNDERS. Zia Kanner FNP-C

## 2024-08-25 NOTE — Telephone Encounter (Signed)
 Pharmacy Patient Advocate Encounter   Received notification from Pt Calls Messages that prior authorization for Zepbound  10MG /0.5ML pen-injectors  is required/requested.   Insurance verification completed.   The patient is insured through [employer].   Per test claim: PA required; PA submitted to above mentioned insurance via Latent Key/confirmation #/EOC [id number] Status is pending

## 2024-08-25 NOTE — Telephone Encounter (Signed)
 PA request has been Received. New Encounter has been or will be created for follow up. For additional info see Pharmacy Prior Auth telephone encounter from 08/25/24.

## 2024-08-26 NOTE — Telephone Encounter (Signed)
 Pharmacy Patient Advocate Encounter  Received notification from [facility] [employer] that Prior Authorization for Zepbound  10MG /0.5ML pen-injectors  has been DENIED.  See denial reason below. No denial letter attached in CMM. Will attach denial letter to Media tab once received.   PA #/Case ID/Reference #: [id number]     *I spoke with one of our pharmacists and she said we might be able to get Wegovy  approved for [facility]. If you would like to change medication and have us  proceed with the prior auth for the Wegovy  please let us  know.

## 2024-08-29 ENCOUNTER — Encounter: Payer: Self-pay | Admitting: Nurse Practitioner

## 2024-08-29 ENCOUNTER — Other Ambulatory Visit (HOSPITAL_COMMUNITY): Payer: Self-pay

## 2024-08-29 ENCOUNTER — Other Ambulatory Visit: Payer: Self-pay | Admitting: Nurse Practitioner

## 2024-08-29 DIAGNOSIS — K760 Fatty (change of) liver, not elsewhere classified: Secondary | ICD-10-CM

## 2024-08-29 MED ORDER — WEGOVY 1 MG/0.5ML ~~LOC~~ SOAJ: 1.0000 mg | SUBCUTANEOUS | 0 refills | Status: DC

## 2024-08-29 NOTE — Telephone Encounter (Signed)
 Pharmacy Patient Advocate Encounter   Received notification from Pt Calls Messages that prior authorization for Wegovy  1MG /0.5ML auto-injectors  is required/requested.   Insurance verification completed.   The patient is insured through [employer].   Per test claim: PA required; PA submitted to above mentioned insurance via Latent Key/confirmation #/EOC [id number] Status is pending

## 2024-08-29 NOTE — Telephone Encounter (Signed)
 Pharmacy Patient Advocate Encounter  Received notification from [facility] [employer] that Prior Authorization for Wegovy  1MG /0.5ML auto-injectors  has been DENIED.  See denial reason below. No denial letter attached in CMM. Will attach denial letter to Media tab once received.   PA #/Case ID/Reference #: [id number]

## 2024-08-30 ENCOUNTER — Other Ambulatory Visit (HOSPITAL_COMMUNITY): Payer: Self-pay

## 2024-08-30 ENCOUNTER — Telehealth: Payer: Self-pay | Admitting: Pharmacist

## 2024-08-30 ENCOUNTER — Telehealth: Payer: Self-pay

## 2024-08-30 NOTE — Telephone Encounter (Signed)
 PA submitted for Wegovy . Faxed to [facility] at [phone]. Awaiting insurance determination

## 2024-08-30 NOTE — Telephone Encounter (Signed)
 Spoke with insurance to initiate an additional review. They requested additional information including alcohol use; FIB-4 results; Confirmation that being checked regularly and may be getting care for other health problems such as heart disease, diabetes, high blood pressure, or high cholesterol.FiberScan Results: Confirmation patient has not other liver disease or issues; being prescribed in conjunction with a specialist.  All information was typed and supporting documents were faxed to [phone].  Thank you, Devere Pandy, PharmD Clinical Pharmacist  [place]  Direct Dial: [phone]   Case# [id number]

## 2024-08-31 ENCOUNTER — Other Ambulatory Visit (HOSPITAL_COMMUNITY): Payer: Self-pay

## 2024-09-01 ENCOUNTER — Other Ambulatory Visit (HOSPITAL_COMMUNITY): Payer: Self-pay

## 2024-09-05 ENCOUNTER — Other Ambulatory Visit (HOSPITAL_COMMUNITY): Payer: Self-pay

## 2024-09-05 ENCOUNTER — Other Ambulatory Visit: Payer: Self-pay | Admitting: Nurse Practitioner

## 2024-09-05 DIAGNOSIS — K760 Fatty (change of) liver, not elsewhere classified: Secondary | ICD-10-CM

## 2024-09-05 MED ORDER — WEGOVY 1 MG/0.5ML ~~LOC~~ SOAJ
1.0000 mg | SUBCUTANEOUS | 0 refills | Status: DC
  Filled 2024-09-05: qty 2, 28d supply, fill #0
  Filled 2024-09-05: qty 6, 84d supply, fill #0

## 2024-09-05 NOTE — Telephone Encounter (Signed)
 Received fax from [facility] that Wegovy  has been approved from 08/31/24-03/01/25.

## 2024-10-04 ENCOUNTER — Other Ambulatory Visit (HOSPITAL_COMMUNITY): Payer: Self-pay

## 2024-10-04 ENCOUNTER — Ambulatory Visit: Admitting: Nurse Practitioner

## 2024-10-04 ENCOUNTER — Encounter: Payer: Self-pay | Admitting: Nurse Practitioner

## 2024-10-04 VITALS — BP 139/84 | HR 54 | Temp 98.0°F | Ht 69.0 in | Wt 264.0 lb

## 2024-10-04 DIAGNOSIS — K760 Fatty (change of) liver, not elsewhere classified: Secondary | ICD-10-CM | POA: Diagnosis not present

## 2024-10-04 DIAGNOSIS — Z6838 Body mass index (BMI) 38.0-38.9, adult: Secondary | ICD-10-CM

## 2024-10-04 DIAGNOSIS — E66812 Obesity, class 2: Secondary | ICD-10-CM

## 2024-10-04 DIAGNOSIS — E559 Vitamin D deficiency, unspecified: Secondary | ICD-10-CM | POA: Diagnosis not present

## 2024-10-04 DIAGNOSIS — I10 Essential (primary) hypertension: Secondary | ICD-10-CM

## 2024-10-04 MED ORDER — LOSARTAN POTASSIUM 50 MG PO TABS
50.0000 mg | ORAL_TABLET | Freq: Every day | ORAL | 0 refills | Status: AC
  Filled 2024-10-04: qty 90, 90d supply, fill #0

## 2024-10-04 MED ORDER — VITAMIN D (ERGOCALCIFEROL) 1.25 MG (50000 UNIT) PO CAPS
50000.0000 [IU] | ORAL_CAPSULE | ORAL | 0 refills | Status: AC
  Filled 2024-10-04: qty 5, 35d supply, fill #0

## 2024-10-04 MED ORDER — WEGOVY 1.7 MG/0.75ML ~~LOC~~ SOAJ
1.7000 mg | SUBCUTANEOUS | 0 refills | Status: AC
  Filled 2024-10-04: qty 3, 28d supply, fill #0

## 2024-10-04 NOTE — Progress Notes (Signed)
 "  Office: [phone]  /  Fax: [phone]  WEIGHT SUMMARY AND BIOMETRICS  Weight Lost Since Last Visit: 0lb  Weight Gained Since Last Visit: 8lb   Vitals Temp: 98 F (36.7 C) BP: 139/84 Pulse Rate: (!) 54 SpO2: 100 %   Anthropometric Measurements Height: 5' 9 (1.753 m) Weight: 264 lb (119.7 kg) BMI (Calculated): 38.97 Weight at Last Visit: 256lb Weight Lost Since Last Visit: 0lb Weight Gained Since Last Visit: 8lb Starting Weight: 284lb Total Weight Loss (lbs): 20 lb (9.072 kg)   Body Composition  Body Fat %: 43 % Fat Mass (lbs): 113.8 lbs Muscle Mass (lbs): 143.2 lbs Total Body Water (lbs): 99 lbs Visceral Fat Rating : 11   Other Clinical Data Fasting: No Labs: No Today's Visit #: 37 Starting Date: 11/20/21     HPI  Chief Complaint: OBESITY  Leslie Branch is here to discuss her progress with her obesity treatment plan. She is on the keeping a food journal and adhering to recommended goals of 1900 calories and 60-70 protein and states she is following her eating plan approximately 60-75 % of the time. She states she is exercising 30+ minutes 3-4 days per week.   Interval History:  Since last office visit she has gained 8 pounds.  She struggled with following her meal plan in December due to the holidays and stress at work.  Her work stress has decreased.  She is drinking water. She stopped her electrolytes since her last visit. She signed up for a running coach.  She is starting a new training plan this week.  She is running 2.5-9 miles 3 days per week.  This will change weekly based upon her training schedule.  She is running 4 marathons and a 50k this year. She is struggling with polyphagia and cravings.     Pharmacotherapy for weight loss: She is currently taking Wegovy  1mg  for medical weight loss.  Denies side effects.    Bariatric surgery:  Patient has not had bariatric surgery   Fatty liver She saw GI.  Fibroscan obtained 08/24/24 showing FibroScan  completed.  Results for FibroScan are:   S1F1 (also see GI notes with results stated) which meets medicaid requirements for Wegovy  coverage.      Fibrosis 4 Score = .58 (Low risk)        Interpretation for patients with NAFLD          <1.30       -  F0-F1 (Low risk)          1.30-2.67 -  Indeterminate           >2.67      -  F3-F4 (High risk)     Validated for ages 76-65          Hypertension Hypertension improved since stopping electrolytes .  Medication(s): Cozaar  50mg .  Denies side effects  Denies chest pain, palpitations and SOB.  BP Readings from Last 3 Encounters:  10/04/24 139/84  08/25/24 136/83  07/28/24 (!) 145/83   Lab Results  Component Value Date   CREATININE 0.67 07/28/2024   CREATININE 0.68 11/23/2023   CREATININE 0.90 02/12/2023    Vit D deficiency  She is taking Vit D 50,000 IU weekly.  Denies side effects.  Denies nausea, vomiting or muscle weakness.    Lab Results  Component Value Date   VD25OH 37.9 07/28/2024   VD25OH 34.7 11/23/2023   VD25OH 29.7 (L) 02/12/2023     PHYSICAL EXAM:  Blood pressure 139/84, pulse ROLLEN)  54, temperature 98 F (36.7 C), height 5' 9 (1.753 m), weight 264 lb (119.7 kg), SpO2 100%. Body mass index is 38.99 kg/m.  General: She is overweight, cooperative, alert, well developed, and in no acute distress. PSYCH: Has normal mood, affect and thought process.   Extremities: No edema.  Neurologic: No gross sensory or motor deficits. No tremors or fasciculations noted.    DIAGNOSTIC DATA REVIEWED:  BMET    Component Value Date/Time   NA 140 07/28/2024 1059   NA 140 08/21/2016 1328   K 4.4 07/28/2024 1059   K 4.0 08/21/2016 1328   CL 103 07/28/2024 1059   CO2 21 07/28/2024 1059   CO2 23 08/21/2016 1328   GLUCOSE 77 07/28/2024 1059   GLUCOSE 113 (H) 07/21/2019 0947   GLUCOSE 92 08/21/2016 1328   BUN 12 07/28/2024 1059   BUN 11.7 08/21/2016 1328   CREATININE 0.67 07/28/2024 1059   CREATININE 0.65 11/01/2018 0830    CREATININE 0.7 08/21/2016 1328   CALCIUM  9.4 07/28/2024 1059   CALCIUM  8.7 08/21/2016 1328   GFRNONAA >60 07/21/2019 0947   GFRNONAA >60 11/01/2018 0830   GFRAA >60 07/21/2019 0947   GFRAA >60 11/01/2018 0830   Lab Results  Component Value Date   HGBA1C 5.1 11/23/2023   HGBA1C 5.8 (H) 04/17/2021   Lab Results  Component Value Date   INSULIN  24.5 11/23/2023   INSULIN  17.3 11/20/2021   Lab Results  Component Value Date   TSH 1.850 11/23/2023   CBC    Component Value Date/Time   WBC 11.2 (H) 07/28/2024 1059   WBC 9.2 07/21/2019 0947   RBC 4.71 07/28/2024 1059   RBC 4.37 07/21/2019 0947   HGB 14.1 07/28/2024 1059   HGB 7.0 (L) 08/21/2016 1328   HCT 41.7 07/28/2024 1059   HCT 22.5 (L) 08/21/2016 1328   PLT 303 07/28/2024 1059   MCV 89 07/28/2024 1059   MCV 74.8 (L) 08/21/2016 1328   MCH 29.9 07/28/2024 1059   MCH 24.3 (L) 07/21/2019 0947   MCHC 33.8 07/28/2024 1059   MCHC 30.5 07/21/2019 0947   RDW 12.6 07/28/2024 1059   RDW 14.2 08/21/2016 1328   Iron  Studies    Component Value Date/Time   IRON  61 03/24/2022 1021   IRON  14 (L) 08/21/2016 1328   TIBC 428 08/09/2018 0943   TIBC 459 (H) 08/21/2016 1328   FERRITIN 30 08/09/2018 0943   FERRITIN 10 08/21/2016 1328   IRONPCTSAT 17 (L) 08/09/2018 0943   IRONPCTSAT 3 (L) 08/21/2016 1328   Lipid Panel     Component Value Date/Time   CHOL 154 11/23/2023 1059   TRIG 87 11/23/2023 1059   HDL 40 11/23/2023 1059   LDLCALC 97 11/23/2023 1059   Hepatic Function Panel     Component Value Date/Time   PROT 6.8 07/28/2024 1059   PROT 6.7 08/21/2016 1328   ALBUMIN 4.3 07/28/2024 1059   ALBUMIN 3.6 08/21/2016 1328   AST 17 07/28/2024 1059   AST 10 (L) 11/01/2018 0830   AST 60 (H) 08/21/2016 1328   ALT 14 07/28/2024 1059   ALT 11 11/01/2018 0830   ALT 87 (H) 08/21/2016 1328   ALKPHOS 63 07/28/2024 1059   ALKPHOS 70 08/21/2016 1328   BILITOT 0.6 07/28/2024 1059   BILITOT 0.2 (L) 11/01/2018 0830   BILITOT 0.54  08/21/2016 1328   BILIDIR <0.1 07/21/2019 0947   IBILI NOT CALCULATED 07/21/2019 0947      Component Value Date/Time   TSH  1.850 11/23/2023 1059   Nutritional Lab Results  Component Value Date   VD25OH 37.9 07/28/2024   VD25OH 34.7 11/23/2023   VD25OH 29.7 (L) 02/12/2023     ASSESSMENT AND PLAN  TREATMENT PLAN FOR OBESITY:  Recommended Dietary Goals  Ndea is currently in the action stage of change. As such, her goal is to continue weight management plan. She has agreed to keeping a food journal and adhering to recommended goals of 1900 calories and 90 + grams of protein.  Behavioral Intervention  We discussed the following Behavioral Modification Strategies today: increasing lean protein intake to established goals, decreasing simple carbohydrates , increasing vegetables, increasing fiber rich foods, increasing water intake , work on meal planning and preparation, work on tracking and journaling calories using tracking application, reading food labels , keeping healthy foods at home, planning for success, continue to work on maintaining a reduced calorie state, getting the recommended amount of protein, incorporating whole foods, making healthy choices, staying well hydrated and practicing mindfulness when eating., and increase protein intake, fibrous foods (25 grams per day for women, 30 grams for men) and water to improve satiety and decrease hunger signals. .  Additional resources provided today: NA  Recommended Physical Activity Goals  Blakley has been advised to work up to 150 minutes of moderate intensity aerobic activity a week and strengthening exercises 2-3 times per week for cardiovascular health, weight loss maintenance and preservation of muscle mass.   She has agreed to Think about enjoyable ways to increase daily physical activity and overcoming barriers to exercise, Increase physical activity in their day and reduce sedentary time (increase NEAT)., Continue to  gradually increase the amount and intensity of exercise routine, and Combine aerobic and strengthening exercises for efficiency and improved cardiometabolic health.   Pharmacotherapy We discussed various medication options to help Arwilda with her weight loss efforts and we both agreed to increase Wegovy  1.7mg .  side effects discussed.  ASSOCIATED CONDITIONS ADDRESSED TODAY  Action/Plan  Fatty liver -     Wegovy ; Inject 1.7 mg into the skin once a week.  Dispense: 3 mL; Refill: 0  Vitamin D  deficiency -     Vitamin D  (Ergocalciferol ); Take 1 capsule (50,000 Units total) by mouth every 7 (seven) days.  Dispense: 5 capsule; Refill: 0  Essential hypertension -     Losartan  Potassium; Take 1 tablet (50 mg total) by mouth daily.  Dispense: 90 tablet; Refill: 0  Obesity, Class II, BMI 35-39.9         Return in about 4 weeks (around 11/01/2024).SABRA She was informed of the importance of frequent follow up visits to maximize her success with intensive lifestyle modifications for her multiple health conditions.   ATTESTASTION STATEMENTS:  Reviewed by clinician on day of visit: allergies, medications, problem list, medical history, surgical history, family history, social history, and previous encounter notes.   Corean SAUNDERS. Zymir Napoli FNP-C "

## 2024-10-05 ENCOUNTER — Other Ambulatory Visit: Payer: Self-pay | Admitting: Nurse Practitioner

## 2024-11-02 ENCOUNTER — Ambulatory Visit: Admitting: Nurse Practitioner
